# Patient Record
Sex: Female | Born: 1940 | Race: White | Hispanic: No | Marital: Single | Smoking: Former smoker
Health system: Southern US, Community
[De-identification: ages and names within clinical notes are randomized; demographics above are authoritative.]

## PROBLEM LIST (undated history)

## (undated) DIAGNOSIS — R51 Headache: Secondary | ICD-10-CM

## (undated) DIAGNOSIS — K7460 Unspecified cirrhosis of liver: Secondary | ICD-10-CM

## (undated) DIAGNOSIS — E119 Type 2 diabetes mellitus without complications: Secondary | ICD-10-CM

## (undated) DIAGNOSIS — J209 Acute bronchitis, unspecified: Secondary | ICD-10-CM

## (undated) DIAGNOSIS — R0600 Dyspnea, unspecified: Secondary | ICD-10-CM

## (undated) DIAGNOSIS — E669 Obesity, unspecified: Secondary | ICD-10-CM

## (undated) DIAGNOSIS — E1100 Type 2 diabetes mellitus with hyperosmolarity without nonketotic hyperglycemic-hyperosmolar coma (NKHHC): Secondary | ICD-10-CM

## (undated) DIAGNOSIS — D649 Anemia, unspecified: Secondary | ICD-10-CM

## (undated) DIAGNOSIS — R188 Other ascites: Secondary | ICD-10-CM

## (undated) DIAGNOSIS — I10 Essential (primary) hypertension: Secondary | ICD-10-CM

## (undated) DIAGNOSIS — K922 Gastrointestinal hemorrhage, unspecified: Secondary | ICD-10-CM

## (undated) DIAGNOSIS — A3790 Whooping cough, unspecified species without pneumonia: Secondary | ICD-10-CM

## (undated) DIAGNOSIS — K8020 Calculus of gallbladder without cholecystitis without obstruction: Secondary | ICD-10-CM

## (undated) DIAGNOSIS — I8500 Esophageal varices without bleeding: Secondary | ICD-10-CM

## (undated) DIAGNOSIS — R011 Cardiac murmur, unspecified: Secondary | ICD-10-CM

## (undated) DIAGNOSIS — I499 Cardiac arrhythmia, unspecified: Secondary | ICD-10-CM

## (undated) DIAGNOSIS — N179 Acute kidney failure, unspecified: Secondary | ICD-10-CM

## (undated) DIAGNOSIS — K2970 Gastritis, unspecified, without bleeding: Secondary | ICD-10-CM

## (undated) DIAGNOSIS — T7840XA Allergy, unspecified, initial encounter: Secondary | ICD-10-CM

## (undated) DIAGNOSIS — D126 Benign neoplasm of colon, unspecified: Secondary | ICD-10-CM

## (undated) DIAGNOSIS — Z5189 Encounter for other specified aftercare: Secondary | ICD-10-CM

## (undated) HISTORY — DX: Gastritis, unspecified, without bleeding: K29.70

## (undated) HISTORY — PX: COLONOSCOPY W/ BIOPSIES: SHX1374

## (undated) HISTORY — DX: Benign neoplasm of colon, unspecified: D12.6

## (undated) HISTORY — DX: Obesity, unspecified: E66.9

## (undated) HISTORY — DX: Esophageal varices without bleeding: I85.00

## (undated) HISTORY — DX: Cardiac murmur, unspecified: R01.1

## (undated) HISTORY — DX: Unspecified cirrhosis of liver: K74.60

## (undated) HISTORY — PX: VAGINAL HYSTERECTOMY: SUR661

## (undated) HISTORY — DX: Calculus of gallbladder without cholecystitis without obstruction: K80.20

## (undated) HISTORY — DX: Acute bronchitis, unspecified: J20.9

## (undated) HISTORY — DX: Anemia, unspecified: D64.9

## (undated) HISTORY — DX: Encounter for other specified aftercare: Z51.89

## (undated) HISTORY — DX: Allergy, unspecified, initial encounter: T78.40XA

## (undated) HISTORY — DX: Type 2 diabetes mellitus with hyperosmolarity without nonketotic hyperglycemic-hyperosmolar coma (NKHHC): E11.00

---

## 2009-03-04 DIAGNOSIS — A3790 Whooping cough, unspecified species without pneumonia: Secondary | ICD-10-CM

## 2009-03-04 HISTORY — DX: Whooping cough, unspecified species without pneumonia: A37.90

## 2013-03-04 DIAGNOSIS — N179 Acute kidney failure, unspecified: Secondary | ICD-10-CM

## 2013-03-04 HISTORY — DX: Acute kidney failure, unspecified: N17.9

## 2013-08-02 DIAGNOSIS — K7460 Unspecified cirrhosis of liver: Secondary | ICD-10-CM

## 2013-08-02 HISTORY — DX: Unspecified cirrhosis of liver: K74.60

## 2013-08-13 ENCOUNTER — Inpatient Hospital Stay (HOSPITAL_COMMUNITY)
Admission: AD | Admit: 2013-08-13 | Discharge: 2013-08-18 | DRG: 377 | Disposition: A | Discharge status: Home / Self Care | Payer: PRIVATE HEALTH INSURANCE | Source: Other Acute Inpatient Hospital | Attending: Internal Medicine | Admitting: Internal Medicine

## 2013-08-13 ENCOUNTER — Encounter (HOSPITAL_COMMUNITY): Payer: Self-pay | Admitting: Internal Medicine

## 2013-08-13 DIAGNOSIS — K922 Gastrointestinal hemorrhage, unspecified: Secondary | ICD-10-CM | POA: Diagnosis present

## 2013-08-13 DIAGNOSIS — M129 Arthropathy, unspecified: Secondary | ICD-10-CM | POA: Diagnosis present

## 2013-08-13 DIAGNOSIS — R7989 Other specified abnormal findings of blood chemistry: Secondary | ICD-10-CM | POA: Diagnosis present

## 2013-08-13 DIAGNOSIS — E872 Acidosis, unspecified: Secondary | ICD-10-CM | POA: Diagnosis present

## 2013-08-13 DIAGNOSIS — J189 Pneumonia, unspecified organism: Secondary | ICD-10-CM | POA: Diagnosis present

## 2013-08-13 DIAGNOSIS — E1165 Type 2 diabetes mellitus with hyperglycemia: Secondary | ICD-10-CM

## 2013-08-13 DIAGNOSIS — D72829 Elevated white blood cell count, unspecified: Secondary | ICD-10-CM | POA: Diagnosis present

## 2013-08-13 DIAGNOSIS — J42 Unspecified chronic bronchitis: Secondary | ICD-10-CM | POA: Diagnosis present

## 2013-08-13 DIAGNOSIS — IMO0001 Reserved for inherently not codable concepts without codable children: Secondary | ICD-10-CM | POA: Diagnosis present

## 2013-08-13 DIAGNOSIS — R3989 Other symptoms and signs involving the genitourinary system: Secondary | ICD-10-CM | POA: Diagnosis present

## 2013-08-13 DIAGNOSIS — K5289 Other specified noninfective gastroenteritis and colitis: Secondary | ICD-10-CM | POA: Diagnosis present

## 2013-08-13 DIAGNOSIS — I10 Essential (primary) hypertension: Secondary | ICD-10-CM | POA: Diagnosis present

## 2013-08-13 DIAGNOSIS — K921 Melena: Principal | ICD-10-CM

## 2013-08-13 DIAGNOSIS — D62 Acute posthemorrhagic anemia: Secondary | ICD-10-CM | POA: Diagnosis present

## 2013-08-13 DIAGNOSIS — E119 Type 2 diabetes mellitus without complications: Secondary | ICD-10-CM

## 2013-08-13 DIAGNOSIS — R945 Abnormal results of liver function studies: Secondary | ICD-10-CM

## 2013-08-13 DIAGNOSIS — E860 Dehydration: Secondary | ICD-10-CM | POA: Diagnosis present

## 2013-08-13 DIAGNOSIS — K529 Noninfective gastroenteritis and colitis, unspecified: Secondary | ICD-10-CM

## 2013-08-13 DIAGNOSIS — N179 Acute kidney failure, unspecified: Secondary | ICD-10-CM | POA: Diagnosis present

## 2013-08-13 DIAGNOSIS — R112 Nausea with vomiting, unspecified: Secondary | ICD-10-CM

## 2013-08-13 DIAGNOSIS — J45998 Other asthma: Secondary | ICD-10-CM | POA: Diagnosis present

## 2013-08-13 HISTORY — DX: Essential (primary) hypertension: I10

## 2013-08-13 HISTORY — DX: Cardiac arrhythmia, unspecified: I49.9

## 2013-08-13 HISTORY — DX: Type 2 diabetes mellitus without complications: E11.9

## 2013-08-13 HISTORY — DX: Headache: R51

## 2013-08-13 LAB — MAGNESIUM: Magnesium: 2 mg/dL (ref 1.5–2.5)

## 2013-08-13 LAB — URINALYSIS, ROUTINE W REFLEX MICROSCOPIC
Bilirubin Urine: NEGATIVE
Hgb urine dipstick: NEGATIVE
Ketones, ur: NEGATIVE mg/dL
Leukocytes, UA: NEGATIVE
Nitrite: NEGATIVE
Protein, ur: NEGATIVE mg/dL
SPECIFIC GRAVITY, URINE: 1.018 (ref 1.005–1.030)
Urobilinogen, UA: 0.2 mg/dL (ref 0.0–1.0)
pH: 5.5 (ref 5.0–8.0)

## 2013-08-13 LAB — COMPREHENSIVE METABOLIC PANEL
ALT: 262 U/L — AB (ref 0–35)
AST: 106 U/L — AB (ref 0–37)
Albumin: 2.8 g/dL — ABNORMAL LOW (ref 3.5–5.2)
Alkaline Phosphatase: 62 U/L (ref 39–117)
BUN: 79 mg/dL — ABNORMAL HIGH (ref 6–23)
CALCIUM: 9 mg/dL (ref 8.4–10.5)
CO2: 24 meq/L (ref 19–32)
Chloride: 93 mEq/L — ABNORMAL LOW (ref 96–112)
Creatinine, Ser: 1.32 mg/dL — ABNORMAL HIGH (ref 0.50–1.10)
GFR calc Af Amer: 46 mL/min — ABNORMAL LOW (ref >90)
GFR, EST NON AFRICAN AMERICAN: 40 mL/min — AB (ref >90)
Glucose, Bld: 368 mg/dL — ABNORMAL HIGH (ref 70–99)
Potassium: 4.9 mEq/L (ref 3.7–5.3)
SODIUM: 128 meq/L — AB (ref 137–147)
TOTAL PROTEIN: 5.3 g/dL — AB (ref 6.0–8.3)
Total Bilirubin: 0.6 mg/dL (ref 0.3–1.2)

## 2013-08-13 LAB — CBC
HEMATOCRIT: 26 % — AB (ref 36.0–46.0)
Hemoglobin: 8.7 g/dL — ABNORMAL LOW (ref 12.0–15.0)
MCH: 27.2 pg (ref 26.0–34.0)
MCHC: 33.5 g/dL (ref 30.0–36.0)
MCV: 81.3 fL (ref 78.0–100.0)
Platelets: 120 10*3/uL — ABNORMAL LOW (ref 150–400)
RBC: 3.2 MIL/uL — ABNORMAL LOW (ref 3.87–5.11)
RDW: 14.8 % (ref 11.5–15.5)
WBC: 18 10*3/uL — ABNORMAL HIGH (ref 4.0–10.5)

## 2013-08-13 LAB — TSH: TSH: 1.04 u[IU]/mL (ref 0.350–4.500)

## 2013-08-13 LAB — PROTIME-INR
INR: 1.4 (ref 0.00–1.49)
PROTHROMBIN TIME: 16.8 s — AB (ref 11.6–15.2)

## 2013-08-13 LAB — GLUCOSE, CAPILLARY: GLUCOSE-CAPILLARY: 381 mg/dL — AB (ref 70–99)

## 2013-08-13 LAB — URINE MICROSCOPIC-ADD ON

## 2013-08-13 LAB — MRSA PCR SCREENING: MRSA by PCR: NEGATIVE

## 2013-08-13 MED ORDER — MORPHINE SULFATE 2 MG/ML IJ SOLN
1.0000 mg | INTRAMUSCULAR | Status: DC | PRN
  Administered 2013-08-13 – 2013-08-14 (×2): 1 mg via INTRAVENOUS
  Filled 2013-08-13 (×2): qty 1

## 2013-08-13 MED ORDER — SODIUM CHLORIDE 0.9 % IV SOLN: INTRAVENOUS | Status: DC

## 2013-08-13 MED ORDER — ALBUTEROL SULFATE (2.5 MG/3ML) 0.083% IN NEBU: 2.5000 mg | INHALATION_SOLUTION | RESPIRATORY_TRACT | Status: DC | PRN

## 2013-08-13 MED ORDER — INSULIN ASPART 100 UNIT/ML ~~LOC~~ SOLN: 0.0000 [IU] | Freq: Three times a day (TID) | SUBCUTANEOUS | Status: DC

## 2013-08-13 MED ORDER — SODIUM CHLORIDE 0.9 % IJ SOLN
3.0000 mL | Freq: Two times a day (BID) | INTRAMUSCULAR | Status: DC
  Administered 2013-08-14 – 2013-08-16 (×3): 3 mL via INTRAVENOUS

## 2013-08-13 MED ORDER — ACETAMINOPHEN 325 MG PO TABS
650.0000 mg | ORAL_TABLET | Freq: Four times a day (QID) | ORAL | Status: DC | PRN
  Administered 2013-08-14 – 2013-08-15 (×5): 650 mg via ORAL
  Filled 2013-08-13 (×5): qty 2

## 2013-08-13 MED ORDER — ACETAMINOPHEN 650 MG RE SUPP: 650.0000 mg | Freq: Four times a day (QID) | RECTAL | Status: DC | PRN

## 2013-08-13 MED ORDER — ONDANSETRON HCL 4 MG/2ML IJ SOLN: 4.0000 mg | Freq: Four times a day (QID) | INTRAMUSCULAR | Status: DC | PRN

## 2013-08-13 MED ORDER — INSULIN ASPART 100 UNIT/ML ~~LOC~~ SOLN
0.0000 [IU] | SUBCUTANEOUS | Status: DC
  Administered 2013-08-13 – 2013-08-14 (×2): 15 [IU] via SUBCUTANEOUS
  Administered 2013-08-14: 8 [IU] via SUBCUTANEOUS
  Administered 2013-08-14: 15 [IU] via SUBCUTANEOUS
  Administered 2013-08-14 (×2): 11 [IU] via SUBCUTANEOUS
  Administered 2013-08-15 – 2013-08-16 (×5): 15 [IU] via SUBCUTANEOUS
  Administered 2013-08-16: 11 [IU] via SUBCUTANEOUS
  Administered 2013-08-16: 8 [IU] via SUBCUTANEOUS
  Administered 2013-08-16: 15 [IU] via SUBCUTANEOUS
  Administered 2013-08-16: 11 [IU] via SUBCUTANEOUS
  Administered 2013-08-16: 15 [IU] via SUBCUTANEOUS
  Administered 2013-08-16: 5 [IU] via SUBCUTANEOUS
  Administered 2013-08-17: 8 [IU] via SUBCUTANEOUS
  Administered 2013-08-17 (×2): 5 [IU] via SUBCUTANEOUS
  Administered 2013-08-17: 8 [IU] via SUBCUTANEOUS
  Administered 2013-08-17: 5 [IU] via SUBCUTANEOUS

## 2013-08-13 MED ORDER — ALPRAZOLAM 0.25 MG PO TABS: 0.2500 mg | ORAL_TABLET | Freq: Every evening | ORAL | Status: DC | PRN

## 2013-08-13 MED ORDER — SODIUM CHLORIDE 0.9 % IV SOLN
INTRAVENOUS | Status: DC
  Administered 2013-08-13 – 2013-08-14 (×2): via INTRAVENOUS

## 2013-08-13 MED ORDER — ONDANSETRON HCL 4 MG PO TABS: 4.0000 mg | ORAL_TABLET | Freq: Four times a day (QID) | ORAL | Status: DC | PRN

## 2013-08-13 MED ORDER — IPRATROPIUM BROMIDE 0.02 % IN SOLN: 0.5000 mg | Freq: Four times a day (QID) | RESPIRATORY_TRACT | Status: DC | PRN

## 2013-08-13 NOTE — Consult Note (Addendum)
GI Consultation  Referring Provider: No ref. provider found Primary Care Physician:  No PCP Per Patient Primary Gastroenterologist:  Dr.  Luiz Iron for Consultation:  *GI bleed**  HPI: Cynthia Long is a 73 y.o. female *history of diabetes, hypertension transferred from [facility] treatment of GI bleeding.  Last night she developed severe nausea vomiting and diarrhea.  Diarrhea with black.  She vomited black material twice.  Diarrhea has subsided but she developed severe weakness and some shortness of breath.  At [facility] she is noted to be anemic, dehydrated and acidotic.  He received 2 units of packed cells.  She apparently had a similar but much less severe episode of nausea vomiting and black diarrhea about one month ago.  About a week ago she was started on prednisone for a chronic cough.  She's on no gastric irritants including nonsteroidals.  There is no history of peptic ulcer disease.  She denies dysphagia, pyrosis, abdominal pain or rectal bleeding.    That is pertinent for white count 28.39, hemoglobin 7.5, MCV 80, sodium 125 CO2 19 BUN 85 creatinine 2.83 AST 128 ALT 279 lipase normal and lactic acid 6.4**.  CT scan, which I reviewed, did not demonstrate any acute abnormalities.   No past medical history on file.  No past surgical history on file.  Prior to Admission medications   Not on File    No current facility-administered medications for this encounter.    Allergies as of 08/13/2013  . (Not on File)    No family history on file.  History   Social History  . Marital Status: Married    Spouse Name: N/A    Number of Children: N/A  . Years of Education: N/A   Occupational History  . Not on file.   Social History Main Topics  . Smoking status: Not on file  . Smokeless tobacco: Not on file  . Alcohol Use: Not on file  . Drug Use: Not on file  . Sexual Activity: Not on file   Other Topics Concern  . Not on file   Social History  Narrative  . No narrative on file    Review of Systems: Pertinent positive and negative review of systems were noted in the above history of present illness section. All other review of systems were otherwise negative   Physical Exam: Vital signs in last 24 hours: Temp:  [98.1 F (36.7 C)] 98.1 F (36.7 C) (06/12 1646) Pulse Rate:  [99] 99 (06/12 1646) Resp:  [13] 13 (06/12 1646) BP: (146)/(36) 146/36 mmHg (06/12 1646) SpO2:  [98 %] 98 % (06/12 1646) Weight:  [146 lb 13.2 oz (66.6 kg)] 146 lb 13.2 oz (66.6 kg) (06/12 1646)  General:   Ill-appearing female alert and cooperative cooperative  Head:  Normocephalic and atraumatic. Eyes:  Sclera clear, no icterus.   Conjunctiva pink. Ears:  Normal auditory acuity. Nose:  No deformity, discharge,  or lesions. Mouth:  No deformity or lesions.  Dry mucous membranes Neck:  Supple; no masses or thyromegaly. Lungs:  Clear throughout to auscultation.   No wheezes, crackles, or rhonchi. No acute distress. Heart:  Regular rate and rhythm; no murmurs, clicks, rubs,  or gallops. Abdomen:  Soft, nontender and nondistended. No masses, hepatosplenomegaly or hernias noted. Normal bowel sounds, without guarding, and without rebound.   Rectal:  Deferred    Msk:  Symmetrical without gross deformities. Normal posture. Pulses:  Normal pulses noted. Extremities:  Without  clubbing or edema. Neurologic:  Alert and  oriented x4;  grossly normal neurologically. Skin:  Intact without significant lesions or rashes. Cervical Nodes:  No significant cervical adenopathy. Psych:  Alert and cooperative. Normal mood and affect.  Intake/Output from previous day:   Intake/Output this shift: Total I/O In: -  Out: 500 [Urine:500]  Lab Results: No results found for this basename: WBC, HGB, HCT, PLT,  in the last 72 hours BMET No results found for this basename: NA, K, CL, CO2, GLUCOSE, BUN, CREATININE, CALCIUM,  in the last 72 hours LFT No results found for this  basename: PROT, ALBUMIN, AST, ALT, ALKPHOS, BILITOT, BILIDIR, IBILI,  in the last 72 hours PT/INR No results found for this basename: LABPROT, INR,  in the last 72 hours Hepatitis Panel No results found for this basename: HEPBSAG, HCVAB, HEPAIGM, HEPBIGM,  in the last 72 hours Additional Labs   Studies/Results: No results found.  Active Problems:   * No active hospital problems. *     IMPRESSION:  1.  acute GI bleed.  Probable upper GI source in view of history of vomiting black material, melena and elevated BUN.  Rule out active peptic ulcer disease, Mallory-Weiss tear. 2.  leukocytosis.  Patient does not look clinically infected, exam is benign and CT is unremarkable.  This could reflect stress, possible steroid effect.  Occult infection must be considered.  Doubt mesenteric ischemia in the absence of abdominal pain. 3.  abnormal liver tests.  This could be related to shock liver..Doubt acute biliary tract disease. 4.  lactic acidosis - rule out DKA, sepsis 5.  Dehydration 6.  renal insufficiency-suspect prerenal azotemia 7.  diabetes     PLAN:    1.  keep hemoglobin between 8 and 9 2.  PPI drip or twice a day 3.  EGD - scheduled for a.m. 4.  repeat lipase and check amylase levels; followup LFTs           Sandy Salaam. Deatra Ina, MD, [facility] [phone]    08/13/2013, 5:41 PM

## 2013-08-13 NOTE — Progress Notes (Signed)
Patient received from [facility].  On arrival 1 unit of RBC's complete and NS at [facility].  Admitting MD notified of patients arrival.

## 2013-08-13 NOTE — H&P (Signed)
[facility] History and Physical  Gabrella Long CVE:[id number] DOB: 08-25-1940 DOA: 08/13/2013  Referring physician: EDP PCP: No PCP Per Patient   Chief Complaint:   HPI: Cynthia Long is a 73 y.o. female with a PMH significant for HTN, type 2 diabetes and chronic allergic bronchitis who was tranferred from [facility] to [facility] for management of possible upper GI bleed. Patient gives a history of vomiting dark black material last evening followed by multiple episodes of diarrhea and melena. Symptoms associated with nausea. She has never had GIB prior to this episode, no sick contacts, she does endorse taking Ibuprofen several times a week for HA and arthritis, she is not on any anticogulation. Sghe denies having a PCP but sees an endocrinologist for her DM, a pulmonologist for her allergies and [place] cardi[place]. Minimal PO intake in the past 24 hours. Currently in stepdown, no complaints other than HA, no vomiting, her BP is in a normal range and other vitals are normal. Labs from [facility] indicate a hemoglobin of 7.5 a creatinine of 2.2 and elevated liver function tests.  GI plans to do endoscopy in AM.  Review of Systems:  Constitutional:  No weight loss, night sweats, Fevers, chills, fatigue.  HEENT:  + headaches, Difficulty swallowing,Tooth/dental problems,Sore throat,  No sneezing, itching, ear ache, nasal congestion, post nasal drip,  Cardio-vascular:  No chest pain, Orthopnea, PND, swelling in lower extremities, anasarca, dizziness, palpitations  GI:  No heartburn, indigestion, abdominal pain, nausea, vomiting, diarrhea, change in bowel habits, loss of appetite  Resp:  No shortness of breath with exertion or at rest. No excess mucus, no productive cough, No non-productive cough, No coughing up of blood.No change in color of mucus.No wheezing.No chest wall deformity  Skin:  no rash or lesions.  GU:  no dysuria, change in color of urine,  no urgency or frequency. No flank pain.  Musculoskeletal:  No joint pain or swelling. No decreased range of motion. No back pain.  Psych:  No change in mood or affect. No depression or anxiety. No memory loss.   Past Medical History  Diagnosis Date  . Hypertension   . Dysrhythmia   . Diabetes mellitus without complication   . WCHENIDP(824.2)    Past Surgical History  Procedure Laterality Date  . No past surgeries     Social History:  reports that she has never smoked. She has never used smokeless tobacco. She reports that she does not drink alcohol or use illicit drugs.  No Known Allergies  Family History  Problem Relation Age of Onset  . Family history unknown: Yes     Prior to Admission medications   Medication Sig Start Date End Date Taking? Authorizing Provider  aspirin EC 81 MG tablet Take 81 mg by mouth daily.   Yes Historical Provider, MD  diltiazem (DILACOR XR) 120 MG 24 hr capsule Take 120 mg by mouth daily.   Yes Historical Provider, MD  glimepiride (AMARYL) 4 MG tablet Take 4 mg by mouth 2 (two) times daily.   Yes Historical Provider, MD  losartan-hydrochlorothiazide (HYZAAR) 100-12.5 MG per tablet Take 1 tablet by mouth daily.   Yes Historical Provider, MD  Potassium Gluconate 550 MG TABS Take 550 mg by mouth daily.   Yes Historical Provider, MD  predniSONE (STERAPRED UNI-PAK) 5 MG TABS tablet Take 5-10 mg by mouth See admin instructions. 10 mg twos times daily for two weeks then;  5mg  tab twice daily for 2  weeks ; then 5mg  tab daily for two weeks   Yes Historical Provider, MD  Saxagliptin-Metformin (KOMBIGLYZE XR) 07-998 MG TB24 Take 1 tablet by mouth daily.   Yes Historical Provider, MD   Physical Exam: Filed Vitals:   08/14/13 0051  BP:   Pulse:   Temp: 98.8 F (37.1 C)  Resp:     BP 135/29  Pulse 90  Temp(Src) 98.8 F (37.1 C) (Oral)  Resp 17  Ht 5\' 3"  (1.6 m)  Wt 66.6 kg (146 lb 13.2 oz)  BMI 26.02 kg/m2  SpO2 98%  General:  Appears calm and  comfortable, pale Eyes: PERRL, normal lids, irises & conjunctiva ENT: grossly normal hearing, lips & tongue Neck: no LAD, masses or thyromegaly Cardiovascular: RRR, no m/r/g. Trace LE edema. Telemetry: SR, no arrhythmias  Respiratory: CTA bilaterally, no w/r/r. Normal respiratory effort. Abdomen: soft, ntnd Skin: no rash or induration seen on limited exam Musculoskeletal: grossly normal tone BUE/BLE Psychiatric: grossly normal mood and affect, speech fluent and appropriate Neurologic: grossly non-focal.          Reviewed all records from [facility].   Assessment/Plan Principal Problem:   GIB (gastrointestinal bleeding) Active Problems:   Acute posthemorrhagic anemia   Blood in stool   Nonspecific abnormal results of liver function study   Lactic acidosis   Nausea with vomiting   Type II or unspecified type diabetes mellitus without mention of complication, not stated as uncontrolled   HTN (hypertension)   Chronic allergic bronchitis   Acute renal failure   Patient in stepdown unit from direct transfer from [place] signs are stable and she has not had any additional vomiting or bloody stools  Have started IV hydration -she appears dehydrated with acute renal failure and elevated lactic acid  Symptomatic management with antinausea medication  N.p.o.  She will have endoscopy in the morning with Dr. Armandina Gemma monitor CBC q8 and trend her Hb, no need for transfusion currently  SSI for her DM  Holding her home BP meds for now   GI consult [name] following.  Code Status: Full Code Family Communication: Patient only Disposition Plan: LOS depends on outcome of endoscopy tomorrow suspect she will be able to discharge in 1-2 days. Time spent: 70 minutes  [facility] Pager [phone]  **Disclaimer: This note may have been dictated with voice recognition software. Similar sounding words can inadvertently be  transcribed and this note may contain transcription errors which may not have been corrected upon publication of note.**

## 2013-08-14 ENCOUNTER — Inpatient Hospital Stay (HOSPITAL_COMMUNITY): Payer: PRIVATE HEALTH INSURANCE

## 2013-08-14 ENCOUNTER — Encounter (HOSPITAL_COMMUNITY)
Admission: AD | Disposition: A | Discharge status: Home / Self Care | Payer: Self-pay | Source: Other Acute Inpatient Hospital | Attending: Internal Medicine

## 2013-08-14 ENCOUNTER — Encounter (HOSPITAL_COMMUNITY): Payer: Self-pay | Admitting: *Deleted

## 2013-08-14 DIAGNOSIS — N179 Acute kidney failure, unspecified: Secondary | ICD-10-CM | POA: Diagnosis present

## 2013-08-14 DIAGNOSIS — K921 Melena: Principal | ICD-10-CM

## 2013-08-14 HISTORY — PX: ESOPHAGOGASTRODUODENOSCOPY: SHX5428

## 2013-08-14 LAB — CBC
HEMATOCRIT: 24.9 % — AB (ref 36.0–46.0)
Hemoglobin: 8.4 g/dL — ABNORMAL LOW (ref 12.0–15.0)
MCH: 27.5 pg (ref 26.0–34.0)
MCHC: 33.7 g/dL (ref 30.0–36.0)
MCV: 81.6 fL (ref 78.0–100.0)
Platelets: 120 10*3/uL — ABNORMAL LOW (ref 150–400)
RBC: 3.05 MIL/uL — ABNORMAL LOW (ref 3.87–5.11)
RDW: 15 % (ref 11.5–15.5)
WBC: 16 10*3/uL — ABNORMAL HIGH (ref 4.0–10.5)

## 2013-08-14 LAB — URINE MICROSCOPIC-ADD ON

## 2013-08-14 LAB — GLUCOSE, CAPILLARY
GLUCOSE-CAPILLARY: 398 mg/dL — AB (ref 70–99)
Glucose-Capillary: 271 mg/dL — ABNORMAL HIGH (ref 70–99)
Glucose-Capillary: 310 mg/dL — ABNORMAL HIGH (ref 70–99)
Glucose-Capillary: 341 mg/dL — ABNORMAL HIGH (ref 70–99)
Glucose-Capillary: 366 mg/dL — ABNORMAL HIGH (ref 70–99)
Glucose-Capillary: 418 mg/dL — ABNORMAL HIGH (ref 70–99)

## 2013-08-14 LAB — HEMOGLOBIN A1C
Hgb A1c MFr Bld: 6.9 % — ABNORMAL HIGH (ref <5.7)
Mean Plasma Glucose: 151 mg/dL — ABNORMAL HIGH (ref <117)

## 2013-08-14 LAB — URINALYSIS, ROUTINE W REFLEX MICROSCOPIC
Bilirubin Urine: NEGATIVE
Glucose, UA: >1000 mg/dL — AB
HGB URINE DIPSTICK: NEGATIVE
Ketones, ur: NEGATIVE mg/dL
NITRITE: NEGATIVE
Protein, ur: NEGATIVE mg/dL
SPECIFIC GRAVITY, URINE: 1.024 (ref 1.005–1.030)
UROBILINOGEN UA: 0.2 mg/dL (ref 0.0–1.0)
pH: 6 (ref 5.0–8.0)

## 2013-08-14 SURGERY — EGD (ESOPHAGOGASTRODUODENOSCOPY)
Anesthesia: Moderate Sedation

## 2013-08-14 MED ORDER — MIDAZOLAM HCL 10 MG/2ML IJ SOLN
INTRAMUSCULAR | Status: DC | PRN
  Administered 2013-08-14 (×2): 2 mg via INTRAVENOUS

## 2013-08-14 MED ORDER — IPRATROPIUM-ALBUTEROL 0.5-2.5 (3) MG/3ML IN SOLN: 3.0000 mL | RESPIRATORY_TRACT | Status: DC | PRN

## 2013-08-14 MED ORDER — SPOT INK MARKER SYRINGE KIT
PACK | SUBMUCOSAL | Status: AC
  Filled 2013-08-14: qty 5

## 2013-08-14 MED ORDER — METRONIDAZOLE IN NACL 5-0.79 MG/ML-% IV SOLN
500.0000 mg | Freq: Three times a day (TID) | INTRAVENOUS | Status: DC
  Administered 2013-08-14 – 2013-08-17 (×9): 500 mg via INTRAVENOUS
  Filled 2013-08-14 (×10): qty 100

## 2013-08-14 MED ORDER — IPRATROPIUM-ALBUTEROL 0.5-2.5 (3) MG/3ML IN SOLN
3.0000 mL | Freq: Two times a day (BID) | RESPIRATORY_TRACT | Status: DC
  Administered 2013-08-15 – 2013-08-16 (×3): 3 mL via RESPIRATORY_TRACT
  Filled 2013-08-14 (×3): qty 3

## 2013-08-14 MED ORDER — INSULIN ASPART 100 UNIT/ML ~~LOC~~ SOLN
6.0000 [IU] | Freq: Once | SUBCUTANEOUS | Status: AC
  Administered 2013-08-14: 6 [IU] via SUBCUTANEOUS

## 2013-08-14 MED ORDER — BIOTENE DRY MOUTH MT LIQD
15.0000 mL | Freq: Two times a day (BID) | OROMUCOSAL | Status: DC
  Administered 2013-08-14 – 2013-08-18 (×8): 15 mL via OROMUCOSAL

## 2013-08-14 MED ORDER — IPRATROPIUM-ALBUTEROL 0.5-2.5 (3) MG/3ML IN SOLN
3.0000 mL | RESPIRATORY_TRACT | Status: DC
  Administered 2013-08-14 (×2): 3 mL via RESPIRATORY_TRACT
  Filled 2013-08-14 (×13): qty 3

## 2013-08-14 MED ORDER — PANTOPRAZOLE SODIUM 40 MG IV SOLR
40.0000 mg | Freq: Two times a day (BID) | INTRAVENOUS | Status: DC
  Administered 2013-08-14 – 2013-08-16 (×6): 40 mg via INTRAVENOUS
  Filled 2013-08-14 (×8): qty 40

## 2013-08-14 MED ORDER — MIDAZOLAM HCL 10 MG/2ML IJ SOLN
INTRAMUSCULAR | Status: AC
  Filled 2013-08-14: qty 4

## 2013-08-14 MED ORDER — FENTANYL CITRATE 0.05 MG/ML IJ SOLN
INTRAMUSCULAR | Status: DC | PRN
  Administered 2013-08-14 (×2): 25 ug via INTRAVENOUS

## 2013-08-14 MED ORDER — FENTANYL CITRATE 0.05 MG/ML IJ SOLN
INTRAMUSCULAR | Status: AC
  Filled 2013-08-14: qty 2

## 2013-08-14 MED ORDER — BUTAMBEN-TETRACAINE-BENZOCAINE 2-2-14 % EX AERO
INHALATION_SPRAY | CUTANEOUS | Status: DC | PRN
  Administered 2013-08-14: 2 via TOPICAL

## 2013-08-14 MED ORDER — CIPROFLOXACIN IN D5W 400 MG/200ML IV SOLN
400.0000 mg | Freq: Two times a day (BID) | INTRAVENOUS | Status: DC
  Administered 2013-08-14 – 2013-08-17 (×7): 400 mg via INTRAVENOUS
  Filled 2013-08-14 (×7): qty 200

## 2013-08-14 MED ORDER — KCL IN DEXTROSE-NACL 10-5-0.45 MEQ/L-%-% IV SOLN
INTRAVENOUS | Status: DC
  Administered 2013-08-14 – 2013-08-15 (×3): via INTRAVENOUS
  Filled 2013-08-14 (×3): qty 1000

## 2013-08-14 NOTE — Progress Notes (Signed)
Patient ID: Raedyn Klinck, female   DOB: [birth year], 73 y.o.   MRN: [id number] [facility] PROGRESS NOTE  Mekia Dipinto SWN:[id number] DOB: Aug 14, 1940 DOA: 08/13/2013 PCP: No PCP Per Patient  Brief narrative: 73 y.o. female with a PMH significant for HTN, type 2 diabetes and chronic allergic bronchitis who was tranferred from [facility] to [facility] for management of upper GI bleed. Patient gives a history of vomiting dark black material an evening prior to this admission, followed by multiple episodes of diarrhea and melena. Symptoms associated with nausea. She has never had GIB prior to this episode, no sick contacts, she does endorse taking Ibuprofen several times a week for HA and arthritis, she is not on any anticogulation. She denies having a PCP but sees an endocrinologist for her DM, a pulmonologist for her allergies and [place]. Labs from [facility] indicate a hemoglobin of 7.5 a creatinine of 2.2 and elevated liver function tests.   Principal Problem:   GIB (gastrointestinal bleeding) - appreciate GI input, plan for endoscopy in AM - Hg trend since admission: 8.7 --> 8.4 - follow up on GI recommendations, continue PPI BID  - repeat CBC in AM - provide SCD's for DVT prophylaxis Active Problems:   Nausea and vomiting - unclear if related to NSAID use, will follow upon endoscopy findings - provide antiemetics as needed    Nonspecific abnormal results of liver function study - unclear etiology, obtain acute hep panel, repeat CMET in AM   Leukocytosis - unclear etiology - no clear infectious source identified - will obtain CXR and UA for further evaluation  - repeat CBC in AM   Type II diabetes mellitus - place on SSI until PO intake improves    HTN (hypertension) - reasonably well controlled    Chronic allergic bronchitis - supportive care    Acute renal failure - likely secondary to pre renal etiology - Cr on admission 1.32,  repeat electrolyte panel in AM  Consultants:  GI  Procedures/Studies:  EGD 6/13 -->   Antibiotics:  None  Code Status: Full Family Communication: Pt at bedside Disposition Plan: Remains inpatient   HPI/Subjective: No events overnight.   Objective: Filed Vitals:   08/14/13 0051 08/14/13 0441 08/14/13 0733 08/14/13 1208  BP:  146/32 143/34 147/39  Pulse:   96 105  Temp: 98.8 F (37.1 C) 97.9 F (36.6 C) 98.2 F (36.8 C) 98.4 F (36.9 C)  TempSrc: Oral Oral Oral Oral  Resp:   14 18  Height:      Weight:  66.3 kg (146 lb 2.6 oz)    SpO2:   100% 97%    Intake/Output Summary (Last 24 hours) at 08/14/13 1230 Last data filed at 08/14/13 1050  Gross per 24 hour  Intake 1241.67 ml  Output   2875 ml  Net -1633.33 ml    Exam:   General:  Pt is alert, follows commands appropriately, not in acute distress  Cardiovascular: Regular rhythm, tachycardic, no rubs, no gallops  Respiratory: Clear to auscultation bilaterally, no wheezing, no crackles, no rhonchi  Abdomen: Soft, non tender, non distended, bowel sounds present, no guarding  Extremities: No edema, pulses DP and PT palpable bilaterally  Neuro: Grossly nonfocal  Data Reviewed: Basic Metabolic Panel:  Recent Labs Lab 08/13/13 2014  NA 128*  K 4.9  CL 93*  CO2 24  GLUCOSE 368*  BUN 79*  CREATININE 1.32*  CALCIUM 9.0  MG 2.0   Liver Function  Tests:  Recent Labs Lab 08/13/13 2014  AST 106*  ALT 262*  ALKPHOS 62  BILITOT 0.6  PROT 5.3*  ALBUMIN 2.8*   CBC:  Recent Labs Lab 08/13/13 2014 08/14/13 0313  WBC 18.0* 16.0*  HGB 8.7* 8.4*  HCT 26.0* 24.9*  MCV 81.3 81.6  PLT 120* 120*   CBG:  Recent Labs Lab 08/13/13 2034 08/14/13 0001 08/14/13 0437 08/14/13 0738  GLUCAP 381* 366* 341* 310*    Recent Results (from the past 240 hour(s))  MRSA PCR SCREENING     Status: None   Collection Time    08/13/13  4:15 PM      Result Value Ref Range Status   MRSA by PCR NEGATIVE   NEGATIVE Final   Comment:            The GeneXpert MRSA Assay (FDA     approved for NASAL specimens     only), is one component of a     comprehensive MRSA colonization     surveillance program. It is not     intended to diagnose MRSA     infection nor to guide or     monitor treatment for     MRSA infections.     Scheduled Meds: . antiseptic oral rinse  15 mL Mouth Rinse BID  . insulin aspart  0-15 Units Subcutaneous 6 times per day  . sodium chloride  3 mL Intravenous Q12H   Continuous Infusions: . sodium chloride    . sodium chloride 100 mL/hr at 08/14/13 7116     Faye Ramsay, MD  [facility] Pager [phone]  If 7PM-7AM, please contact night-coverage [email or web address] Password [email or web address] 08/14/2013, 12:30 PM   LOS: 1 day

## 2013-08-14 NOTE — Progress Notes (Signed)
Recd pt from ICU, no change from AM assessment.  Pt states headache is a little better, just hungry

## 2013-08-14 NOTE — H&P (View-Only) (Signed)
GI Consultation  Referring Provider: No ref. provider found Primary Care Physician:  No PCP Per Patient Primary Gastroenterologist:  Dr.  Luiz Iron for Consultation:  *GI bleed**  HPI: Cynthia Long is a 73 y.o. female *history of diabetes, hypertension transferred from [facility] treatment of GI bleeding.  Last night she developed severe nausea vomiting and diarrhea.  Diarrhea with black.  She vomited black material twice.  Diarrhea has subsided but she developed severe weakness and some shortness of breath.  At [facility] she is noted to be anemic, dehydrated and acidotic.  He received 2 units of packed cells.  She apparently had a similar but much less severe episode of nausea vomiting and black diarrhea about one month ago.  About a week ago she was started on prednisone for a chronic cough.  She's on no gastric irritants including nonsteroidals.  There is no history of peptic ulcer disease.  She denies dysphagia, pyrosis, abdominal pain or rectal bleeding.    That is pertinent for white count 28.39, hemoglobin 7.5, MCV 80, sodium 125 CO2 19 BUN 85 creatinine 2.83 AST 128 ALT 279 lipase normal and lactic acid 6.4**.  CT scan, which I reviewed, did not demonstrate any acute abnormalities.   No past medical history on file.  No past surgical history on file.  Prior to Admission medications   Not on File    No current facility-administered medications for this encounter.    Allergies as of 08/13/2013  . (Not on File)    No family history on file.  History   Social History  . Marital Status: Married    Spouse Name: N/A    Number of Children: N/A  . Years of Education: N/A   Occupational History  . Not on file.   Social History Main Topics  . Smoking status: Not on file  . Smokeless tobacco: Not on file  . Alcohol Use: Not on file  . Drug Use: Not on file  . Sexual Activity: Not on file   Other Topics Concern  . Not on file   Social History  Narrative  . No narrative on file    Review of Systems: Pertinent positive and negative review of systems were noted in the above history of present illness section. All other review of systems were otherwise negative   Physical Exam: Vital signs in last 24 hours: Temp:  [98.1 F (36.7 C)] 98.1 F (36.7 C) (06/12 1646) Pulse Rate:  [99] 99 (06/12 1646) Resp:  [13] 13 (06/12 1646) BP: (146)/(36) 146/36 mmHg (06/12 1646) SpO2:  [98 %] 98 % (06/12 1646) Weight:  [146 lb 13.2 oz (66.6 kg)] 146 lb 13.2 oz (66.6 kg) (06/12 1646)  General:   Ill-appearing female alert and cooperative cooperative  Head:  Normocephalic and atraumatic. Eyes:  Sclera clear, no icterus.   Conjunctiva pink. Ears:  Normal auditory acuity. Nose:  No deformity, discharge,  or lesions. Mouth:  No deformity or lesions.  Dry mucous membranes Neck:  Supple; no masses or thyromegaly. Lungs:  Clear throughout to auscultation.   No wheezes, crackles, or rhonchi. No acute distress. Heart:  Regular rate and rhythm; no murmurs, clicks, rubs,  or gallops. Abdomen:  Soft, nontender and nondistended. No masses, hepatosplenomegaly or hernias noted. Normal bowel sounds, without guarding, and without rebound.   Rectal:  Deferred    Msk:  Symmetrical without gross deformities. Normal posture. Pulses:  Normal pulses noted. Extremities:  Without  clubbing or edema. Neurologic:  Alert and  oriented x4;  grossly normal neurologically. Skin:  Intact without significant lesions or rashes. Cervical Nodes:  No significant cervical adenopathy. Psych:  Alert and cooperative. Normal mood and affect.  Intake/Output from previous day:   Intake/Output this shift: Total I/O In: -  Out: 500 [Urine:500]  Lab Results: No results found for this basename: WBC, HGB, HCT, PLT,  in the last 72 hours BMET No results found for this basename: NA, K, CL, CO2, GLUCOSE, BUN, CREATININE, CALCIUM,  in the last 72 hours LFT No results found for this  basename: PROT, ALBUMIN, AST, ALT, ALKPHOS, BILITOT, BILIDIR, IBILI,  in the last 72 hours PT/INR No results found for this basename: LABPROT, INR,  in the last 72 hours Hepatitis Panel No results found for this basename: HEPBSAG, HCVAB, HEPAIGM, HEPBIGM,  in the last 72 hours Additional Labs   Studies/Results: No results found.  Active Problems:   * No active hospital problems. *     IMPRESSION:  1.  acute GI bleed.  Probable upper GI source in view of history of vomiting black material, melena and elevated BUN.  Rule out active peptic ulcer disease, Mallory-Weiss tear. 2.  leukocytosis.  Patient does not look clinically infected, exam is benign and CT is unremarkable.  This could reflect stress, possible steroid effect.  Occult infection must be considered.  Doubt mesenteric ischemia in the absence of abdominal pain. 3.  abnormal liver tests.  This could be related to shock liver..Doubt acute biliary tract disease. 4.  lactic acidosis - rule out DKA, sepsis 5.  Dehydration 6.  renal insufficiency-suspect prerenal azotemia 7.  diabetes     PLAN:    1.  keep hemoglobin between 8 and 9 2.  PPI drip or twice a day 3.  EGD - scheduled for a.m. 4.  repeat lipase and check amylase levels; followup LFTs           Sandy Salaam. Deatra Ina, MD, [facility] [phone]    08/13/2013, 5:41 PM

## 2013-08-14 NOTE — Op Note (Signed)
[facility] [place], [address]   ENDOSCOPY PROCEDURE REPORT  PATIENT: Cynthia Long, Cynthia Long  MR#: [id number] BIRTHDATE: 07/14/1940 , 17  yrs. old GENDER: Female ENDOSCOPIST: Inda Castle, MD REFERRED BY: PROCEDURE DATE:  08/14/2013 PROCEDURE:  EGD, diagnostic ASA CLASS:     Class III INDICATIONS: MEDICATIONS: These medications were titrated to patient response per physician's verbal order, Versed 4 mg IV, and Fentanyl 50 mcg IV TOPICAL ANESTHETIC: Cetacaine Spray  DESCRIPTION OF PROCEDURE: After the risks benefits and alternatives of the procedure were thoroughly explained, informed consent was obtained.  The [place] [id number] endoscope was introduced through the mouth and advanced to the third portion of the duodenum. Without limitations.  The instrument was slowly withdrawn as the mucosa was fully examined.      The upper, middle and distal third of the esophagus were carefully inspected and no abnormalities were noted.  The z-line was well seen at the GEJ.  The endoscope was pushed into the fundus which was normal including a retroflexed view.  The antrum, gastric body, first and second part of the duodenum were unremarkable. Retroflexed views revealed no abnormalities.     The scope was then withdrawn from the patient and the procedure completed.  COMPLICATIONS: There were no complications. ENDOSCOPIC IMPRESSION: Normal EGD  RECOMMENDATIONS: Stool C&S , then empiric Rx with cipro/flagyl t/c colonoscopy pending above REPEAT EXAM:  eSigned:  Inda Castle, MD 08/14/2013 1:02 PM   CC:

## 2013-08-14 NOTE — Progress Notes (Signed)
EGD is entirely normal.  No source for GI bleeding. This is beginning to look like an enteric infection/colitis.  Recommendations 1.  Stool pathogen panel 2.  Empiric Rx with cipro/flagyl 3.  T/c colonoscopoy pending results of above 4.  Needs more vigorous IV hydration

## 2013-08-14 NOTE — Interval H&P Note (Signed)
History and Physical Interval Note:  08/14/2013 12:43 PM  Cynthia Long  has presented today for surgery, with the diagnosis of GI bleed  The various methods of treatment have been discussed with the patient and family. After consideration of risks, benefits and other options for treatment, the patient has consented to  Procedure(s): ESOPHAGOGASTRODUODENOSCOPY (EGD) (N/A) as a surgical intervention .  The patient's history has been reviewed, patient examined, no change in status, stable for surgery.  I have reviewed the patient's chart and labs.  Questions were answered to the patient's satisfaction.    The recent H&P (dated *08/13/13**) was reviewed, the patient was examined and there is no change in the patients condition since that H&P was completed.   Erskine Emery  08/14/2013, 12:43 PM    Erskine Emery

## 2013-08-15 DIAGNOSIS — D62 Acute posthemorrhagic anemia: Secondary | ICD-10-CM

## 2013-08-15 DIAGNOSIS — K922 Gastrointestinal hemorrhage, unspecified: Secondary | ICD-10-CM

## 2013-08-15 DIAGNOSIS — N179 Acute kidney failure, unspecified: Secondary | ICD-10-CM

## 2013-08-15 LAB — CBC
HCT: 20.8 % — ABNORMAL LOW (ref 36.0–46.0)
HEMOGLOBIN: 6.9 g/dL — AB (ref 12.0–15.0)
MCH: 27.5 pg (ref 26.0–34.0)
MCHC: 33.2 g/dL (ref 30.0–36.0)
MCV: 82.9 fL (ref 78.0–100.0)
Platelets: 96 10*3/uL — ABNORMAL LOW (ref 150–400)
RBC: 2.51 MIL/uL — AB (ref 3.87–5.11)
RDW: 15.3 % (ref 11.5–15.5)
WBC: 12.3 10*3/uL — ABNORMAL HIGH (ref 4.0–10.5)

## 2013-08-15 LAB — COMPREHENSIVE METABOLIC PANEL
ALK PHOS: 56 U/L (ref 39–117)
ALT: 225 U/L — AB (ref 0–35)
AST: 66 U/L — ABNORMAL HIGH (ref 0–37)
Albumin: 2.7 g/dL — ABNORMAL LOW (ref 3.5–5.2)
BILIRUBIN TOTAL: 0.6 mg/dL (ref 0.3–1.2)
BUN: 45 mg/dL — AB (ref 6–23)
CHLORIDE: 94 meq/L — AB (ref 96–112)
CO2: 22 mEq/L (ref 19–32)
Calcium: 8.6 mg/dL (ref 8.4–10.5)
Creatinine, Ser: 1.24 mg/dL — ABNORMAL HIGH (ref 0.50–1.10)
GFR calc Af Amer: 50 mL/min — ABNORMAL LOW (ref >90)
GFR calc non Af Amer: 43 mL/min — ABNORMAL LOW (ref >90)
GLUCOSE: 501 mg/dL — AB (ref 70–99)
POTASSIUM: 4.5 meq/L (ref 3.7–5.3)
SODIUM: 128 meq/L — AB (ref 137–147)
TOTAL PROTEIN: 5.1 g/dL — AB (ref 6.0–8.3)

## 2013-08-15 LAB — GLUCOSE, CAPILLARY
GLUCOSE-CAPILLARY: 362 mg/dL — AB (ref 70–99)
GLUCOSE-CAPILLARY: 370 mg/dL — AB (ref 70–99)
GLUCOSE-CAPILLARY: 443 mg/dL — AB (ref 70–99)
Glucose-Capillary: 467 mg/dL — ABNORMAL HIGH (ref 70–99)
Glucose-Capillary: 385 mg/dL — ABNORMAL HIGH (ref 70–99)
Glucose-Capillary: 372 mg/dL — ABNORMAL HIGH (ref 70–99)

## 2013-08-15 LAB — HEPATITIS PANEL, ACUTE
HCV Ab: NEGATIVE
HEP B C IGM: NONREACTIVE
Hep A IgM: NONREACTIVE
Hepatitis B Surface Ag: NEGATIVE

## 2013-08-15 LAB — ABO/RH: ABO/RH(D): A POS

## 2013-08-15 LAB — STREP PNEUMONIAE URINARY ANTIGEN: Strep Pneumo Urinary Antigen: NEGATIVE

## 2013-08-15 LAB — OCCULT BLOOD X 1 CARD TO LAB, STOOL: Fecal Occult Bld: POSITIVE — AB

## 2013-08-15 LAB — PREPARE RBC (CROSSMATCH)

## 2013-08-15 MED ORDER — POTASSIUM CHLORIDE 10 MEQ/100ML IV SOLN: 10.0000 meq | INTRAVENOUS | Status: DC

## 2013-08-15 MED ORDER — INSULIN ASPART 100 UNIT/ML ~~LOC~~ SOLN: 18.0000 [IU] | Freq: Once | SUBCUTANEOUS | Status: DC

## 2013-08-15 MED ORDER — INSULIN GLARGINE 100 UNIT/ML ~~LOC~~ SOLN
10.0000 [IU] | Freq: Every day | SUBCUTANEOUS | Status: DC
  Administered 2013-08-15 – 2013-08-17 (×3): 10 [IU] via SUBCUTANEOUS
  Filled 2013-08-15 (×3): qty 0.1

## 2013-08-15 MED ORDER — DEXTROSE 5 % IV SOLN
1.0000 g | INTRAVENOUS | Status: DC
  Administered 2013-08-15 – 2013-08-16 (×2): 1 g via INTRAVENOUS
  Filled 2013-08-15 (×3): qty 10

## 2013-08-15 MED ORDER — SODIUM CHLORIDE 0.9 % IV SOLN
INTRAVENOUS | Status: DC
  Administered 2013-08-15: 08:00:00 via INTRAVENOUS

## 2013-08-15 MED ORDER — INSULIN ASPART 100 UNIT/ML ~~LOC~~ SOLN
18.0000 [IU] | Freq: Once | SUBCUTANEOUS | Status: AC
  Administered 2013-08-15: 18 [IU] via SUBCUTANEOUS

## 2013-08-15 MED ORDER — INSULIN ASPART 100 UNIT/ML ~~LOC~~ SOLN
15.0000 [IU] | Freq: Once | SUBCUTANEOUS | Status: AC
Start: 2013-08-15 — End: 2013-08-15
  Administered 2013-08-15: 15 [IU] via SUBCUTANEOUS

## 2013-08-15 NOTE — Progress Notes (Signed)
Patient ID: Cynthia Long, female   DOB: August 13, 1940, 73 y.o.   MRN: [id number]  [facility] PROGRESS NOTE  Starlina Lapre TGG:[id number] DOB: 1940-11-26 DOA: 08/13/2013 PCP: No PCP Per Patient  Brief narrative: 73 y.o. female with a PMH significant for HTN, type 2 diabetes and chronic allergic bronchitis who was tranferred from [facility] to [facility] for management of upper GI bleed. Patient gives a history of vomiting dark black material an evening prior to this admission, followed by multiple episodes of diarrhea and melena. Symptoms associated with nausea. She has never had GIB prior to this episode, no sick contacts, she does endorse taking Ibuprofen several times a week for HA and arthritis, she is not on any anticogulation. She denies having a PCP but sees an endocrinologist for her DM, a pulmonologist for her allergies and a cardiologist for her HTN in [place]. Labs from [facility] indicate a hemoglobin of 7.5 a creatinine of 2.2 and elevated liver function tests.   Principal Problem:  GIB (gastrointestinal bleeding)  - appreciate GI input, plan for endoscopy in AM  - Hg trend since admission: 8.7 --> 8.4 --> 6.9 - follow up on GI recommendations, continue PPI BID  - ? Enteric infection causing bleeding, continue to follow up GI rec's - transfuse one unit of PRBC 6/14  - repeat CBC in AM  - provide SCD's for DVT prophylaxis  Active Problems:  Nausea and vomiting  - unclear if related to NSAID use, will follow upon endoscopy findings  - provide antiemetics as needed  ? PNA - will add Rocephin for now but if GI agrees we can narrow ABX to Zosyn which would offer g-, anaerobic, CAP cpverage  Nonspecific abnormal results of liver function study  - unclear etiology, obtain acute hep panel, repeat CMET in AM  - LFT's rending down  Leukocytosis  - unclear etiology  - possibly related to enteric infection vs PNA - ABX as noted above  - repeat CBC in AM  Type II  diabetes mellitus  - place on SSI until PO intake improves  HTN (hypertension)  - reasonably well controlled  Chronic allergic bronchitis  - supportive care  Acute renal failure  - likely secondary to pre renal etiology  - Cr on admission 1.32 and continues trending down, repeat electrolyte panel in AM   Consultants:  GI Procedures/Studies:  EGD 6/13 -->  CXR   08/14/2013  Possible early pneumonia in the left lower lobe, likely with small effusion. At followup, PA and lateral radiography recommended.   Antibiotics:  Cipro 6/13 -->  Flagyl 6/13 --> Rocephin 6/14 -->   Code Status: Full  Family Communication: Pt and daughter at bedside  Disposition Plan: Remains inpatient   HPI/Subjective: No events overnight.   Objective: Filed Vitals:   08/15/13 1125 08/15/13 1220 08/15/13 1226 08/15/13 1500  BP: 125/43 146/45 146/45 145/51  Pulse: 94 100 100 100  Temp: 98 F (36.7 C) 97.8 F (36.6 C) 97.8 F (36.6 C) 98.3 F (36.8 C)  TempSrc: Oral Oral Oral Oral  Resp: 18 20 20 20   Height:      Weight:      SpO2:    100%    Intake/Output Summary (Last 24 hours) at 08/15/13 1557 Last data filed at 08/15/13 1545  Gross per 24 hour  Intake   2965 ml  Output   2001 ml  Net    964 ml    Exam:   General:  Pt is alert,  follows commands appropriately, not in acute distress  Cardiovascular: Regular rate and rhythm, S1/S2, no murmurs, no rubs, no gallops  Respiratory: Clear to auscultation bilaterally, no wheezing, rhonchi at bases   Abdomen: Soft, tender in epigastric area, non distended, bowel sounds present, no guarding  Extremities: No edema, pulses DP and PT palpable bilaterally  Neuro: Grossly nonfocal  Data Reviewed: Basic Metabolic Panel:  Recent Labs Lab 08/13/13 2014 08/15/13 0415  NA 128* 128*  K 4.9 4.5  CL 93* 94*  CO2 24 22  GLUCOSE 368* 501*  BUN 79* 45*  CREATININE 1.32* 1.24*  CALCIUM 9.0 8.6  MG 2.0  --    Liver Function Tests:  Recent  Labs Lab 08/13/13 2014 08/15/13 0415  AST 106* 66*  ALT 262* 225*  ALKPHOS 62 56  BILITOT 0.6 0.6  PROT 5.3* 5.1*  ALBUMIN 2.8* 2.7*   CBC:  Recent Labs Lab 08/13/13 2014 08/14/13 0313 08/15/13 0415  WBC 18.0* 16.0* 12.3*  HGB 8.7* 8.4* 6.9*  HCT 26.0* 24.9* 20.8*  MCV 81.3 81.6 82.9  PLT 120* 120* 96*   CBG:  Recent Labs Lab 08/14/13 2005 08/15/13 0018 08/15/13 0416 08/15/13 0802 08/15/13 1231  GLUCAP 418* 443* 467* 385* 370*    Recent Results (from the past 240 hour(s))  MRSA PCR SCREENING     Status: None   Collection Time    08/13/13  4:15 PM      Result Value Ref Range Status   MRSA by PCR NEGATIVE  NEGATIVE Final   Comment:            The GeneXpert MRSA Assay (FDA     approved for NASAL specimens     only), is one component of a     comprehensive MRSA colonization     surveillance program. It is not     intended to diagnose MRSA     infection nor to guide or     monitor treatment for     MRSA infections.     Scheduled Meds: . antiseptic oral rinse  15 mL Mouth Rinse BID  . cefTRIAXone (ROCEPHIN)  IV  1 g Intravenous Q24H  . ciprofloxacin  400 mg Intravenous Q12H  . insulin aspart  0-15 Units Subcutaneous 6 times per day  . ipratropium-albuterol  3 mL Nebulization BID  . metronidazole  500 mg Intravenous Q8H  . pantoprazole (PROTONIX) IV  40 mg Intravenous Q12H  . sodium chloride  3 mL Intravenous Q12H   Continuous Infusions: . sodium chloride 75 mL/hr at 08/15/13 0757   Faye Ramsay, MD  [facility] Pager [phone]  If 7PM-7AM, please contact night-coverage [email or web address] Password [email or web address] 08/15/2013, 3:57 PM   LOS: 2 days

## 2013-08-15 NOTE — Progress Notes (Signed)
Progress Note   Subjective  *Nausea is significantly decreased.  No abdominal pain or diarrhea.  Feels very weak.**   Objective  Vital signs in last 24 hours: Temp:  [97.6 F (36.4 C)-98.6 F (37 C)] 98 F (36.7 C) (06/14 1025) Pulse Rate:  [92-111] 97 (06/14 1025) Resp:  [14-21] 16 (06/14 1025) BP: (114-147)/(18-41) 124/38 mmHg (06/14 1025) SpO2:  [93 %-100 %] 96 % (06/14 0831) Weight:  [150 lb 14.4 oz (68.448 kg)] 150 lb 14.4 oz (68.448 kg) (06/14 4315) Last BM Date: 08/12/13 (per pt) General:   Alert,  Well-developed,  white female in NAD Heart:  Regular rate and rhythm; no murmurs Abdomen:  Soft, nontender and nondistended. Normal bowel sounds, without guarding, and without rebound.   Extremities:  Without edema. Neurologic:  Alert and  oriented x4;  grossly normal neurologically. Psych:  Alert and cooperative. Normal mood and affect.  Intake/Output from previous day: 06/13 0701 - 06/14 0700 In: 2347.5 [P.O.:240; I.V.:1507.5; IV Piggyback:600] Out: 1150 [Urine:1150] Intake/Output this shift: Total I/O In: 252.5 [P.O.:240; Blood:12.5] Out: 251 [Urine:250; Stool:1]  Lab Results:  Recent Labs  08/13/13 2014 08/14/13 0313 08/15/13 0415  WBC 18.0* 16.0* 12.3*  HGB 8.7* 8.4* 6.9*  HCT 26.0* 24.9* 20.8*  PLT 120* 120* 96*   BMET  Recent Labs  08/13/13 2014 08/15/13 0415  NA 128* 128*  K 4.9 4.5  CL 93* 94*  CO2 24 22  GLUCOSE 368* 501*  BUN 79* 45*  CREATININE 1.32* 1.24*  CALCIUM 9.0 8.6   LFT  Recent Labs  08/15/13 0415  PROT 5.1*  ALBUMIN 2.7*  AST 66*  ALT 225*  ALKPHOS 56  BILITOT 0.6   PT/INR  Recent Labs  08/13/13 2014  LABPROT 16.8*  INR 1.40   Hepatitis Panel No results found for this basename: HEPBSAG, HCVAB, HEPAIGM, HEPBIGM,  in the last 72 hours  Studies/Results: Dg Chest Port 1 View  08/14/2013   CLINICAL DATA:  Leukocytosis and cough.  Previous endoscopy.  EXAM: PORTABLE CHEST - 1 VIEW  COMPARISON:  None.   FINDINGS: No cardiomegaly. Aortic calcification. There is hypoaeration with interstitial crowding, decreasing the sensitivity of frontal radiography. Suspect small left pleural effusion. Thorax dextroscoliosis. No acute osseous findings.  IMPRESSION: Possible early pneumonia in the left lower lobe, likely with small effusion. At followup, PA and lateral radiography recommended.   Electronically Signed   By: Jorje Guild M.D.   On: 08/14/2013 13:12      Assessment & Plan  *1.  Acute GI bleed.  Endoscopy negative.  I suspect bleeding is from the colon, perhaps related to enteric infection.  She's had no further bleeding since admission.  Drop in hemoglobin is probably hemodilution.  Patient had a colonoscopy approximately 5 years ago.  Pending results of stool studies may consider followup colonoscopy. 2.  nausea/vomiting/diarrhea.  Suspect enteric infection which may also be source for bleeding.  Patient may have an early pneumonia.  Recommend continuing antibiotics * 3.  abnormal LFTs-probably secondary to hepatic steatosis 4.  diabetes mellitus  Principal Problem:   GIB (gastrointestinal bleeding) Active Problems:   Acute posthemorrhagic anemia   Blood in stool   Nonspecific abnormal results of liver function study   Lactic acidosis   Nausea with vomiting   Type II or unspecified type diabetes mellitus without mention of complication, not stated as uncontrolled   HTN (hypertension)   Chronic allergic bronchitis   Acute renal failure  LOS: 2 days   Erskine Emery  08/15/2013, 11:12 AM

## 2013-08-15 NOTE — Progress Notes (Signed)
CRITICAL VALUE ALERT  Critical value received:  Hgb- 6.9  Date of notification:  08/15/13  Time of notification:  0603  Critical value read back:yes  Nurse who received alert:  6.9  MD notified (1st page): Hospitalist  Time of first page:  0605  MD notified (2nd page):  Time of second page:  Responding MD:    Time MD responded:

## 2013-08-16 ENCOUNTER — Encounter (HOSPITAL_COMMUNITY): Payer: Self-pay | Admitting: Gastroenterology

## 2013-08-16 DIAGNOSIS — R112 Nausea with vomiting, unspecified: Secondary | ICD-10-CM

## 2013-08-16 LAB — GI PATHOGEN PANEL BY PCR, STOOL
C difficile toxin A/B: NEGATIVE
Campylobacter by PCR: NEGATIVE
Cryptosporidium by PCR: NEGATIVE
E COLI (ETEC) LT/ST: NEGATIVE
E COLI 0157 BY PCR: NEGATIVE
E coli (STEC): NEGATIVE
G lamblia by PCR: NEGATIVE
Norovirus GI/GII: NEGATIVE
Rotavirus A by PCR: NEGATIVE
SALMONELLA BY PCR: NEGATIVE
Shigella by PCR: NEGATIVE

## 2013-08-16 LAB — GLUCOSE, CAPILLARY
GLUCOSE-CAPILLARY: 229 mg/dL — AB (ref 70–99)
GLUCOSE-CAPILLARY: 318 mg/dL — AB (ref 70–99)
GLUCOSE-CAPILLARY: 363 mg/dL — AB (ref 70–99)
GLUCOSE-CAPILLARY: 367 mg/dL — AB (ref 70–99)
Glucose-Capillary: 286 mg/dL — ABNORMAL HIGH (ref 70–99)
Glucose-Capillary: 344 mg/dL — ABNORMAL HIGH (ref 70–99)
Glucose-Capillary: 369 mg/dL — ABNORMAL HIGH (ref 70–99)

## 2013-08-16 LAB — BASIC METABOLIC PANEL
BUN: 34 mg/dL — AB (ref 6–23)
BUN: 32 mg/dL — AB (ref 6–23)
CALCIUM: 8.9 mg/dL (ref 8.4–10.5)
CO2: 23 mEq/L (ref 19–32)
CO2: 23 meq/L (ref 19–32)
Calcium: 9 mg/dL (ref 8.4–10.5)
Chloride: 98 mEq/L (ref 96–112)
Chloride: 98 mEq/L (ref 96–112)
Creatinine, Ser: 1.08 mg/dL (ref 0.50–1.10)
Creatinine, Ser: 1.02 mg/dL (ref 0.50–1.10)
GFR calc Af Amer: 59 mL/min — ABNORMAL LOW (ref >90)
GFR calc non Af Amer: 51 mL/min — ABNORMAL LOW (ref >90)
GFR calc non Af Amer: 54 mL/min — ABNORMAL LOW (ref >90)
GFR, EST AFRICAN AMERICAN: 63 mL/min — AB (ref >90)
Glucose, Bld: 341 mg/dL — ABNORMAL HIGH (ref 70–99)
Glucose, Bld: 310 mg/dL — ABNORMAL HIGH (ref 70–99)
POTASSIUM: 5.5 meq/L — AB (ref 3.7–5.3)
Potassium: 5.3 mEq/L (ref 3.7–5.3)
Sodium: 129 mEq/L — ABNORMAL LOW (ref 137–147)
Sodium: 131 mEq/L — ABNORMAL LOW (ref 137–147)

## 2013-08-16 LAB — URINE CULTURE
COLONY COUNT: NO GROWTH
Culture: NO GROWTH

## 2013-08-16 LAB — CBC
HCT: 25.7 % — ABNORMAL LOW (ref 36.0–46.0)
HEMOGLOBIN: 8.8 g/dL — AB (ref 12.0–15.0)
MCH: 28.9 pg (ref 26.0–34.0)
MCHC: 34.2 g/dL (ref 30.0–36.0)
MCV: 84.5 fL (ref 78.0–100.0)
Platelets: 102 10*3/uL — ABNORMAL LOW (ref 150–400)
RBC: 3.04 MIL/uL — ABNORMAL LOW (ref 3.87–5.11)
RDW: 16.2 % — AB (ref 11.5–15.5)
WBC: 11.5 10*3/uL — AB (ref 4.0–10.5)

## 2013-08-16 LAB — LEGIONELLA ANTIGEN, URINE: Legionella Antigen, Urine: NEGATIVE

## 2013-08-16 LAB — TYPE AND SCREEN
ABO/RH(D): A POS
Antibody Screen: NEGATIVE
Unit division: 0

## 2013-08-16 MED ORDER — BENZONATATE 100 MG PO CAPS
200.0000 mg | ORAL_CAPSULE | Freq: Three times a day (TID) | ORAL | Status: DC | PRN
  Administered 2013-08-16 – 2013-08-17 (×2): 200 mg via ORAL
  Filled 2013-08-16 (×2): qty 2

## 2013-08-16 NOTE — Progress Notes (Signed)
Inpatient Diabetes Program Recommendations  AACE/ADA: New Consensus Statement on Inpatient Glycemic Control (2013)  Target Ranges:  Prepandial:   less than 140 mg/dL      Peak postprandial:   less than 180 mg/dL (1-2 hours)      Critically ill patients:  140 - 180 mg/dL   Reason for assessment: Hyperglycemia    Diabetes history: Type 2 diabetes, reportedly managed by endocrinologist Outpatient Diabetes medications: Amaryl 4 mg bid Current orders for Inpatient glycemic control: Lantus 10 units at HS (started last night), Novolog moderate correction scale q 4 hours.  Results for Cynthia Long, Cynthia Long (MRN [id number]) as of 08/16/2013 13:31  Ref. Range 08/15/2013 08:02 08/15/2013 12:31 08/15/2013 17:46 08/15/2013 19:57 08/16/2013 00:02 08/16/2013 03:57 08/16/2013 08:11 08/16/2013 11:26  Glucose-Capillary Latest Range: 70-99 mg/dL 385 (H) 370 (H) 362 (H) 372 (H) 369 (H) 344 (H) 286 (H) 318 (H)   Note:  A1C was 6.9 but hemoglobin low so accuracy is questioned.  Some improvement seen in CBG's since Lantus started.  Request MD consider:  Increasing correction scale to Resistant every 4 hours  Assess 8 am CBG tomorrow and consider increasing Lantus if still suboptimal  Have patient follow up with her endocrinologist ASAP after discharge   Thank you.  Ether Goebel S. Marcelline Mates, RN, CNS, CDE Inpatient Diabetes Program, team pager [phone]

## 2013-08-16 NOTE — Progress Notes (Signed)
Patient ID: Cynthia Long, female   DOB: 1940-11-14, 73 y.o.   MRN: [id number]  [facility] PROGRESS NOTE  Aiyanah Kalama DGL:[id number] DOB: 05-26-1940 DOA: 08/13/2013 PCP: No PCP Per Patient  Brief narrative:  73 y.o. female with a PMH significant for HTN, type 2 diabetes and chronic allergic bronchitis who was tranferred from [facility] to [facility] for management of upper GI bleed. Patient gives a history of vomiting dark black material an evening prior to this admission, followed by multiple episodes of diarrhea and melena. Symptoms associated with nausea. She has never had GIB prior to this episode, no sick contacts, she does endorse taking Ibuprofen several times a week for HA and arthritis, she is not on any anticogulation. She denies having a PCP but sees an endocrinologist for her DM, a pulmonologist for her allergies and a cardiologist for her HTN in [place]. Labs from [facility] indicate a hemoglobin of 7.5 a creatinine of 2.2 and elevated liver function tests.   Principal Problem:  GIB (gastrointestinal bleeding)  - appreciate GI input, plan for endoscopy in AM  - Hg trend since admission: 8.7 --> 8.4 --> 6.9 --> 8.8 - follow up on GI recommendations, continue PPI BID  - ? Enteric infection causing bleeding, continue to follow up GI rec's  - transfused one unit of PRBC 6/14 with appropriate increase in Hg post transfusion  - repeat CBC in AM  - provide SCD's for DVT prophylaxis  Active Problems:  Nausea and vomiting  - unclear if related to NSAID use, will follow upon endoscopy findings  - provide antiemetics as needed  ? PNA  - will add Rocephin for now but if GI agrees we can narrow ABX to Zosyn which would offer g-, anaerobic, CAP cpverage  Nonspecific abnormal results of liver function study  - unclear etiology, obtain acute hep panel, repeat CMET in AM  - LFT's rending down  Leukocytosis  - possibly related to enteric infection vs PNA  - ABX as noted  above, ABC continue to trend down   - repeat CBC in AM  Type II diabetes mellitus  - place on SSI until PO intake improves  HTN (hypertension)  - reasonably well controlled  Chronic allergic bronchitis  - supportive care  Acute renal failure  - likely secondary to pre renal etiology  - Cr on admission 1.32 and continues trending down, repeat electrolyte panel in AM   Consultants:  GI Procedures/Studies:  EGD 6/13 -->  CXR 08/14/2013 Possible early pneumonia in the left lower lobe, likely with small effusion. At followup, PA and lateral radiography recommended.  Antibiotics:  Cipro 6/13 -->  Flagyl 6/13 -->  Rocephin 6/14 -->   Code Status: Full  Family Communication: Pt and daughter at bedside  Disposition Plan: Remains inpatient   HPI/Subjective: No events overnight.   Objective: Filed Vitals:   08/15/13 1226 08/15/13 1500 08/15/13 1958 08/16/13 0359  BP: 146/45 145/51 135/41 142/51  Pulse: 100 100 85 95  Temp: 97.8 F (36.6 C) 98.3 F (36.8 C) 98 F (36.7 C) 98 F (36.7 C)  TempSrc: Oral Oral Axillary Oral  Resp: 20 20 16 16   Height:      Weight:    69.4 kg (153 lb)  SpO2:  100% 100% 99%    Intake/Output Summary (Last 24 hours) at 08/16/13 0739 Last data filed at 08/16/13 0500  Gross per 24 hour  Intake 2889.08 ml  Output   1451 ml  Net 1438.08 ml  Exam:   General:  Pt is alert, follows commands appropriately, not in acute distress  Cardiovascular: Regular rate and rhythm, SEM 3/6, no rubs, no gallops  Respiratory: Clear to auscultation bilaterally, no wheezing, no crackles, no rhonchi  Abdomen: Soft, non tender, non distended, bowel sounds present, no guarding  Extremities: No edema, pulses DP and PT palpable bilaterally  Neuro: Grossly nonfocal  Data Reviewed: Basic Metabolic Panel:  Recent Labs Lab 08/13/13 2014 08/15/13 0415 08/16/13 0350  NA 128* 128* 129*  K 4.9 4.5 5.3  CL 93* 94* 98  CO2 24 22 23   GLUCOSE 368* 501* 341*  BUN  79* 45* 34*  CREATININE 1.32* 1.24* 1.08  CALCIUM 9.0 8.6 8.9  MG 2.0  --   --    Liver Function Tests:  Recent Labs Lab 08/13/13 2014 08/15/13 0415  AST 106* 66*  ALT 262* 225*  ALKPHOS 62 56  BILITOT 0.6 0.6  PROT 5.3* 5.1*  ALBUMIN 2.8* 2.7*   CBC:  Recent Labs Lab 08/13/13 2014 08/14/13 0313 08/15/13 0415  WBC 18.0* 16.0* 12.3*  HGB 8.7* 8.4* 6.9*  HCT 26.0* 24.9* 20.8*  MCV 81.3 81.6 82.9  PLT 120* 120* 96*   Cardiac Enzymes: CBG:  Recent Labs Lab 08/15/13 1231 08/15/13 1746 08/15/13 1957 08/16/13 0002 08/16/13 0357  GLUCAP 370* 362* 372* 369* 344*    Recent Results (from the past 240 hour(s))  MRSA PCR SCREENING     Status: None   Collection Time    08/13/13  4:15 PM      Result Value Ref Range Status   MRSA by PCR NEGATIVE  NEGATIVE Final   Comment:            The GeneXpert MRSA Assay (FDA     approved for NASAL specimens     only), is one component of a     comprehensive MRSA colonization     surveillance program. It is not     intended to diagnose MRSA     infection nor to guide or     monitor treatment for     MRSA infections.     Scheduled Meds: . antiseptic oral rinse  15 mL Mouth Rinse BID  . cefTRIAXone (ROCEPHIN)  IV  1 g Intravenous Q24H  . ciprofloxacin  400 mg Intravenous Q12H  . insulin aspart  0-15 Units Subcutaneous 6 times per day  . insulin glargine  10 Units Subcutaneous QHS  . ipratropium-albuterol  3 mL Nebulization BID  . metronidazole  500 mg Intravenous Q8H  . pantoprazole (PROTONIX) IV  40 mg Intravenous Q12H  . sodium chloride  3 mL Intravenous Q12H   Continuous Infusions: . sodium chloride 20 mL/hr at 08/15/13 1614     Faye Ramsay, MD  [facility] Pager [phone]  If 7PM-7AM, please contact night-coverage [email or web address] Password [email or web address] 08/16/2013, 7:39 AM   LOS: 3 days

## 2013-08-16 NOTE — Progress Notes (Signed)
Patient had 1 black/dark stool this afternoon.  Tolerating diet with no nausea/vomiting episodes.

## 2013-08-16 NOTE — Progress Notes (Signed)
[facility] Progress Note  Subjective:  No significant stool/BM since admission here at [facility].  No further nausea or vomiting since coming here.  No abdominal pain.  Tolerating clear liquid diet well.  Stool studies pending.  Objective:  Vital signs in last 24 hours: Temp:  [97.6 F (36.4 C)-98.3 F (36.8 C)] 98 F (36.7 C) (06/15 0359) Pulse Rate:  [85-104] 95 (06/15 0359) Resp:  [16-20] 16 (06/15 0359) BP: (124-146)/(38-51) 142/51 mmHg (06/15 0359) SpO2:  [98 %-100 %] 98 % (06/15 0837) Weight:  [153 lb (69.4 kg)] 153 lb (69.4 kg) (06/15 0359) Last BM Date: 08/15/13 General:  Alert, Well-developed, in NAD Heart:  Regular rate and rhythm Pulm:  CTAB.  No W/R/R. Abdomen:  Soft, non-distended. Normal bowel sounds.  Non-tender. Extremities:  Without edema. Neurologic:  Alert and  oriented x4;  grossly normal neurologically. Psych:  Alert and cooperative. Normal mood and affect.  Intake/Output from previous day: 06/14 0701 - 06/15 0700 In: 2889.1 [P.O.:600; I.V.:876.6; Blood:362.5; IV Piggyback:1050] Out: 1451 [Urine:1450; Stool:1]  Lab Results:  Recent Labs  08/14/13 0313 08/15/13 0415 08/16/13 0807  WBC 16.0* 12.3* 11.5*  HGB 8.4* 6.9* 8.8*  HCT 24.9* 20.8* 25.7*  PLT 120* 96* 102*   BMET  Recent Labs  08/15/13 0415 08/16/13 0350 08/16/13 0807  NA 128* 129* 131*  K 4.5 5.3 5.5*  CL 94* 98 98  CO2 22 23 23   GLUCOSE 501* 341* 310*  BUN 45* 34* 32*  CREATININE 1.24* 1.08 1.02  CALCIUM 8.6 8.9 9.0   LFT  Recent Labs  08/15/13 0415  PROT 5.1*  ALBUMIN 2.7*  AST 66*  ALT 225*  ALKPHOS 56  BILITOT 0.6   PT/INR  Recent Labs  08/13/13 2014  LABPROT 16.8*  INR 1.40   Hepatitis Panel  Recent Labs  08/15/13 0415  HEPBSAG NEGATIVE  HCVAB NEGATIVE  HEPAIGM NON REACTIVE  HEPBIGM NON REACTIVE    Dg Chest Port 1 View  08/14/2013   CLINICAL DATA:  Leukocytosis and cough.  Previous endoscopy.  EXAM: PORTABLE CHEST - 1 VIEW   COMPARISON:  None.  FINDINGS: No cardiomegaly. Aortic calcification. There is hypoaeration with interstitial crowding, decreasing the sensitivity of frontal radiography. Suspect small left pleural effusion. Thorax dextroscoliosis. No acute osseous findings.  IMPRESSION: Possible early pneumonia in the left lower lobe, likely with small effusion. At followup, PA and lateral radiography recommended.   Electronically Signed   By: Jorje Guild M.D.   On: 08/14/2013 13:12    Assessment / Plan: 1. Acute GI bleed/melena. Endoscopy negative.  Suspect bleeding is from the colon, perhaps related to enteric infection. She's had no further bleeding since admission. Drop in hemoglobin wass probably hemodilution; Hgb now improved appropriately s/p 1 unit PRBC's.  Patient had a colonoscopy approximately 5 years ago.  Likely no plans for colonoscopy during this hospital admission.  Follow-up stool study results.  2.  Nausea/vomiting/diarrhea. Suspect enteric infection which may also be source for bleeding.  Recommend continuing empiric antibiotics in the form of cipro and flagyl. 3.  Abnormal LFTs-probably secondary to hepatic steatosis vs reactive changes.  Viral hepatitis panel is negative.  Would have these followed up as an outpatient.  4.  Diabetes mellitus:  Uncontrolled.   *I have advanced her diet to full liquids and can advance further as tolerated.   LOS: 3 days   ZEHR, JESSICA D.  08/16/2013, 9:02 AM  Pager number [phone]   [place] [facility] Attending  I have also seen and assessed the patient and agree with the above note. She reports normal stool today.  Exact cause of her problems of N/V dark diarrhea, heme + stool seems infectious.  Would dc tomorrow and she can see [name] or one of our advanced practitioners and have an outpatient colonoscopy because of heme + stool and anemia - most likely will be ok but I think worth checking.  Since she seems resolved I would not dc on Abx for GI  infection - 3 days often enough  Gatha Mayer, MD, [facility] [phone] (pager) 08/16/2013 5:40 PM

## 2013-08-17 ENCOUNTER — Encounter: Payer: Self-pay | Admitting: Gastroenterology

## 2013-08-17 DIAGNOSIS — J45909 Unspecified asthma, uncomplicated: Secondary | ICD-10-CM

## 2013-08-17 LAB — CBC
HCT: 24.9 % — ABNORMAL LOW (ref 36.0–46.0)
HEMOGLOBIN: 8.4 g/dL — AB (ref 12.0–15.0)
MCH: 29.2 pg (ref 26.0–34.0)
MCHC: 33.7 g/dL (ref 30.0–36.0)
MCV: 86.5 fL (ref 78.0–100.0)
PLATELETS: 87 10*3/uL — AB (ref 150–400)
RBC: 2.88 MIL/uL — AB (ref 3.87–5.11)
RDW: 17 % — ABNORMAL HIGH (ref 11.5–15.5)
WBC: 9.1 10*3/uL (ref 4.0–10.5)

## 2013-08-17 LAB — BASIC METABOLIC PANEL
BUN: 26 mg/dL — ABNORMAL HIGH (ref 6–23)
CALCIUM: 8.8 mg/dL (ref 8.4–10.5)
CHLORIDE: 98 meq/L (ref 96–112)
CO2: 21 meq/L (ref 19–32)
Creatinine, Ser: 0.92 mg/dL (ref 0.50–1.10)
GFR calc Af Amer: 71 mL/min — ABNORMAL LOW (ref >90)
GFR calc non Af Amer: 62 mL/min — ABNORMAL LOW (ref >90)
GLUCOSE: 249 mg/dL — AB (ref 70–99)
POTASSIUM: 4.6 meq/L (ref 3.7–5.3)
SODIUM: 129 meq/L — AB (ref 137–147)

## 2013-08-17 LAB — GLUCOSE, CAPILLARY
GLUCOSE-CAPILLARY: 228 mg/dL — AB (ref 70–99)
GLUCOSE-CAPILLARY: 283 mg/dL — AB (ref 70–99)
GLUCOSE-CAPILLARY: 273 mg/dL — AB (ref 70–99)
GLUCOSE-CAPILLARY: 243 mg/dL — AB (ref 70–99)
Glucose-Capillary: 214 mg/dL — ABNORMAL HIGH (ref 70–99)

## 2013-08-17 MED ORDER — METRONIDAZOLE 500 MG PO TABS
500.0000 mg | ORAL_TABLET | Freq: Three times a day (TID) | ORAL | Status: DC
  Administered 2013-08-17 – 2013-08-18 (×3): 500 mg via ORAL
  Filled 2013-08-17 (×6): qty 1

## 2013-08-17 MED ORDER — LEVOFLOXACIN 500 MG PO TABS
500.0000 mg | ORAL_TABLET | Freq: Every day | ORAL | Status: DC
  Administered 2013-08-17 – 2013-08-18 (×2): 500 mg via ORAL
  Filled 2013-08-17 (×2): qty 1

## 2013-08-17 MED ORDER — INSULIN ASPART 100 UNIT/ML ~~LOC~~ SOLN
0.0000 [IU] | Freq: Three times a day (TID) | SUBCUTANEOUS | Status: DC
  Administered 2013-08-18: 5 [IU] via SUBCUTANEOUS
  Administered 2013-08-18: 8 [IU] via SUBCUTANEOUS

## 2013-08-17 MED ORDER — PANTOPRAZOLE SODIUM 40 MG PO TBEC
40.0000 mg | DELAYED_RELEASE_TABLET | Freq: Two times a day (BID) | ORAL | Status: DC
  Administered 2013-08-17 – 2013-08-18 (×2): 40 mg via ORAL
  Filled 2013-08-17 (×3): qty 1

## 2013-08-17 NOTE — Care Management Note (Addendum)
    Page 1 of 1   08/17/2013     2:29:32 PM CARE MANAGEMENT NOTE 08/17/2013  Patient:  Cynthia Long, Cynthia Long   Account Number:  [id number]  Date Initiated:  08/17/2013  Documentation initiated by:  Dessa Phi  Subjective/Objective Assessment:   73 Y/O F ADMITTED W/GIB.     Action/Plan:   FROM HOME.   Anticipated DC Date:  08/18/2013   Anticipated DC Plan:  [place]  CM consult      Choice offered to / List presented to:             Status of service:  Completed, signed off Medicare Important Message given?  YES (If response is "NO", the following Medicare IM given date fields will be blank) Date Medicare IM given:  08/17/2013 Date Additional Medicare IM given:    Discharge Disposition:  HOME/SELF CARE  Per UR Regulation:  Reviewed for med. necessity/level of care/duration of stay  If discussed at Long Length of Stay Meetings, dates discussed:    Comments:  08/17/13 KATHY MAHABIR RN,BSN NCM [phone] GI SIGNED OFF.MONITORING HGB.NO D/C NEEDS.

## 2013-08-17 NOTE — Clinical Documentation Improvement (Signed)
Possible Clinical Conditions?   Hyponatremia                                 Other Condition___________________                 Cannot Clinically Determine_________   Supporting Information: Sodium Range 128  (08/13/13 -08/15/13)  IVF  (NS)   Thank You, Serena Colonel ,RN Clinical Documentation Specialist:  [facility] Information Management

## 2013-08-17 NOTE — Progress Notes (Signed)
Inpatient Diabetes Program Recommendations  AACE/ADA: New Consensus Statement on Inpatient Glycemic Control (2013)  Target Ranges:  Prepandial:   less than 140 mg/dL      Peak postprandial:   less than 180 mg/dL (1-2 hours)      Critically ill patients:  140 - 180 mg/dL   Reason for assessment: Hyperglycemia   Diabetes history: Type 2 diabetes, reportedly managed by endocrinologist  Outpatient Diabetes medications: Amaryl 4 mg bid  Current orders for Inpatient glycemic control: Lantus 10 units at HS (started last night), Novolog moderate correction scale q 4 hours. Results for LESLYE, PUCCINI (MRN [id number]) as of 08/17/2013 16:09  Ref. Range 08/16/2013 08:11 08/16/2013 11:26 08/16/2013 16:25 08/16/2013 19:52 08/16/2013 23:49 08/17/2013 04:05 08/17/2013 07:26 08/17/2013 12:01  Glucose-Capillary Latest Range: 70-99 mg/dL 286 (H) 318 (H) 363 (H) 367 (H) 229 (H) 228 (H) 214 (H) 283 (H)   Note:  Request MD consider increasing Lantus dose to 15 units at HS and increasing correction to resistant scale.  Does MD anticipate patient requiring insulin at discharge?  If so, please indicate to nursing staff so instruction can be done.  Thank you.  Patti S. Marcelline Mates, RN, CNS, CDE Inpatient Diabetes Program, team pager [phone]

## 2013-08-17 NOTE — Progress Notes (Addendum)
Patient ID: Cynthia Long, female   DOB: 1940/06/09, 73 y.o.   MRN: [id number] [facility] PROGRESS NOTE  Cynthia Long CBU:[id number] DOB: 12/03/1940 DOA: 08/13/2013 PCP: No PCP Per Patient  Brief narrative:  73 y.o. female with a PMH significant for HTN, type 2 diabetes and chronic allergic bronchitis who was tranferred from [place] to [facility] for management of upper GI bleed. Patient gives a history of vomiting dark black material an evening prior to this admission, followed by multiple episodes of diarrhea and melena. Symptoms associated with nausea. She has never had GIB prior to this episode, no sick contacts, she does endorse taking Ibuprofen several times a week for HA and arthritis, she is not on any anticogulation. She denies having a PCP but sees an endocrinologist for her DM, a pulmonologist for her allergies and a cardiologist for her HTN in [place]. Labs from [facility] indicate a hemoglobin of 7.5 a creatinine of 2.2 and elevated liver function tests.   Principal Problem:  GIB (gastrointestinal bleeding)  - appreciate GI input, plan for endoscopy in AM  - Hg trend since admission: 8.7 --> 8.4 --> 6.9 --> 8.8 --> 8.4 - no plan to perform colonoscopy at this time, GI has signed off June 16th, 2015  - ? Enteric infection causing bleeding - transfused one unit of PRBC 6/14 with appropriate increase in Hg post transfusion  - provide SCD's for DVT prophylaxis  - if Hg remains stable in next 24 hours, will consider d/c home but if further drop in Hg noted may need to have longer observation  Active Problems:  Nausea and vomiting  - unclear if related to NSAID use, now resolved, pt tolerating regular diet well  - continue protonix BID and change to PO, change to QD upon discharge  - provide antiemetics as needed  ? PNA  - will narrow ABX to Levaquin and Flagyl  - given pt chronic cough, she has requested referral to [place] [facility] - I scheduled an appointment  with Dr. Lamonte Sakai July 9th, 2015 at 4 pm, pt made aware  Nonspecific abnormal results of liver function study  - possibly related to acute enteric infection  - LFT's rending down  Leukocytosis  - possibly related to enteric infection vs PNA  - ABX as noted above, ABC continue to trend down and WNL this AM - repeat CBC in AM  Type II diabetes mellitus  - place on SSI while inpatient  - continue Lantus  HTN (hypertension)  - reasonably well controlled  Chronic allergic bronchitis  - supportive care  Acute renal failure  - likely secondary to pre renal etiology  - Cr on admission 1.32 and continues trending down, Cr is WNL this AM  Consultants:  GI Procedures/Studies:  EGD 6/13 -->  CXR 08/14/2013 Possible early pneumonia in the left lower lobe, likely with small effusion. At followup, PA and lateral radiography recommended.  Antibiotics:  Cipro 6/13 --> 6/16 Flagyl 6/13 -->  Rocephin 6/14 --> 6/16 Levaquin 6/16 -->  Code Status: Full  Family Communication: Pt and daughter at bedside  Disposition Plan: Remains inpatient, possible d/c in 24-48 hours, if Hg remains stable   HPI/Subjective: No events overnight.   Objective: Filed Vitals:   08/16/13 1226 08/16/13 2001 08/17/13 0412 08/17/13 1536  BP: 130/40 130/55 136/55 142/52  Pulse: 109 90 85 98  Temp: 98.1 F (36.7 C) 98.7 F (37.1 C) 98.1 F (36.7 C) 97.9 F (36.6 C)  TempSrc: Oral Oral Oral Oral  Resp: 18 16 16 18   Height:      Weight:   70.988 kg (156 lb 8 oz)   SpO2: 100% 99% 98% 99%    Intake/Output Summary (Last 24 hours) at 08/17/13 1806 Last data filed at 08/17/13 0758  Gross per 24 hour  Intake   1140 ml  Output      0 ml  Net   1140 ml    Exam:   General:  Pt is alert, follows commands appropriately, not in acute distress  Cardiovascular: Regular rate and rhythm, SEM 3/6, no rubs, no gallops  Respiratory: Clear to auscultation bilaterally, no wheezing, no crackles, no rhonchi  Abdomen: Soft,  non tender, non distended, bowel sounds present, no guarding  Extremities: No edema, pulses DP and PT palpable bilaterally  Neuro: Grossly nonfocal  Data Reviewed: Basic Metabolic Panel:  Recent Labs Lab 08/13/13 2014 08/15/13 0415 08/16/13 0350 08/16/13 0807 08/17/13 0358  NA 128* 128* 129* 131* 129*  K 4.9 4.5 5.3 5.5* 4.6  CL 93* 94* 98 98 98  CO2 24 22 23 23 21   GLUCOSE 368* 501* 341* 310* 249*  BUN 79* 45* 34* 32* 26*  CREATININE 1.32* 1.24* 1.08 1.02 0.92  CALCIUM 9.0 8.6 8.9 9.0 8.8  MG 2.0  --   --   --   --    Liver Function Tests:  Recent Labs Lab 08/13/13 2014 08/15/13 0415  AST 106* 66*  ALT 262* 225*  ALKPHOS 62 56  BILITOT 0.6 0.6  PROT 5.3* 5.1*  ALBUMIN 2.8* 2.7*   CBC:  Recent Labs Lab 08/13/13 2014 08/14/13 0313 08/15/13 0415 08/16/13 0807 08/17/13 0358  WBC 18.0* 16.0* 12.3* 11.5* 9.1  HGB 8.7* 8.4* 6.9* 8.8* 8.4*  HCT 26.0* 24.9* 20.8* 25.7* 24.9*  MCV 81.3 81.6 82.9 84.5 86.5  PLT 120* 120* 96* 102* 87*   CBG:  Recent Labs Lab 08/16/13 2349 08/17/13 0405 08/17/13 0726 08/17/13 1201 08/17/13 1601  GLUCAP 229* 228* 214* 283* 273*    Recent Results (from the past 240 hour(s))  MRSA PCR SCREENING     Status: None   Collection Time    08/13/13  4:15 PM      Result Value Ref Range Status   MRSA by PCR NEGATIVE  NEGATIVE Final   Comment:            The GeneXpert MRSA Assay (FDA     approved for NASAL specimens     only), is one component of a     comprehensive MRSA colonization     surveillance program. It is not     intended to diagnose MRSA     infection nor to guide or     monitor treatment for     MRSA infections.  URINE CULTURE     Status: None   Collection Time    08/14/13 10:41 PM      Result Value Ref Range Status   Specimen Description URINE, CLEAN CATCH   Final   Special Requests NONE   Final   Culture  Setup Time     Final   Value: 08/15/2013 17:31     Performed at [place]      Final   Value: NO GROWTH     Performed at [facility]   Culture     Final   Value: NO GROWTH     Performed at [facility]   Report Status 08/16/2013  FINAL   Final     Scheduled Meds: . antiseptic oral rinse  15 mL Mouth Rinse BID  . insulin aspart  0-15 Units Subcutaneous 6 times per day  . insulin glargine  10 Units Subcutaneous QHS  . levofloxacin  500 mg Oral Daily  . metroNIDAZOLE  500 mg Oral 3 times per day  . pantoprazole (PROTONIX) IV  40 mg Intravenous Q12H  . sodium chloride  3 mL Intravenous Q12H   Continuous Infusions: . sodium chloride 20 mL/hr at 08/15/13 1614  Faye Ramsay, MD  [facility] Pager [phone]  If 7PM-7AM, please contact night-coverage [email or web address] Password [email or web address] 08/17/2013, 6:06 PM   LOS: 4 days

## 2013-08-17 NOTE — Progress Notes (Signed)
Made patient a follow-up appt in our office for next week.  Will discuss and scheduled colonoscopy, etc at that time.  Will sign off from GI standpoint.  Call with questions.

## 2013-08-18 DIAGNOSIS — K529 Noninfective gastroenteritis and colitis, unspecified: Secondary | ICD-10-CM | POA: Diagnosis present

## 2013-08-18 DIAGNOSIS — K5289 Other specified noninfective gastroenteritis and colitis: Secondary | ICD-10-CM

## 2013-08-18 DIAGNOSIS — R945 Abnormal results of liver function studies: Secondary | ICD-10-CM

## 2013-08-18 DIAGNOSIS — E119 Type 2 diabetes mellitus without complications: Secondary | ICD-10-CM

## 2013-08-18 LAB — BASIC METABOLIC PANEL WITH GFR
BUN: 27 mg/dL — ABNORMAL HIGH (ref 6–23)
CO2: 22 meq/L (ref 19–32)
Calcium: 8.5 mg/dL (ref 8.4–10.5)
Chloride: 98 meq/L (ref 96–112)
Creatinine, Ser: 0.9 mg/dL (ref 0.50–1.10)
GFR calc Af Amer: 73 mL/min — ABNORMAL LOW
GFR calc non Af Amer: 63 mL/min — ABNORMAL LOW
Glucose, Bld: 230 mg/dL — ABNORMAL HIGH (ref 70–99)
Potassium: 4.6 meq/L (ref 3.7–5.3)
Sodium: 130 meq/L — ABNORMAL LOW (ref 137–147)

## 2013-08-18 LAB — CBC
HCT: 24.9 % — ABNORMAL LOW (ref 36.0–46.0)
Hemoglobin: 8.2 g/dL — ABNORMAL LOW (ref 12.0–15.0)
MCH: 28.4 pg (ref 26.0–34.0)
MCHC: 32.9 g/dL (ref 30.0–36.0)
MCV: 86.2 fL (ref 78.0–100.0)
Platelets: 89 10*3/uL — ABNORMAL LOW (ref 150–400)
RBC: 2.89 MIL/uL — ABNORMAL LOW (ref 3.87–5.11)
RDW: 17.4 % — AB (ref 11.5–15.5)
WBC: 7.6 10*3/uL (ref 4.0–10.5)

## 2013-08-18 LAB — GLUCOSE, CAPILLARY
GLUCOSE-CAPILLARY: 253 mg/dL — AB (ref 70–99)
Glucose-Capillary: 220 mg/dL — ABNORMAL HIGH (ref 70–99)

## 2013-08-18 MED ORDER — LEVOFLOXACIN 500 MG PO TABS: 500.0000 mg | ORAL_TABLET | Freq: Every day | ORAL | Status: AC

## 2013-08-18 MED ORDER — BENZONATATE 200 MG PO CAPS: 200.0000 mg | ORAL_CAPSULE | Freq: Three times a day (TID) | ORAL | Status: DC | PRN

## 2013-08-18 NOTE — Discharge Summary (Signed)
Physician Discharge Summary  Cynthia Long GDJ:[id number] DOB: 08/07/1940 DOA: 08/13/2013  PCP: No PCP Per Patient  Admit date: 08/13/2013 Discharge date: 08/18/2013  Time spent: 65 minutes  Recommendations for Outpatient Follow-up:  1. Followup with Alonza Bogus, [facility] on 08/27/2013 2. Followup with Dr. Lamonte Sakai 09/09/2013 for followup on chronic cough. 3. Patient is to pick a PCP and follow up in one to 2 weeks. Will follow up CBC will need to be obtained to followup on patient's hemoglobin. A comprehensive metabolic profile need to be done to follow up on electrolytes, renal function and LFTs.  Discharge Diagnoses:  Principal Problem:   Blood in stool Active Problems:   Acute posthemorrhagic anemia   Nonspecific abnormal results of liver function study   Lactic acidosis   Nausea with vomiting   Type II or unspecified type diabetes mellitus without mention of complication, not stated as uncontrolled   HTN (hypertension)   Chronic allergic bronchitis   GIB (gastrointestinal bleeding)   Acute renal failure   Enteritis   Discharge Condition: stable and improved  Diet recommendation: carb modified diet  Filed Weights   08/16/13 0359 08/17/13 0412 08/18/13 0500  Weight: 69.4 kg (153 lb) 70.988 kg (156 lb 8 oz) 70.4 kg (155 lb 3.3 oz)    History of present illness:  Cynthia Long is a 73 y.o. female with a PMH significant for HTN, type 2 diabetes and chronic allergic bronchitis who was tranferred from [facility] to [facility] for management of possible upper GI bleed. Patient gives a history of vomiting dark black material last evening followed by multiple episodes of diarrhea and melena. Symptoms associated with nausea. She has never had GIB prior to this episode, no sick contacts, she does endorse taking Ibuprofen several times a week for HA and arthritis, she is not on any anticogulation. Sghe denies having a PCP but sees an endocrinologist for her DM, a pulmonologist  for her allergies and a cardiologist for her HTN in [place]. Minimal PO intake in the past 24 hours. Currently in stepdown, no complaints other than HA, no vomiting, her BP is in a normal range and other vitals are normal. Labs from [facility] indicate a hemoglobin of 7.5 a creatinine of 2.2 and elevated liver function tests.   Hospital Course:  #1 ?? GI bleed Patient was admitted with probable upper GI bleed as he presented with dark black emesis followed by multiple episodes of diarrhea and melena with associated nausea. Patient did endorse use of ibuprofen several times a week for headache and arthritis. Patient was admitted to the step down unit and labs from the outside hospital had a hemoglobin of 7.5. Patient was transfused one unit of packed red blood cells on 08/15/2013 with appropriate increase in the hemoglobin. GI consultation was obtained. Patient subsequently underwent an upper endoscopy which was normal.it was felt that patient's symptoms were likely secondary to an enteric infection.. Patient had been placed empirically on Cipro and Flagyl. Patient did not have any further bleeding. Patient's hemoglobin stabilized. Patient will be discharged in stable and improved condition. Patient is to followup with GI as outpatient in one week to schedule an outpatient colonoscopy at that time. Patient was discharged in stable condition.  #2 nausea and vomiting Patient presented with nausea and emesis. The likely secondary to her enteric infection.patient was placed empirically on ciprofloxacin and Flagyl. Patient improved clinically diet was advanced and was tolerating a regular diet. Patient was maintained on a PPI. Patient was discharged  in stable and improved condition.  #3 ?? CAP On admission was concern for possible community-acquired pneumonia. Patient was initially placed empirically on ciprofloxacin and Flagyl which was subsequently changed to Levaquin and Flagyl. Flagyl was  subsequently discontinued. Patient was discharged on 4 more days of oral Levaquin to complete a course of antibiotic therapy. Patient is to followup with PCP in about pulmonary as outpatient.  #4 abnormal LFTs Felt to be likely secondary to an enteric infection. Patient's LFTs trended down. Additional followup as outpatient.  #5 leukocytosis Admission patient was noted to have a leukocytosis. Was felt this was likely secondary to an enteric infection versus community-acquired pneumonia. Patient was given antibiotics during the hospitalization. Patient was discharged home on 4 more days of oral Levaquin to complete a course of antibiotic therapy.  #6 acute renal failure On admission patient was noted to have an elevated creatinine of 1.32.it was felt patient likely had a prerenal azotemia. Patient was hydrated with IV fluids and had resolved by day of discharge.  The rest of patient's chronic medical issues remained stable throughout the hospitalization patient be discharged in stable and improved condition.   Procedures: EGD 6/13 -->  CXR 08/14/2013 Possible early pneumonia in the left lower lobe, likely with small effusion. At followup, PA and lateral radiography    Consultations:  GI  Discharge Exam: Filed Vitals:   08/18/13 0500  BP: 135/53  Pulse: 84  Temp: 98.1 F (36.7 C)  Resp: 16    General: nad Cardiovascular: rrr Respiratory: ctab  Discharge Instructions You were cared for by a hospitalist during your hospital stay. If you have any questions about your discharge medications or the care you received while you were in the hospital after you are discharged, you can call the unit and asked to speak with the hospitalist on call if the hospitalist that took care of you is not available. Once you are discharged, your primary care physician will handle any further medical issues. Please note that NO REFILLS for any discharge medications will be authorized once you are  discharged, as it is imperative that you return to your primary care physician (or establish a relationship with a primary care physician if you do not have one) for your aftercare needs so that they can reassess your need for medications and monitor your lab values.  Discharge Instructions   Diet Carb Modified    Complete by:  As directed      Discharge instructions    Complete by:  As directed   Follow up with Alonza Bogus, [facility] on 08/27/13 Followup with Dr. Lamonte Sakai, pulmonary 09/09/2013 Pick a PCP to followup with PCP in 1-2 weeks.     Increase activity slowly    Complete by:  As directed             Medication List         aspirin EC 81 MG tablet  Take 81 mg by mouth daily.     benzonatate 200 MG capsule  Commonly known as:  TESSALON  Take 1 capsule (200 mg total) by mouth 3 (three) times daily as needed for cough. tAKE FOR 4 DAYS THEN STOP.     diltiazem 120 MG 24 hr capsule  Commonly known as:  DILACOR XR  Take 120 mg by mouth daily.     glimepiride 4 MG tablet  Commonly known as:  AMARYL  Take 4 mg by mouth 2 (two) times daily.     KOMBIGLYZE XR 07-998  MG Tb24  Generic drug:  Saxagliptin-Metformin  Take 1 tablet by mouth daily.     levofloxacin 500 MG tablet  Commonly known as:  LEVAQUIN  Take 1 tablet (500 mg total) by mouth daily. tAKE FOR 4 DAYS THEN STOP.     losartan-hydrochlorothiazide 100-12.5 MG per tablet  Commonly known as:  HYZAAR  Take 1 tablet by mouth daily.     Potassium Gluconate 550 MG Tabs  Take 550 mg by mouth daily.     predniSONE 5 MG Tabs tablet  Commonly known as:  STERAPRED UNI-PAK  Take 5-10 mg by mouth See admin instructions. 10 mg twos times daily for two weeks then;  5mg  tab twice daily for 2 weeks ; then 5mg  tab daily for two weeks       Allergies  Allergen Reactions  . Biaxin [Clarithromycin]     Liver enzymes elevated per pt  . Sulfa Antibiotics Diarrhea       Follow-up Information   Follow up with [facility],  JESSICA D., PA-C On 08/27/2013. (10:30 am)    Specialty:  Gastroenterology   Contact information:   [place] [address] [phone]       Follow up with Collene Gobble., MD On 09/09/2013. (appointment time is 4 pm on July 9th, 2015)    Specialty:  Pulmonary Disease   Contact information:   [address] [place] [address] [phone]       Schedule an appointment as soon as possible for a visit in 1 week to follow up. (f/u with PCP in 1-2 weeks)        The results of significant diagnostics from this hospitalization (including imaging, microbiology, ancillary and laboratory) are listed below for reference.    Significant Diagnostic Studies: Dg Chest Port 1 View  08/14/2013   CLINICAL DATA:  Leukocytosis and cough.  Previous endoscopy.  EXAM: PORTABLE CHEST - 1 VIEW  COMPARISON:  None.  FINDINGS: No cardiomegaly. Aortic calcification. There is hypoaeration with interstitial crowding, decreasing the sensitivity of frontal radiography. Suspect small left pleural effusion. Thorax dextroscoliosis. No acute osseous findings.  IMPRESSION: Possible early pneumonia in the left lower lobe, likely with small effusion. At followup, PA and lateral radiography recommended.   Electronically Signed   By: Jorje Guild M.D.   On: 08/14/2013 13:12    Microbiology: Recent Results (from the past 240 hour(s))  MRSA PCR SCREENING     Status: None   Collection Time    08/13/13  4:15 PM      Result Value Ref Range Status   MRSA by PCR NEGATIVE  NEGATIVE Final   Comment:            The GeneXpert MRSA Assay (FDA     approved for NASAL specimens     only), is one component of a     comprehensive MRSA colonization     surveillance program. It is not     intended to diagnose MRSA     infection nor to guide or     monitor treatment for     MRSA infections.  URINE CULTURE     Status: None   Collection Time    08/14/13 10:41 PM      Result Value Ref Range Status   Specimen  Description URINE, CLEAN CATCH   Final   Special Requests NONE   Final   Culture  Setup Time     Final   Value: 08/15/2013 17:31  Performed at [place]     Final   Value: NO GROWTH     Performed at [facility]   Culture     Final   Value: NO GROWTH     Performed at [facility]   Report Status 08/16/2013 FINAL   Final     Labs: Basic Metabolic Panel:  Recent Labs Lab 08/13/13 2014 08/15/13 0415 08/16/13 0350 08/16/13 0807 08/17/13 0358 08/18/13 0330  NA 128* 128* 129* 131* 129* 130*  K 4.9 4.5 5.3 5.5* 4.6 4.6  CL 93* 94* 98 98 98 98  CO2 24 22 23 23 21 22   GLUCOSE 368* 501* 341* 310* 249* 230*  BUN 79* 45* 34* 32* 26* 27*  CREATININE 1.32* 1.24* 1.08 1.02 0.92 0.90  CALCIUM 9.0 8.6 8.9 9.0 8.8 8.5  MG 2.0  --   --   --   --   --    Liver Function Tests:  Recent Labs Lab 08/13/13 2014 08/15/13 0415  AST 106* 66*  ALT 262* 225*  ALKPHOS 62 56  BILITOT 0.6 0.6  PROT 5.3* 5.1*  ALBUMIN 2.8* 2.7*   No results found for this basename: LIPASE, AMYLASE,  in the last 168 hours No results found for this basename: AMMONIA,  in the last 168 hours CBC:  Recent Labs Lab 08/14/13 0313 08/15/13 0415 08/16/13 0807 08/17/13 0358 08/18/13 0330  WBC 16.0* 12.3* 11.5* 9.1 7.6  HGB 8.4* 6.9* 8.8* 8.4* 8.2*  HCT 24.9* 20.8* 25.7* 24.9* 24.9*  MCV 81.6 82.9 84.5 86.5 86.2  PLT 120* 96* 102* 87* 89*   Cardiac Enzymes: No results found for this basename: CKTOTAL, CKMB, CKMBINDEX, TROPONINI,  in the last 168 hours BNP: BNP (last 3 results) No results found for this basename: PROBNP,  in the last 8760 hours CBG:  Recent Labs Lab 08/17/13 0726 08/17/13 1201 08/17/13 1601 08/17/13 1959 08/18/13 0735  GLUCAP 214* 283* 273* 243* 253*       Signed:  THOMPSON,DANIEL MD [facility] 08/18/2013, 12:01 PM

## 2013-08-18 NOTE — Evaluation (Signed)
Physical Therapy Evaluation Patient Details Name: Cynthia Long MRN: [id number] DOB: 1940/09/11 Today's Date: 08/18/2013   History of Present Illness  73 yo female admitted with GI bleed, anemia. Hx of HTN, DM, bronchitis  Clinical Impression  On eval, pt was supervision level for mobility-able to ambulate ~350 feet without device. Tolerated well. No LOB. No further PT needs at this time. Will sign off.     Follow Up Recommendations No PT follow up    Equipment Recommendations  None recommended by PT    Recommendations for Other Services       Precautions / Restrictions Precautions Precautions: None Restrictions Weight Bearing Restrictions: No      Mobility  Bed Mobility Overal bed mobility: Independent                Transfers Overall transfer level: Modified independent                  Ambulation/Gait Ambulation/Gait assistance: Supervision Ambulation Distance (Feet): 350 Feet Assistive device: None Gait Pattern/deviations: Drifts right/left     General Gait Details: noted some drifting but no LOB  Stairs Stairs: Yes Stairs assistance: Supervision Stair Management: One rail Right;Forwards;Step to pattern;Alternating pattern Number of Stairs: 9    Wheelchair Mobility    Modified Rankin (Stroke Patients Only)       Balance                                             Pertinent Vitals/Pain No c/o pain except discomfort from [place] expects to be discharged to:: Private residence Living Arrangements: Alone   Type of Home: House Home Access: Stairs to enter Entrance Stairs-Rails: Right Entrance Stairs-Number of Steps: 10-12 Home Layout: One level Home Equipment: None      Prior Function Level of Independence: Independent               Hand Dominance        Extremity/Trunk Assessment   Upper Extremity Assessment: Overall WFL for tasks assessed           Lower  Extremity Assessment: Overall WFL for tasks assessed      Cervical / Trunk Assessment: Normal  Communication   Communication: No difficulties  Cognition Arousal/Alertness: Awake/alert Behavior During Therapy: WFL for tasks assessed/performed Overall Cognitive Status: Within Functional Limits for tasks assessed                      General Comments      Exercises        Assessment/Plan    PT Assessment [place] does not need any further PT services  PT Diagnosis     PT Problem List    PT Treatment Interventions     PT Goals (Current goals can be found in the Care Plan section) Acute Rehab PT Goals Patient Stated Goal: return to work/PLOF PT Goal Formulation: No goals set, d/c therapy    Frequency     Barriers to discharge        Co-evaluation               End of Session Equipment Utilized During Treatment: Gait belt Activity Tolerance: Patient tolerated treatment well Patient left: in bed;with call bell/phone within reach;with family/visitor present           Time: 5621-3086 PT Time  Calculation (min): 8 min   Charges:   PT Evaluation $Initial PT Evaluation Tier I: 1 Procedure PT Treatments $Gait Training: 8-22 mins   PT G Codes:          Weston Anna, MPT Pager: [phone]

## 2013-08-19 ENCOUNTER — Telehealth: Payer: Self-pay | Admitting: Gastroenterology

## 2013-08-19 NOTE — Telephone Encounter (Signed)
Spoke with patient's caregiver and told her PCP should fill out FMLA.  She states patient does not have PCP.

## 2013-08-27 ENCOUNTER — Other Ambulatory Visit (INDEPENDENT_AMBULATORY_CARE_PROVIDER_SITE_OTHER): Payer: PRIVATE HEALTH INSURANCE

## 2013-08-27 ENCOUNTER — Encounter: Payer: Self-pay | Admitting: Gastroenterology

## 2013-08-27 ENCOUNTER — Ambulatory Visit (INDEPENDENT_AMBULATORY_CARE_PROVIDER_SITE_OTHER): Payer: PRIVATE HEALTH INSURANCE | Admitting: Gastroenterology

## 2013-08-27 VITALS — BP 144/70 | HR 84 | Ht 63.0 in | Wt 155.0 lb

## 2013-08-27 DIAGNOSIS — R7989 Other specified abnormal findings of blood chemistry: Secondary | ICD-10-CM

## 2013-08-27 DIAGNOSIS — K922 Gastrointestinal hemorrhage, unspecified: Secondary | ICD-10-CM

## 2013-08-27 DIAGNOSIS — D649 Anemia, unspecified: Secondary | ICD-10-CM

## 2013-08-27 DIAGNOSIS — R188 Other ascites: Secondary | ICD-10-CM

## 2013-08-27 DIAGNOSIS — R945 Abnormal results of liver function studies: Secondary | ICD-10-CM

## 2013-08-27 LAB — COMPREHENSIVE METABOLIC PANEL
ALK PHOS: 128 U/L — AB (ref 39–117)
ALT: 41 U/L — AB (ref 0–35)
AST: 40 U/L — AB (ref 0–37)
Albumin: 3.4 g/dL — ABNORMAL LOW (ref 3.5–5.2)
BUN: 12 mg/dL (ref 6–23)
CO2: 31 mEq/L (ref 19–32)
CREATININE: 1 mg/dL (ref 0.4–1.2)
Calcium: 9 mg/dL (ref 8.4–10.5)
Chloride: 101 mEq/L (ref 96–112)
GFR: 61.71 mL/min (ref >60.00)
Glucose, Bld: 155 mg/dL — ABNORMAL HIGH (ref 70–99)
Potassium: 4.6 mEq/L (ref 3.5–5.1)
SODIUM: 137 meq/L (ref 135–145)
TOTAL PROTEIN: 6.6 g/dL (ref 6.0–8.3)
Total Bilirubin: 1 mg/dL (ref 0.2–1.2)

## 2013-08-27 MED ORDER — NA SULFATE-K SULFATE-MG SULF 17.5-3.13-1.6 GM/177ML PO SOLN: ORAL | Status: DC

## 2013-08-27 NOTE — Patient Instructions (Signed)
You have been scheduled for a colonoscopy with [name]. Please follow written instructions given to you at your visit today.  Please pick up your prep kit at the pharmacy within the next 1-3 days. If you use inhalers (even only as needed), please bring them with you on the day of your procedure. Your physician has requested that you go to www.startemmi.com and enter the access code given to you at your visit today. This web site gives a general overview about your procedure. However, you should still follow specific instructions given to you by our office regarding your preparation for the procedure.  You have been scheduled for an abdominal ultrasound at [facility] (1st floor of hospital) on 08-31-2013 at 8 am. Please arrive 15 minutes prior to your appointment for registration. Make certain not to have anything to eat or drink 6 hours prior to your appointment. Should you need to reschedule your appointment, please contact radiology at [phone]. This test typically takes about 30 minutes to perform.  Your physician has requested that you go to the basement for the following lab work before leaving today: CMET

## 2013-08-27 NOTE — Progress Notes (Signed)
     08/27/2013 Cynthia Long [id number] Aug 26, 1940   History of Present Illness:  This is a pleasant 73 year old female who was recently seen by our service in consult by [name] earlier this month after she was transferred here from [facility]. She had nausea, vomiting, diarrhea that was suspected to be enteric infection and treated with a course of Cipro and Flagyl. Stool pathogen panel ended up being negative. She also had melena, which was the reason for her hospital transfer. She underwent endoscopy by [name] on June 13, which was normal. The plan was to treat her expectantly and schedule colonoscopy as an outpatient. She thinks that she had a colonoscopy in [place] approximately 5 years ago, but we do not have those records currently.  Her hemoglobin actually dipped to about 6.9 grams during her hospitalization and she was given one unit of packed blood cells. She was discharged on June 17 with a hemoglobin of 8.2 grams. Followup labs by her PCP just 2 days ago showed an improvement in her hemoglobin to 9.4 grams.  Just of note, while she was hospitalized her LFTs were elevated with AST reaching 106, ALT 262, alkaline phosphatase remaining normal as well as total bilirubin remaining normal.  Hepatitis panel was negative. She has no known history of liver disease. At that time elevation is thought to be reactive as we had no previous comparison. Her platelets were low throughout her hospitalization, however. On most recent labs platelets continue to remain slightly low at 126.  We do not have any abdominal imaging on her.  She complains of Lower extremity swelling as well as her abdomen being "hard". She was placed on Lasix 20 mg daily by her PCP for the edema just since her hospitalization.   Current Medications, Allergies, Past Medical History, Past Surgical History, Family History and Social History were reviewed in [employer] record.   Physical Exam: BP  144/70  Pulse 84  Ht 5\' 3"  (1.6 m)  Wt 155 lb (70.308 kg)  BMI 27.46 kg/m2 General: Well developed white female in no acute distress Head: Normocephalic and atraumatic Eyes:  Sclerae anicteric, conjunctiva pink  Ears: Normal auditory acuity Lungs: Clear throughout to auscultation Heart: Regular rate and rhythm Abdomen: Soft, non-distended.  Normal bowel sounds.  Non-tender. Rectal:  Deferred.  Will be done at the time of colonoscopy. Musculoskeletal: Symmetrical with no gross deformities  Extremities: No edema  Neurological: Alert oriented x 4, grossly non-focal Psychological:  Alert and cooperative. Normal mood and affect  Assessment and Recommendations: -Anemia and heme positive stools:  EGD negative in hospital.  Schedule colonoscopy for completeness.  Hgb improved.   -Elevated LFT's:  Elevated in the hospital.  Will recheck LFT's today.  Also, due to complaints of LE edema and tense abdomen.  Will check abdominal ultrasound as well to evaluate liver and to rule out ascites.  Has no known history of liver disease.

## 2013-08-31 ENCOUNTER — Ambulatory Visit (HOSPITAL_COMMUNITY): Payer: PRIVATE HEALTH INSURANCE

## 2013-08-31 ENCOUNTER — Ambulatory Visit (HOSPITAL_COMMUNITY)
Admission: RE | Admit: 2013-08-31 | Discharge: 2013-08-31 | Disposition: A | Discharge status: Home / Self Care | Payer: PRIVATE HEALTH INSURANCE | Source: Ambulatory Visit | Attending: Gastroenterology | Admitting: Gastroenterology

## 2013-08-31 DIAGNOSIS — R188 Other ascites: Secondary | ICD-10-CM

## 2013-08-31 DIAGNOSIS — R109 Unspecified abdominal pain: Secondary | ICD-10-CM | POA: Insufficient documentation

## 2013-08-31 DIAGNOSIS — R141 Gas pain: Secondary | ICD-10-CM | POA: Diagnosis not present

## 2013-08-31 DIAGNOSIS — R945 Abnormal results of liver function studies: Secondary | ICD-10-CM | POA: Diagnosis not present

## 2013-08-31 DIAGNOSIS — R142 Eructation: Secondary | ICD-10-CM

## 2013-08-31 DIAGNOSIS — R143 Flatulence: Secondary | ICD-10-CM

## 2013-08-31 DIAGNOSIS — K829 Disease of gallbladder, unspecified: Secondary | ICD-10-CM | POA: Diagnosis not present

## 2013-09-06 NOTE — Progress Notes (Signed)
Reviewed and agree with management. Palmer Shorey D. Viliami Bracco, M.D., FACG  

## 2013-09-07 ENCOUNTER — Ambulatory Visit: Payer: PRIVATE HEALTH INSURANCE | Admitting: Gastroenterology

## 2013-09-08 ENCOUNTER — Other Ambulatory Visit (INDEPENDENT_AMBULATORY_CARE_PROVIDER_SITE_OTHER): Payer: PRIVATE HEALTH INSURANCE

## 2013-09-08 ENCOUNTER — Ambulatory Visit (INDEPENDENT_AMBULATORY_CARE_PROVIDER_SITE_OTHER): Payer: PRIVATE HEALTH INSURANCE | Admitting: Gastroenterology

## 2013-09-08 ENCOUNTER — Encounter: Payer: Self-pay | Admitting: Gastroenterology

## 2013-09-08 VITALS — BP 148/78 | HR 98 | Ht 63.0 in | Wt 161.4 lb

## 2013-09-08 DIAGNOSIS — R188 Other ascites: Secondary | ICD-10-CM

## 2013-09-08 DIAGNOSIS — K7460 Unspecified cirrhosis of liver: Secondary | ICD-10-CM

## 2013-09-08 LAB — IBC PANEL
IRON: 28 ug/dL — AB (ref 42–145)
SATURATION RATIOS: 6.6 % — AB (ref 20.0–50.0)
TRANSFERRIN: 302.1 mg/dL (ref 212.0–360.0)

## 2013-09-08 LAB — BASIC METABOLIC PANEL
BUN: 11 mg/dL (ref 6–23)
CALCIUM: 8.9 mg/dL (ref 8.4–10.5)
CO2: 25 mEq/L (ref 19–32)
CREATININE: 0.9 mg/dL (ref 0.4–1.2)
Chloride: 101 mEq/L (ref 96–112)
GFR: 64.84 mL/min (ref >60.00)
GLUCOSE: 126 mg/dL — AB (ref 70–99)
Potassium: 3.3 mEq/L — ABNORMAL LOW (ref 3.5–5.1)
Sodium: 137 mEq/L (ref 135–145)

## 2013-09-08 LAB — IGA: IgA: 249 mg/dL (ref 68–378)

## 2013-09-08 LAB — FERRITIN: Ferritin: 16 ng/mL (ref 10.0–291.0)

## 2013-09-08 MED ORDER — SPIRONOLACTONE 100 MG PO TABS: 100.0000 mg | ORAL_TABLET | Freq: Every day | ORAL | Status: DC

## 2013-09-08 MED ORDER — FUROSEMIDE 40 MG PO TABS: 40.0000 mg | ORAL_TABLET | Freq: Every day | ORAL | Status: DC

## 2013-09-08 NOTE — Patient Instructions (Addendum)
You will go to the basement for labs today.  We have sent the following medications to your pharmacy for you to pick up at your convenience: Lasix 40 mg  Aldactone 100 mg   Please stop your Potassium   Lab order printed for BMP to be done next Wednesday in [place]  Fax number for lab result to be sent to is: [phone]  You have been scheduled for a Paracentesis at [facility] on 09-16-2013 arrive at 1045 AM. Please go to first floor Radiology Department.  You have a follow up visit scheduled with [name] on 11-19-2013 at 845 AM.

## 2013-09-09 ENCOUNTER — Institutional Professional Consult (permissible substitution): Payer: PRIVATE HEALTH INSURANCE | Admitting: Emergency Medicine

## 2013-09-09 LAB — ANA: Anti Nuclear Antibody(ANA): NEGATIVE

## 2013-09-09 LAB — TISSUE TRANSGLUTAMINASE, IGA: Tissue Transglutaminase Ab, IgA: 3.4 U/mL

## 2013-09-10 ENCOUNTER — Telehealth: Payer: Self-pay | Admitting: *Deleted

## 2013-09-10 LAB — MITOCHONDRIAL ANTIBODIES: MITOCHONDRIAL M2 AB, IGG: 0.2 (ref <0.91)

## 2013-09-10 LAB — ANTI-SMOOTH MUSCLE/MITOCHOND.
Mitochondrial Ab: 4 Units (ref 0.0–20.0)
Smooth Muscle Ab: 15 Units (ref 0–19)

## 2013-09-10 LAB — CERULOPLASMIN: CERULOPLASMIN: 33 mg/dL (ref 18–53)

## 2013-09-10 LAB — ALPHA-1-ANTITRYPSIN: A-1 Antitrypsin, Ser: 212 mg/dL — ABNORMAL HIGH (ref 83–199)

## 2013-09-10 NOTE — Telephone Encounter (Signed)
Patient called and asked about changing paracentesis from 09/16/13 to 09/15/13 due to transportation issues. Called short stay and they do not have anything on 09/15/13. Patient notified and she states she thinks she has it worked out now.

## 2013-09-15 ENCOUNTER — Encounter: Payer: Self-pay | Admitting: Gastroenterology

## 2013-09-15 DIAGNOSIS — K7460 Unspecified cirrhosis of liver: Secondary | ICD-10-CM | POA: Insufficient documentation

## 2013-09-15 DIAGNOSIS — R188 Other ascites: Secondary | ICD-10-CM | POA: Insufficient documentation

## 2013-09-15 NOTE — Progress Notes (Signed)
     09/15/2013 Cynthia Long [id number] [birth year]   History of Present Illness:  This is a pleasant 73 year old female who is new to our practice and was seen during recent hospitalization.  During her hospitalization she was noted to have elevated LFT's.  Hepatitis panel was negative and she had no known liver disease.  When I saw her in follow-up on 6/26, she had what appeared to be gross ascites and LE edema.  Her pulmonologist had placed her on lasix 20 mg daily.  I ordered an ultrasound, which revealed moderate ascites, cholelithiasis, appearance of liver c/w cirrhosis but no focal lesions identified, and splenomegaly on 08/31/2013.  She is here again today for follow-up.  I spoke with her pulmonologist, Dr. Davina Poke, and he had some labs drawn for [place].  AFP on 09/06/2013 was 5.8.  Her potassium was 3.2 after being placed on the lasix 20 mg so she is now taking potassium gluconate 550 mg daily.  LFT's have normalized with total bili 1.0, AST 35, ALT 34, and ALP is only slightly elevated at 137.  Platelets are 92.  Hgb is stable at 8.6 grams.  She is scheduled for a colonoscopy on 8/18 with [name].  She underwent endoscopy by [name] on June 13, which was normal at that time with no sign of portal gastropathy or varices noted.  INR 1.2  Her only complaint today is of the fluid accumulation in her abdomen and her LE's.  Says that she has gained close to 10 pounds.  Having some SOB with exertion due to fluid.  She was here today with her daughter.   Current Medications, Allergies, Past Medical History, Past Surgical History, Family History and Social History were reviewed in [employer] record.   Physical Exam: BP 148/78  Pulse 98  Ht 5\' 3"  (1.6 m)  Wt 161 lb 6.4 oz (73.211 kg)  BMI 28.60 kg/m2 General: Well developed white female in no acute distress Head: Normocephalic and atraumatic Eyes:  Sclerae anicteric, conjunctiva pink  Ears: Normal auditory  acuity Lungs: Clear throughout to auscultation Heart: Regular rate and rhythm Abdomen:  Obviously distended with ascites.  Normal bowel sounds.  Non-tender. Musculoskeletal: Symmetrical with no gross deformities  Extremities:  +2 pitting edema in B/L LE's Neurological: Alert oriented x 4, grossly non-focal Psychological:  Alert and cooperative. 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  Will check full liver serology panel to rule out causes of cirrhosis.  Will increase lasix to 40 mg daily and start aldactone at 100 mg daily.  She will discontinue her potassium for now.  Will order paracentesis with up to 4 Liters removed.  Will give 25 grams of albumin and check fluid for cell count/gram stain, cytology, and culture.  Will repeat BMP in one week.

## 2013-09-16 ENCOUNTER — Ambulatory Visit (HOSPITAL_COMMUNITY)
Admission: RE | Admit: 2013-09-16 | Discharge: 2013-09-16 | Disposition: A | Discharge status: Home / Self Care | Payer: PRIVATE HEALTH INSURANCE | Source: Ambulatory Visit | Attending: Gastroenterology | Admitting: Gastroenterology

## 2013-09-16 ENCOUNTER — Ambulatory Visit (HOSPITAL_COMMUNITY): Payer: PRIVATE HEALTH INSURANCE

## 2013-09-16 ENCOUNTER — Encounter (HOSPITAL_COMMUNITY): Payer: PRIVATE HEALTH INSURANCE

## 2013-09-16 DIAGNOSIS — K7460 Unspecified cirrhosis of liver: Secondary | ICD-10-CM | POA: Diagnosis not present

## 2013-09-16 DIAGNOSIS — R188 Other ascites: Secondary | ICD-10-CM | POA: Diagnosis present

## 2013-09-16 LAB — BODY FLUID CELL COUNT WITH DIFFERENTIAL
Lymphs, Fluid: 68 %
Monocyte-Macrophage-Serous Fluid: 21 % — ABNORMAL LOW (ref 50–90)
Neutrophil Count, Fluid: 11 % (ref 0–25)
Total Nucleated Cell Count, Fluid: 296 cu mm (ref 0–1000)

## 2013-09-16 MED ORDER — ALBUMIN HUMAN 25 % IV SOLN
25.0000 g | Freq: Once | INTRAVENOUS | Status: AC
  Administered 2013-09-16: 25 g via INTRAVENOUS
  Filled 2013-09-16: qty 100

## 2013-09-16 NOTE — Progress Notes (Signed)
Reviewed and agree with management. Darvin Dials D. Dandra Velardi, M.D., FACG  

## 2013-09-19 LAB — BODY FLUID CULTURE
CULTURE: NO GROWTH
GRAM STAIN: NONE SEEN

## 2013-09-22 ENCOUNTER — Encounter: Payer: PRIVATE HEALTH INSURANCE | Admitting: Gastroenterology

## 2013-10-04 ENCOUNTER — Telehealth: Payer: Self-pay | Admitting: *Deleted

## 2013-10-04 DIAGNOSIS — K7460 Unspecified cirrhosis of liver: Secondary | ICD-10-CM

## 2013-10-04 NOTE — Telephone Encounter (Signed)
Message copied by Hulan Saas on Mon Oct 04, 2013  8:47 AM ------      Message from: Alonza Bogus D      Created: Fri Oct 01, 2013  5:46 PM      Regarding: Results       Please let the patient know that I got her lab results today, July 31, from when she had the, drawn 10 days ago on July 21. Everything looks great. All her electrolytes and kidney function are normal. Her hemoglobin is slowly increasing, it was 9.1 grams. How is she doing on the diuretics? She should be on Lasix 40 mg daily and spironolactone 100 mg daily.  Has she been monitoring her weight? Has it been up, down, or stable?            Thank you,            Jess ------

## 2013-10-04 NOTE — Telephone Encounter (Signed)
Spoke with patient and gave her recommendations. She will get BMET in [place] and have it faxed to [place].

## 2013-10-04 NOTE — Telephone Encounter (Signed)
Patient given results. She states her feet and ankles are back to normal. She saw her pulmonary MD and he stopped her Lasix. He told her to only take it if her ankles swell. She is still on Spironolactone 100 mg daily. Her weight is 140 lbs- stable. She has a cough that she did discuss with her pulmonologist.

## 2013-10-04 NOTE — Addendum Note (Signed)
Addended by: Hulan Saas on: 10/04/2013 11:45 AM   Modules accepted: Orders

## 2013-10-04 NOTE — Telephone Encounter (Signed)
The purpose of the diuretics are to prevent re-accumulation of the fluid.  She can try with just staying on the spironolactone daily and taking Lasix as needed, but we may need to put her back on the lasix regularly if she starts accumulating a lot of fluid again.  With just taking the spironolactone we have to monitor her potassium closely to make sure it does not get too high, so let's have a BMP repeated next week.  Thank you,  Janett Billow

## 2013-10-07 ENCOUNTER — Telehealth: Payer: Self-pay | Admitting: *Deleted

## 2013-10-07 DIAGNOSIS — K7460 Unspecified cirrhosis of liver: Secondary | ICD-10-CM

## 2013-10-07 NOTE — Telephone Encounter (Signed)
Patient given results and recommendations. 

## 2013-10-07 NOTE — Telephone Encounter (Signed)
Message copied by Hulan Saas on Thu Oct 07, 2013  1:22 PM ------      Message from: Alonza Bogus D      Created: Thu Oct 07, 2013  1:08 PM      Regarding: Labs       Patient had labs done on 8/5 despite me wanting them done next week, 8/10.  Labs look fine though, potassium is good.  Sodium just a little low.  Let's get a repeat BMP again in 2 weeks (she can get them done here the day of her colonoscopy).            Thank you,            Jess ------

## 2013-10-08 ENCOUNTER — Telehealth: Payer: Self-pay | Admitting: Gastroenterology

## 2013-10-08 ENCOUNTER — Encounter (HOSPITAL_COMMUNITY): Payer: Self-pay | Admitting: Emergency Medicine

## 2013-10-08 ENCOUNTER — Inpatient Hospital Stay (HOSPITAL_COMMUNITY)
Admission: EM | Admit: 2013-10-08 | Discharge: 2013-10-11 | DRG: 378 | Disposition: A | Discharge status: Home / Self Care | Payer: PRIVATE HEALTH INSURANCE | Attending: Family Medicine | Admitting: Family Medicine

## 2013-10-08 ENCOUNTER — Encounter (HOSPITAL_COMMUNITY)
Admission: EM | Disposition: A | Discharge status: Home / Self Care | Payer: Self-pay | Source: Home / Self Care | Attending: Internal Medicine

## 2013-10-08 DIAGNOSIS — K7689 Other specified diseases of liver: Secondary | ICD-10-CM | POA: Diagnosis present

## 2013-10-08 DIAGNOSIS — D696 Thrombocytopenia, unspecified: Secondary | ICD-10-CM | POA: Diagnosis present

## 2013-10-08 DIAGNOSIS — R188 Other ascites: Secondary | ICD-10-CM

## 2013-10-08 DIAGNOSIS — K222 Esophageal obstruction: Secondary | ICD-10-CM | POA: Diagnosis present

## 2013-10-08 DIAGNOSIS — E871 Hypo-osmolality and hyponatremia: Secondary | ICD-10-CM | POA: Diagnosis present

## 2013-10-08 DIAGNOSIS — D509 Iron deficiency anemia, unspecified: Secondary | ICD-10-CM | POA: Diagnosis present

## 2013-10-08 DIAGNOSIS — E119 Type 2 diabetes mellitus without complications: Secondary | ICD-10-CM | POA: Diagnosis present

## 2013-10-08 DIAGNOSIS — I10 Essential (primary) hypertension: Secondary | ICD-10-CM | POA: Diagnosis present

## 2013-10-08 DIAGNOSIS — D62 Acute posthemorrhagic anemia: Secondary | ICD-10-CM

## 2013-10-08 DIAGNOSIS — R161 Splenomegaly, not elsewhere classified: Secondary | ICD-10-CM | POA: Diagnosis present

## 2013-10-08 DIAGNOSIS — I8510 Secondary esophageal varices without bleeding: Secondary | ICD-10-CM | POA: Diagnosis present

## 2013-10-08 DIAGNOSIS — J45998 Other asthma: Secondary | ICD-10-CM

## 2013-10-08 DIAGNOSIS — R7989 Other specified abnormal findings of blood chemistry: Secondary | ICD-10-CM

## 2013-10-08 DIAGNOSIS — K319 Disease of stomach and duodenum, unspecified: Secondary | ICD-10-CM | POA: Diagnosis present

## 2013-10-08 DIAGNOSIS — E872 Acidosis, unspecified: Secondary | ICD-10-CM

## 2013-10-08 DIAGNOSIS — K922 Gastrointestinal hemorrhage, unspecified: Secondary | ICD-10-CM

## 2013-10-08 DIAGNOSIS — Z87891 Personal history of nicotine dependence: Secondary | ICD-10-CM | POA: Diagnosis not present

## 2013-10-08 DIAGNOSIS — K921 Melena: Secondary | ICD-10-CM

## 2013-10-08 DIAGNOSIS — K7460 Unspecified cirrhosis of liver: Secondary | ICD-10-CM

## 2013-10-08 DIAGNOSIS — K2970 Gastritis, unspecified, without bleeding: Secondary | ICD-10-CM | POA: Diagnosis present

## 2013-10-08 DIAGNOSIS — R112 Nausea with vomiting, unspecified: Secondary | ICD-10-CM | POA: Diagnosis present

## 2013-10-08 DIAGNOSIS — D649 Anemia, unspecified: Secondary | ICD-10-CM

## 2013-10-08 DIAGNOSIS — K920 Hematemesis: Secondary | ICD-10-CM | POA: Diagnosis present

## 2013-10-08 DIAGNOSIS — K766 Portal hypertension: Secondary | ICD-10-CM | POA: Diagnosis present

## 2013-10-08 DIAGNOSIS — E8809 Other disorders of plasma-protein metabolism, not elsewhere classified: Secondary | ICD-10-CM | POA: Diagnosis present

## 2013-10-08 DIAGNOSIS — K529 Noninfective gastroenteritis and colitis, unspecified: Secondary | ICD-10-CM

## 2013-10-08 DIAGNOSIS — K2990 Gastroduodenitis, unspecified, without bleeding: Secondary | ICD-10-CM

## 2013-10-08 DIAGNOSIS — R945 Abnormal results of liver function studies: Secondary | ICD-10-CM

## 2013-10-08 DIAGNOSIS — K449 Diaphragmatic hernia without obstruction or gangrene: Secondary | ICD-10-CM | POA: Diagnosis present

## 2013-10-08 HISTORY — PX: ESOPHAGOGASTRODUODENOSCOPY: SHX5428

## 2013-10-08 HISTORY — DX: Whooping cough, unspecified species without pneumonia: A37.90

## 2013-10-08 HISTORY — DX: Gastrointestinal hemorrhage, unspecified: K92.2

## 2013-10-08 LAB — HEPATIC FUNCTION PANEL
ALBUMIN: 3.4 g/dL — AB (ref 3.5–5.2)
ALT: 25 U/L (ref 0–35)
AST: 30 U/L (ref 0–37)
Alkaline Phosphatase: 105 U/L (ref 39–117)
Bilirubin, Direct: 0.3 mg/dL (ref 0.0–0.3)
Indirect Bilirubin: 0.7 mg/dL (ref 0.3–0.9)
TOTAL PROTEIN: 6.6 g/dL (ref 6.0–8.3)
Total Bilirubin: 1 mg/dL (ref 0.3–1.2)

## 2013-10-08 LAB — CBC
HCT: 18 % — ABNORMAL LOW (ref 36.0–46.0)
HEMOGLOBIN: 5.6 g/dL — AB (ref 12.0–15.0)
MCH: 24.8 pg — ABNORMAL LOW (ref 26.0–34.0)
MCHC: 31.1 g/dL (ref 30.0–36.0)
MCV: 79.6 fL (ref 78.0–100.0)
Platelets: 96 10*3/uL — ABNORMAL LOW (ref 150–400)
RBC: 2.26 MIL/uL — ABNORMAL LOW (ref 3.87–5.11)
RDW: 17.2 % — ABNORMAL HIGH (ref 11.5–15.5)
WBC: 5.8 10*3/uL (ref 4.0–10.5)

## 2013-10-08 LAB — CBC WITH DIFFERENTIAL/PLATELET
Basophils Absolute: 0 10*3/uL (ref 0.0–0.1)
Basophils Relative: 0 % (ref 0–1)
Eosinophils Absolute: 0 10*3/uL (ref 0.0–0.7)
Eosinophils Relative: 0 % (ref 0–5)
HCT: 22.3 % — ABNORMAL LOW (ref 36.0–46.0)
Hemoglobin: 6.9 g/dL — CL (ref 12.0–15.0)
LYMPHS PCT: 14 % (ref 12–46)
Lymphs Abs: 0.7 10*3/uL (ref 0.7–4.0)
MCH: 24.8 pg — ABNORMAL LOW (ref 26.0–34.0)
MCHC: 30.9 g/dL (ref 30.0–36.0)
MCV: 80.2 fL (ref 78.0–100.0)
MONOS PCT: 6 % (ref 3–12)
Monocytes Absolute: 0.3 10*3/uL (ref 0.1–1.0)
NEUTROS PCT: 80 % — AB (ref 43–77)
Neutro Abs: 4 10*3/uL (ref 1.7–7.7)
Platelets: 102 10*3/uL — ABNORMAL LOW (ref 150–400)
RBC: 2.78 MIL/uL — ABNORMAL LOW (ref 3.87–5.11)
RDW: 17 % — AB (ref 11.5–15.5)
WBC: 5 10*3/uL (ref 4.0–10.5)

## 2013-10-08 LAB — LIPASE, BLOOD: Lipase: 49 U/L (ref 11–59)

## 2013-10-08 LAB — PREPARE RBC (CROSSMATCH)

## 2013-10-08 LAB — BASIC METABOLIC PANEL
ANION GAP: 14 (ref 5–15)
BUN: 31 mg/dL — ABNORMAL HIGH (ref 6–23)
CHLORIDE: 95 meq/L — AB (ref 96–112)
CO2: 21 mEq/L (ref 19–32)
Calcium: 10.2 mg/dL (ref 8.4–10.5)
Creatinine, Ser: 0.81 mg/dL (ref 0.50–1.10)
GFR calc Af Amer: 83 mL/min — ABNORMAL LOW (ref >90)
GFR calc non Af Amer: 72 mL/min — ABNORMAL LOW (ref >90)
Glucose, Bld: 248 mg/dL — ABNORMAL HIGH (ref 70–99)
POTASSIUM: 5 meq/L (ref 3.7–5.3)
Sodium: 130 mEq/L — ABNORMAL LOW (ref 137–147)

## 2013-10-08 LAB — URINALYSIS W MICROSCOPIC (NOT AT ARMC)
Bilirubin Urine: NEGATIVE
GLUCOSE, UA: NEGATIVE mg/dL
Hgb urine dipstick: NEGATIVE
KETONES UR: NEGATIVE mg/dL
NITRITE: NEGATIVE
PH: 6 (ref 5.0–8.0)
Protein, ur: NEGATIVE mg/dL
SPECIFIC GRAVITY, URINE: 1.012 (ref 1.005–1.030)
Urobilinogen, UA: 0.2 mg/dL (ref 0.0–1.0)

## 2013-10-08 LAB — PROTIME-INR
INR: 1.29 (ref 0.00–1.49)
Prothrombin Time: 16.1 seconds — ABNORMAL HIGH (ref 11.6–15.2)

## 2013-10-08 LAB — AMMONIA: Ammonia: 26 umol/L (ref 11–60)

## 2013-10-08 LAB — I-STAT CG4 LACTIC ACID, ED: Lactic Acid, Venous: 2.75 mmol/L — ABNORMAL HIGH (ref 0.5–2.2)

## 2013-10-08 SURGERY — EGD (ESOPHAGOGASTRODUODENOSCOPY)
Anesthesia: Moderate Sedation

## 2013-10-08 MED ORDER — FENTANYL CITRATE 0.05 MG/ML IJ SOLN
INTRAMUSCULAR | Status: AC
  Filled 2013-10-08: qty 2

## 2013-10-08 MED ORDER — MIDAZOLAM HCL 10 MG/2ML IJ SOLN
INTRAMUSCULAR | Status: AC
  Filled 2013-10-08: qty 4

## 2013-10-08 MED ORDER — SODIUM CHLORIDE 0.9 % IV SOLN: INTRAVENOUS | Status: DC

## 2013-10-08 MED ORDER — OXYCODONE HCL 5 MG PO TABS
5.0000 mg | ORAL_TABLET | ORAL | Status: DC | PRN
  Filled 2013-10-08: qty 1

## 2013-10-08 MED ORDER — SODIUM CHLORIDE 0.9 % IV BOLUS (SEPSIS)
1000.0000 mL | Freq: Once | INTRAVENOUS | Status: AC
  Administered 2013-10-08: 1000 mL via INTRAVENOUS

## 2013-10-08 MED ORDER — ONDANSETRON HCL 4 MG/2ML IJ SOLN
4.0000 mg | Freq: Once | INTRAMUSCULAR | Status: AC
  Administered 2013-10-08: 4 mg via INTRAVENOUS
  Filled 2013-10-08: qty 2

## 2013-10-08 MED ORDER — SODIUM CHLORIDE 0.9 % IV SOLN
Freq: Once | INTRAVENOUS | Status: AC
  Administered 2013-10-08: 13:00:00 via INTRAVENOUS

## 2013-10-08 MED ORDER — METOCLOPRAMIDE HCL 5 MG/ML IJ SOLN
10.0000 mg | Freq: Once | INTRAMUSCULAR | Status: AC
  Administered 2013-10-08: 10 mg via INTRAVENOUS
  Filled 2013-10-08: qty 2

## 2013-10-08 MED ORDER — CETYLPYRIDINIUM CHLORIDE 0.05 % MT LIQD
7.0000 mL | Freq: Two times a day (BID) | OROMUCOSAL | Status: DC
  Administered 2013-10-09 – 2013-10-10 (×5): 7 mL via OROMUCOSAL

## 2013-10-08 MED ORDER — CHLORHEXIDINE GLUCONATE 0.12 % MT SOLN
15.0000 mL | Freq: Two times a day (BID) | OROMUCOSAL | Status: DC
  Administered 2013-10-08 – 2013-10-11 (×5): 15 mL via OROMUCOSAL
  Filled 2013-10-08 (×9): qty 15

## 2013-10-08 MED ORDER — PANTOPRAZOLE SODIUM 40 MG IV SOLR
40.0000 mg | Freq: Once | INTRAVENOUS | Status: AC
  Administered 2013-10-08: 40 mg via INTRAVENOUS
  Filled 2013-10-08: qty 40

## 2013-10-08 MED ORDER — MIDAZOLAM HCL 10 MG/2ML IJ SOLN
INTRAMUSCULAR | Status: DC | PRN
  Administered 2013-10-08 (×2): 1 mg via INTRAVENOUS
  Administered 2013-10-08: 2 mg via INTRAVENOUS

## 2013-10-08 MED ORDER — PANTOPRAZOLE SODIUM 40 MG IV SOLR
40.0000 mg | Freq: Two times a day (BID) | INTRAVENOUS | Status: DC
  Administered 2013-10-08 – 2013-10-11 (×7): 40 mg via INTRAVENOUS
  Filled 2013-10-08 (×9): qty 40

## 2013-10-08 MED ORDER — BUTAMBEN-TETRACAINE-BENZOCAINE 2-2-14 % EX AERO
INHALATION_SPRAY | CUTANEOUS | Status: DC | PRN
  Administered 2013-10-08: 2 via TOPICAL

## 2013-10-08 MED ORDER — MORPHINE SULFATE 2 MG/ML IJ SOLN: 1.0000 mg | INTRAMUSCULAR | Status: DC | PRN

## 2013-10-08 MED ORDER — SODIUM CHLORIDE 0.9 % IJ SOLN
3.0000 mL | Freq: Two times a day (BID) | INTRAMUSCULAR | Status: DC
Start: 2013-10-08 — End: 2013-10-11
  Administered 2013-10-09: 3 mL via INTRAVENOUS

## 2013-10-08 MED ORDER — FENTANYL CITRATE 0.05 MG/ML IJ SOLN
INTRAMUSCULAR | Status: DC | PRN
  Administered 2013-10-08: 12.5 ug via INTRAVENOUS
  Administered 2013-10-08: 25 ug via INTRAVENOUS
  Administered 2013-10-08: 12.5 ug via INTRAVENOUS

## 2013-10-08 MED ORDER — FUROSEMIDE 10 MG/ML IJ SOLN
20.0000 mg | Freq: Once | INTRAMUSCULAR | Status: AC
  Administered 2013-10-09: 20 mg via INTRAVENOUS
  Filled 2013-10-08: qty 2

## 2013-10-08 MED ORDER — SODIUM CHLORIDE 0.9 % IV SOLN
Freq: Once | INTRAVENOUS | Status: AC
  Administered 2013-10-09: via INTRAVENOUS

## 2013-10-08 MED ORDER — SODIUM CHLORIDE 0.9 % IV SOLN
INTRAVENOUS | Status: DC
Start: 2013-10-08 — End: 2013-10-11
  Administered 2013-10-08 – 2013-10-11 (×5): via INTRAVENOUS

## 2013-10-08 NOTE — ED Provider Notes (Signed)
CSN: [id number]     Arrival date & time 10/08/13  1023 History   First MD Initiated Contact with Patient 10/08/13 1029     Chief Complaint  Patient presents with  . Emesis  . Nausea      HPI  Patient presents with vomiting of black emesis since last night. Concerned because last time this happened in June she was hospitalized, diagnosed with  NASH.  Had normal EGD and Normal stool studies.  Thought to perhaps have enteric infection and, and given 3d of Cipro/Flagyl and bleeding resolved and was discharged.   Has been well at home. However, started having emesis last night it was "black", is weak and dizzy. Presents here.   Past Medical History  Diagnosis Date  . Hypertension   . Dysrhythmia   . Diabetes mellitus without complication   . Headache(784.0)   . 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 Pertussis 2011  . GI bleed 08/2013, 10/2013    EGD negative 08/2013.    Past Surgical History  Procedure Laterality Date  . Abdominal hysterectomy      partial, for prolapsed uterus.  . Esophagogastroduodenoscopy N/A 08/14/2013    Procedure: ESOPHAGOGASTRODUODENOSCOPY (EGD);  Surgeon: Inda Castle, MD;  Location: [facility];  Service: Endoscopy;  Laterality: N/A;   History reviewed. No pertinent family history. History  Substance Use Topics  . Smoking status: Former Smoker    Types: Cigarettes    Quit date: 10/08/1961  . Smokeless tobacco: Never Used  . Alcohol Use: No   OB History   Grav Para Term Preterm Abortions TAB SAB Ect Mult Living                 Review of Systems  Constitutional: Negative for fever, chills, diaphoresis, appetite change and fatigue.  HENT: Negative for mouth sores, sore throat and trouble swallowing.   Eyes: Negative for visual disturbance.  Respiratory: Negative for cough, chest tightness, shortness of breath and wheezing.   Cardiovascular: Negative for chest pain.  Gastrointestinal: Negative for nausea, vomiting, abdominal pain, diarrhea and  abdominal distention.  Endocrine: Negative for polydipsia, polyphagia and polyuria.  Genitourinary: Negative for dysuria, frequency and hematuria.  Musculoskeletal: Negative for gait problem.  Skin: Negative for color change, pallor and rash.  Neurological: Negative for dizziness, syncope, light-headedness and headaches.  Hematological: Does not bruise/bleed easily.  Psychiatric/Behavioral: Negative for behavioral problems and confusion.      Allergies  Biaxin and Sulfa antibiotics  Home Medications   Prior to Admission medications   Medication Sig Start Date End Date Taking? Authorizing Provider  aspirin EC 81 MG tablet Take 81 mg by mouth daily.   Yes Historical Provider, MD  budesonide (PULMICORT) 0.5 MG/2ML nebulizer solution Take 0.5 mg by nebulization daily as needed (for shortness of breath).   Yes Historical Provider, MD  calcium-vitamin D (OSCAL WITH D) 250-125 MG-UNIT per tablet Take 6 tablets by mouth daily.   Yes Historical Provider, MD  cromolyn (INTAL) 20 MG/2ML nebulizer solution Take 20 mg by nebulization daily as needed for wheezing.   Yes Historical Provider, MD  diltiazem (DILACOR XR) 120 MG 24 hr capsule Take 120 mg by mouth daily.   Yes Historical Provider, MD  glimepiride (AMARYL) 4 MG tablet Take 4 mg by mouth daily with breakfast.    Yes Historical Provider, MD  PRESCRIPTION MEDICATION every Wednesday. Allergy Shots   Yes Historical Provider, MD  Saxagliptin-Metformin (KOMBIGLYZE XR) 07-998 MG TB24 Take 1 tablet by  mouth daily.   Yes Historical Provider, MD  spironolactone (ALDACTONE) 100 MG tablet Take 1 tablet (100 mg total) by mouth daily. 09/08/13  Yes Jessica D. Zehr, PA-C  Na Sulfate-K Sulfate-Mg Sulf (SUPREP BOWEL PREP) SOLN USE PER PREP INSTRUCTIONS 08/27/13   Laban Emperor. Zehr, PA-C   BP 152/46  Pulse 104  Temp(Src) 98.2 F (36.8 C) (Oral)  Resp 16  Ht 5\' 3"  (1.6 m)  Wt 136 lb 6.4 oz (61.871 kg)  BMI 24.17 kg/m2  SpO2 98% Physical Exam   Constitutional: She is oriented to person, place, and time. She appears well-developed and well-nourished. No distress.  HENT:  Head: Normocephalic.  Eyes: Conjunctivae are normal. Pupils are equal, round, and reactive to light. No scleral icterus.  Conjunctiva are pale  Neck: Normal range of motion. Neck supple. No thyromegaly present.  Cardiovascular: Normal rate and regular rhythm.  Exam reveals no gallop and no friction rub.   No murmur heard. Pulmonary/Chest: Effort normal and breath sounds normal. No respiratory distress. She has no wheezes. She has no rales.  Abdominal: Soft. Bowel sounds are normal. She exhibits no distension. There is no tenderness. There is no rebound.  Musculoskeletal: Normal range of motion.  Neurological: She is alert and oriented to person, place, and time.  Skin: Skin is warm and dry. No rash noted.  Psychiatric: She has a normal mood and affect. Her behavior is normal.    ED Course  Procedures (including critical care time) Labs Review Labs Reviewed  CBC WITH DIFFERENTIAL - Abnormal; Notable for the following:    RBC 2.78 (*)    Hemoglobin 6.9 (*)    HCT 22.3 (*)    MCH 24.8 (*)    RDW 17.0 (*)    Platelets 102 (*)    Neutrophils Relative % 80 (*)    All other components within normal limits  BASIC METABOLIC PANEL - Abnormal; Notable for the following:    Sodium 130 (*)    Chloride 95 (*)    Glucose, Bld 248 (*)    BUN 31 (*)    GFR calc non Af Amer 72 (*)    GFR calc Af Amer 83 (*)    All other components within normal limits  PROTIME-INR - Abnormal; Notable for the following:    Prothrombin Time 16.1 (*)    All other components within normal limits  HEPATIC FUNCTION PANEL - Abnormal; Notable for the following:    Albumin 3.4 (*)    All other components within normal limits  I-STAT CG4 LACTIC ACID, ED - Abnormal; Notable for the following:    Lactic Acid, Venous 2.75 (*)    All other components within normal limits  LIPASE, BLOOD   AMMONIA  URINALYSIS W MICROSCOPIC  CBC  TYPE AND SCREEN    Imaging Review No results found.   EKG Interpretation None      MDM   Final diagnoses:  Upper GI bleed    Hemoglobin is 6.9. Her discharge hemoglobin was 8.2. Is not having pain. Is not icteric. Given 400cc ivf.  Able to ambulate to BR with RN.  Not syncopal, but c/o weakness and dizzy.      Tanna Furry, MD 10/08/13 [phone]

## 2013-10-08 NOTE — Progress Notes (Signed)
INITIAL NUTRITION ASSESSMENT  DOCUMENTATION CODES Per approved criteria  -Not Applicable   INTERVENTION: -Recommend 2PM and 8PM snacks as diet advancement tolerated -Diet advancement per MD -Will continue to monitor  NUTRITION DIAGNOSIS: Inadequate oral intake related to inability to eat as evidenced by NPO status.   Goal: Pt to meet >/= 90% of their estimated nutrition needs    Monitor:  Diet order, total protein/energy intake, labs, weights, GI profile  Reason for Assessment: MST  73 y.o. female  Admitting Dx: GI bleed  ASSESSMENT: Cynthia Long is a 73 y.o. female with past medical history of HTN, DM 2 and recently diagnosed liver cirrhosis. Patient was in the hospital in the end of June of 2015 for hematemesis and ascites. Endoscopy was done at that time and showed no source of bleeding, patient diagnosed with gastritis and cirrhosis of discharge on antibiotics. This time patient came in because of dark emesis, started last night.  -Pt reported an unintentional wt loss of 20 lbs over past 2-2 months, 9 lb can be attributed to paracentesis that resulted in removal of 4 liters of fluid (11 lbs weight loss equals approximate 7% body weight loss, not significant for time frame) -Endorsed feelings of early satiety since previous admit in 08/2013. Was on liquid diet for several days, which pt attributes to effecting appetite -Pt consumes 2-3 small meals daily, and small snacks in the afternoon and before bed to assist with blood glucose control -Eats balanced meals, and tries to comply with DM2 diet recommendations. Consumes minimal sweets -Currently NPO d/t possible upper GI bleed -Discussed snacks for diet advancement. Pt agreed upon graham crackers w/peanut butter, and Raisin Bran w/skim milk. Will order snacks as tolerated -Pt experienced vomiting and nausea pta. Currently denied symptoms, receiving Reglan and protonix   Height: Ht Readings from Last 1 Encounters:  10/08/13 5'  3" (1.6 m)    Weight: Wt Readings from Last 1 Encounters:  10/08/13 136 lb 6.4 oz (61.871 kg)    Ideal Body Weight: 115 lbs  % Ideal Body Weight: 118%  Wt Readings from Last 10 Encounters:  10/08/13 136 lb 6.4 oz (61.871 kg)  10/08/13 136 lb 6.4 oz (61.871 kg)  09/08/13 161 lb 6.4 oz (73.211 kg)  08/27/13 155 lb (70.308 kg)  08/18/13 155 lb 3.3 oz (70.4 kg)  08/18/13 155 lb 3.3 oz (70.4 kg)    Usual Body Weight: 155 lbs  % Usual Body Weight: 87%  BMI:  Body mass index is 24.17 kg/(m^2).  Estimated Nutritional Needs: Kcal: 1550-1750 Protein: 70-85 gram Fluid: >/=1600 ml/daily  Skin: WDL  Diet Order: NPO  EDUCATION NEEDS: -Education needs addressed  No intake or output data in the 24 hours ending 10/08/13 1536  Last BM: PTA   Labs:   Recent Labs Lab 10/08/13 1105  NA 130*  K 5.0  CL 95*  CO2 21  BUN 31*  CREATININE 0.81  CALCIUM 10.2  GLUCOSE 248*    CBG (last 3)  No results found for this basename: GLUCAP,  in the last 72 hours  Scheduled Meds: . metoCLOPramide (REGLAN) injection  10 mg Intravenous Once  . pantoprazole (PROTONIX) IV  40 mg Intravenous Q12H  . sodium chloride  3 mL Intravenous Q12H    Continuous Infusions: . sodium chloride 50 mL/hr at 10/08/13 1515  . sodium chloride      Past Medical History  Diagnosis Date  . Hypertension   . Dysrhythmia   . Diabetes mellitus without complication   .  Headache(784.0)   . 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 Pertussis 2011  . GI bleed 08/2013, 10/2013    EGD negative 08/2013.     Past Surgical History  Procedure Laterality Date  . Abdominal hysterectomy      partial, for prolapsed uterus.  . Esophagogastroduodenoscopy N/A 08/14/2013    Procedure: ESOPHAGOGASTRODUODENOSCOPY (EGD);  Surgeon: Inda Castle, MD;  Location: [facility];  Service: Endoscopy;  Laterality: N/A;    Atlee Abide MS RD LDN Clinical Dietitian [phone]

## 2013-10-08 NOTE — ED Notes (Signed)
MD at bedside. 

## 2013-10-08 NOTE — H&P (Signed)
[facility] History and Physical  Cynthia Long HYQ:[id number] DOB: 24-Apr-1940 DOA: 10/08/2013  Referring physician: Dr. Tanna Furry PCP: Macon Large, MD   Chief Complaint: Dark emesis  HPI: Cynthia Long is a 73 y.o. female with past medical history of HTN, DM 2 and recently diagnosed liver cirrhosis. Patient was in the hospital in the end of June of 2015 for hematemesis and ascites. Endoscopy was done at that time and showed no source of bleeding, patient diagnosed with gastritis and cirrhosis of discharge on antibiotics. This time patient came in because of dark emesis, started last night. Patient vomited about 5 times since last night all of it dark, did not see any bright red blood. Patient denies any abdominal pain, denies any melena. In the ED labs showed a hemoglobin of 6.9, hemoglobin was 8.2 on 6/17, platelets of 102, her albumin is 3.4.  Review of Systems:  Constitutional: negative for anorexia, fevers and sweats Eyes: negative for irritation, redness and visual disturbance Ears, nose, mouth, throat, and face: negative for earaches, epistaxis, nasal congestion and sore throat Respiratory: negative for cough, dyspnea on exertion, sputum and wheezing Cardiovascular: negative for chest pain, dyspnea, lower extremity edema, orthopnea, palpitations and syncope Gastrointestinal: Nausea and vomiting of dark vomitus Genitourinary:negative for dysuria, frequency and hematuria Hematologic/lymphatic: negative for bleeding, easy bruising and lymphadenopathy Musculoskeletal:negative for arthralgias, muscle weakness and stiff joints Neurological: negative for coordination problems, gait problems, headaches and weakness Endocrine: negative for diabetic symptoms including polydipsia, polyuria and weight loss Allergic/Immunologic: negative for anaphylaxis, hay fever and urticaria  Past Medical History  Diagnosis Date  . Hypertension   . Dysrhythmia   . Diabetes mellitus without complication    . Headache(784.0)   . Cirrhosis    Past Surgical History  Procedure Laterality Date  . Abdominal hysterectomy    . Esophagogastroduodenoscopy N/A 08/14/2013    Procedure: ESOPHAGOGASTRODUODENOSCOPY (EGD);  Surgeon: Inda Castle, MD;  Location: [facility];  Service: Endoscopy;  Laterality: N/A;   Social History:   reports that she quit smoking about 52 years ago. Her smoking use included Cigarettes. She smoked 0.00 packs per day. She has never used smokeless tobacco. She reports that she does not drink alcohol or use illicit drugs.  Allergies  Allergen Reactions  . Biaxin [Clarithromycin]     Liver enzymes elevated per pt  . Sulfa Antibiotics Diarrhea    History reviewed. No pertinent family history.   Prior to Admission medications   Medication Sig Start Date End Date Taking? Authorizing Provider  aspirin EC 81 MG tablet Take 81 mg by mouth daily.   Yes Historical Provider, MD  budesonide (PULMICORT) 0.5 MG/2ML nebulizer solution Take 0.5 mg by nebulization daily as needed (for shortness of breath).   Yes Historical Provider, MD  calcium-vitamin D (OSCAL WITH D) 250-125 MG-UNIT per tablet Take 6 tablets by mouth daily.   Yes Historical Provider, MD  cromolyn (INTAL) 20 MG/2ML nebulizer solution Take 20 mg by nebulization daily as needed for wheezing.   Yes Historical Provider, MD  diltiazem (DILACOR XR) 120 MG 24 hr capsule Take 120 mg by mouth daily.   Yes Historical Provider, MD  glimepiride (AMARYL) 4 MG tablet Take 4 mg by mouth daily with breakfast.    Yes Historical Provider, MD  PRESCRIPTION MEDICATION every Wednesday. Allergy Shots   Yes Historical Provider, MD  Saxagliptin-Metformin (KOMBIGLYZE XR) 07-998 MG TB24 Take 1 tablet by mouth daily.   Yes Historical Provider, MD  spironolactone (ALDACTONE) 100 MG tablet Take  1 tablet (100 mg total) by mouth daily. 09/08/13  Yes Jessica D. Zehr, PA-C  Na Sulfate-K Sulfate-Mg Sulf (SUPREP BOWEL PREP) SOLN USE PER PREP INSTRUCTIONS  08/27/13   Laban Emperor. Zehr, PA-C   Physical Exam: Filed Vitals:   10/08/13 1303  BP:   Pulse:   Temp: 98.1 F (36.7 C)  Resp:    Constitutional: Oriented to person, place, and time. Well-developed and well-nourished. Cooperative.  Head: Normocephalic and atraumatic.  Nose: Nose normal.  Mouth/Throat: Uvula is midline, oropharynx is clear and moist and mucous membranes are normal.  Eyes: Conjunctivae and EOM are normal. Pupils are equal, round, and reactive to light.  Neck: Trachea normal and normal range of motion. Neck supple.  Cardiovascular: Normal rate, regular rhythm, S1 normal, S2 normal, normal heart sounds and intact distal pulses.   Pulmonary/Chest: Effort normal and breath sounds normal.  Abdominal: Abdomen is distended but soft, bowel sounds heard, I can't appreciate any hepatosplenomegaly.  Musculoskeletal: Normal range of motion.  Neurological: Alert and oriented to person, place, and time. Has normal strength. No cranial nerve deficit or sensory deficit.  Skin: Skin is warm, dry and intact.  Psychiatric: Has a normal mood and affect. Speech is normal and behavior is normal.   Labs on Admission:  Basic Metabolic Panel:  Recent Labs Lab 10/08/13 1105  NA 130*  K 5.0  CL 95*  CO2 21  GLUCOSE 248*  BUN 31*  CREATININE 0.81  CALCIUM 10.2   Liver Function Tests:  Recent Labs Lab 10/08/13 1221  AST 30  ALT 25  ALKPHOS 105  BILITOT 1.0  PROT 6.6  ALBUMIN 3.4*    Recent Labs Lab 10/08/13 1105  LIPASE 49    Recent Labs Lab 10/08/13 1132  AMMONIA 26   CBC:  Recent Labs Lab 10/08/13 1105  WBC 5.0  NEUTROABS 4.0  HGB 6.9*  HCT 22.3*  MCV 80.2  PLT 102*   Cardiac Enzymes: No results found for this basename: CKTOTAL, CKMB, CKMBINDEX, TROPONINI,  in the last 168 hours  BNP (last 3 results) No results found for this basename: PROBNP,  in the last 8760 hours CBG: No results found for this basename: GLUCAP,  in the last 168  hours  Radiological Exams on Admission: No results found.  EKG: Independently reviewed.   Assessment/Plan Principal Problem:   GI bleed Active Problems:   Acute posthemorrhagic anemia   Nausea with vomiting   Type II or unspecified type diabetes mellitus without mention of complication, not stated as uncontrolled   Ascites    GI bleed -Patient presented with dark emesis, probably upper GI bleed. -Presented with the same last time, EGD did not show any evidence of active bleeding or probable source. -Gastroenterology consulted. -Keep patient n.p.o., started on Protonix 40 mg every 12 hours.  Acute blood loss anemia -Patient hemoglobin is 6.9, I will transfuse 2 units of packed RBCs. -Patient has history of cirrhosis and ascites I will give 20 units of Lasix between transfusion. -Check hemoglobin every 8 hours, transfuse to keep hemoglobin above 8.0.  Type 2 diabetes mellitus -Hemoglobin A1c was 6.9 on 6/12 indicates good glycemic control. -Patient will be n.p.o., when diet started should be on insulin sliding scale.  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 -Compensated with slight thrombocytopenia, slight hypoalbuminemia and hyponatremia. -No evidence of hyperammonemia or hyperbilirubinemia, INR is 1.29.  Code Status: Full code Family Communication: I discussed with the daughter [name] at bedside Disposition Plan: Telemetry, inpatient.  Time spent: 70 minutes  [facility] Pager [phone]

## 2013-10-08 NOTE — Consult Note (Signed)
Patient seen, examined, and I agree with the above documentation, including the assessment and plan. Pt with NASH cirrhosis now with coffee-ground emesis and worsened anemia No abd pain, just vomiting that started last night Agree with EGD this afternoon The nature of the procedure, as well as the risks, benefits, and alternatives were carefully and thoroughly reviewed with the patient. Ample time for discussion and questions allowed. The patient understood, was satisfied, and agreed to proceed.

## 2013-10-08 NOTE — ED Notes (Signed)
Pt with dizziness, nausea and vomiting since 9 pm last night.  Denies pain or diarrhea.  No fever.  Hx Cirrhosis of the liver.

## 2013-10-08 NOTE — Progress Notes (Signed)
Called for report, had to leave message for nurse to call me back.  Will follow up in 15 mins.

## 2013-10-08 NOTE — Progress Notes (Signed)
UR completed 

## 2013-10-08 NOTE — Telephone Encounter (Signed)
Agree 

## 2013-10-08 NOTE — Consult Note (Signed)
[facility] Consult: 2:49 PM 10/08/2013  LOS: 0 days    Referring Provider: Dr Hartford Poli  Primary Care Physician:  Macon Large, MD Primary Gastroenterologist:  [name]   Reason for Consultation:  CG emesis    HPI: Cynthia Long is a 73 y.o. female.  Hx DM2, htn. 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  EGD by Dr Deatra Ina 08/14/13 for black diarrhea, n/v, anemia (hgb 7.5) was negative. Question raised of bleeding from colon from enteric infection.  No colonoscopy done as bleeding resolved, anemia stabilized.  Received one unit PRBCs during admission. Had abnormal LFTs, ultrasound showed cholelithiasis, ascites, cirrhosis, splenomegaly.  She had thrombocytopenia. Hepatitis and autoimmune serologies negative.   Followed up with GI [facility] 7/8.  LFTs were normalized. She c/o increased abd girth and had 10# weight gain.  Lasix increased to 40 mg daily, started on 100 mg Aldactone.  Underwent 4 liter paracentesis on 09/16/13.  Fluid studies negative for SBP.  Has colonoscopy set for 8/18.  Labs from [place] on 7/31 with no adverse renal function, Hgb 9.1 (up). Pulmonologist later stopped the Lasix, her ankles and belly had stopped swelling, weight stable at 140#  Potassium "good" on [place] labs of 8/5. Was doing well and thinking of starting back to after Labor day.  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  Started vomiting last night, all night long.  Black emesis from the start.  Stool this AM is brown.  No abdominal pain. Towards last episodes of emesis, she was mostly dry heaving.  About 5 episodes of the dark emesis in total.   Nausea resolved after Zofran and 40 mg IV Protonix in ED.  In ED Hgb is 6.9. BUN 31, normal  creatinine.  PT/INR: 16.1/1.2  No NSAIDs, no ETOH.  Appetite good, weight stable.  No fainting or dizziness.     Past Medical History  Diagnosis Date  . Hypertension   . Dysrhythmia   . Diabetes mellitus without complication   . Headache(784.0)   . 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 Pertussis 2011  . GI bleed 08/2013, 10/2013    EGD negative 08/2013.     Past Surgical History  Procedure Laterality Date  . Abdominal hysterectomy    . Esophagogastroduodenoscopy N/A 08/14/2013    Procedure: ESOPHAGOGASTRODUODENOSCOPY (EGD);  Surgeon: Inda Castle, MD;  Location: [facility];  Service: Endoscopy;  Laterality: N/A;    Prior to Admission medications   Medication Sig Start Date End Date Taking? Authorizing Provider  aspirin EC 81 MG tablet Take 81 mg by mouth daily.   Yes Historical Provider, MD  budesonide (PULMICORT) 0.5 MG/2ML nebulizer solution Take 0.5 mg by nebulization daily as needed (for shortness of breath).   Yes Historical Provider, MD  calcium-vitamin D (OSCAL WITH D) 250-125 MG-UNIT per tablet Take 6 tablets by mouth daily.   Yes Historical Provider, MD  cromolyn (INTAL) 20 MG/2ML nebulizer solution Take 20 mg by nebulization daily  as needed for wheezing.   Yes Historical Provider, MD  diltiazem (DILACOR XR) 120 MG 24 hr capsule Take 120 mg by mouth daily.   Yes Historical Provider, MD  glimepiride (AMARYL) 4 MG tablet Take 4 mg by mouth daily with breakfast.    Yes Historical Provider, MD  PRESCRIPTION MEDICATION every Wednesday. Allergy Shots   Yes Historical Provider, MD  Saxagliptin-Metformin (KOMBIGLYZE XR) 07-998 MG TB24 Take 1 tablet by mouth daily.   Yes Historical Provider, MD  spironolactone (ALDACTONE) 100 MG tablet Take 1 tablet (100 mg total) by mouth daily. 09/08/13  Yes Jessica D. Zehr, PA-C  Na Sulfate-K Sulfate-Mg Sulf (SUPREP BOWEL PREP) SOLN USE PER PREP INSTRUCTIONS 08/27/13   Laban Emperor. Zehr, PA-C    Scheduled Meds:  Infusions:  PRN  Meds: morphine injection, oxyCODONE   Allergies as of 10/08/2013 - Review Complete 10/08/2013  Allergen Reaction Noted  . Biaxin [clarithromycin]  08/14/2013  . Sulfa antibiotics Diarrhea 08/14/2013    History reviewed. No pertinent family history.  History   Social History  . Marital Status: Single    Spouse Name: N/A    Number of Children: N/A  . Years of Education: N/A   Occupational History  . Not on file.   Social History Main Topics  . Smoking status: Former Smoker    Types: Cigarettes    Quit date: 10/08/1961  . Smokeless tobacco: Never Used  . Alcohol Use: No  . Drug Use: No  . Sexual Activity: Not on file   Other Topics Concern  . Not on file   Social History Narrative  . No narrative on file    REVIEW OF SYSTEMS: Constitutional:  Generally strength is good. ENT:  No nose bleeds Pulm:  Stable DOE, SOB with climbing stairs.  Non-productive cough CV:  No palpitations, no LE edema.  GU:  No hematuria, no frequency GI:  Per HPI Heme:  Per HPI   Transfusions:  Per HPI Neuro:  No headaches, no peripheral tingling or numbness Derm:  No itching, no rash or sores.  Endocrine:  No sweats or chills.  No polyuria or dysuria Immunization:  Not queried Travel:  None beyond local counties in last few months.    PHYSICAL EXAM: Vital signs in last 24 hours: Filed Vitals:   10/08/13 1400  BP: 145/44  Pulse: 101  Temp:   Resp: 16   Wt Readings from Last 3 Encounters:  09/08/13 73.211 kg (161 lb 6.4 oz)  08/27/13 70.308 kg (155 lb)  08/18/13 70.4 kg (155 lb 3.3 oz)    General: somewhat pale but well appearing WF.   Head:  No swelling,   Eyes:  No pallor, sclera look icteric.   Ears:  Not HOH  Nose:  No congestion or discharge Mouth:  Clear and moist mucosa. Neck:  No JVD, no masses Lungs:  Clear bil.  No cough Heart: RR, slightly tachy, no MRG Abdomen:  Soft, NT, ND.  No mass.  No HSM.  No bruits.  BS active.   Rectal: deferred   Musc/Skeltl: no  joint contracture or swelling Extremities:  No CCE.   Neurologic:  Oriented x 3. No limb weakness, no tremor Skin:  No telangectasia, no sores, no rash Tattoos:  none Nodes:  No cervical adenopathy   Psych:  Pleasant, relaxed.  Fluid speech.   Intake/Output from previous day:   Intake/Output this shift:    LAB RESULTS:  Recent Labs  10/08/13 1105  WBC 5.0  HGB  6.9*  HCT 22.3*  PLT 102*   BMET Lab Results  Component Value Date   NA 130* 10/08/2013   NA 137 09/08/2013   NA 137 08/27/2013   K 5.0 10/08/2013   K 3.3* 09/08/2013   K 4.6 08/27/2013   CL 95* 10/08/2013   CL 101 09/08/2013   CL 101 08/27/2013   CO2 21 10/08/2013   CO2 25 09/08/2013   CO2 31 08/27/2013   GLUCOSE 248* 10/08/2013   GLUCOSE 126* 09/08/2013   GLUCOSE 155* 08/27/2013   BUN 31* 10/08/2013   BUN 11 09/08/2013   BUN 12 08/27/2013   CREATININE 0.81 10/08/2013   CREATININE 0.9 09/08/2013   CREATININE 1.0 08/27/2013   CALCIUM 10.2 10/08/2013   CALCIUM 8.9 09/08/2013   CALCIUM 9.0 08/27/2013   LFT  Recent Labs  10/08/13 1221  PROT 6.6  ALBUMIN 3.4*  AST 30  ALT 25  ALKPHOS 105  BILITOT 1.0  BILIDIR 0.3  IBILI 0.7   PT/INR Lab Results  Component Value Date   INR 1.29 10/08/2013   INR 1.40 08/13/2013   Hepatitis Panel No results found for this basename: HEPBSAG, HCVAB, HEPAIGM, HEPBIGM,  in the last 72 hours C-Diff No components found with this basename: cdiff   Lipase     Component Value Date/Time   LIPASE 49 10/08/2013 1105     RADIOLOGY STUDIES: No results found.  ENDOSCOPIC STUDIES: EGD per HPI in 08/2013.   IMPRESSION:   *  Recurrent upper GI bleed.  Same in  08/2013 with normal, unrevealing EGD.  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  Recurrent.  Transfused one unit PRBC in 08/2013  *  Thrombocytopenia. Improved from 08/2013 nadir of 87 and 89.   *   Ascites.  4 liter tap 09/2013.  On aldactone 100 mg daily.  No subjective reaccumulation of fluid.  Studies negative for SBP.     PLAN:      *  EGD this afternoon. *  Follow blood counts.  *  Agree with PPI IV BID.  After EGD can decide if she needs Octreotide.    Azucena Freed  10/08/2013, 2:49 PM Pager: [phone]

## 2013-10-08 NOTE — Telephone Encounter (Signed)
Spoke with patient's daughter and patient has been vomiting all last night. She found her mother still in bed. She reports vomiting 4-5 times last night since 10:00 PM. Emesis is black in color. She has not had blood in stool. Per Alonza Bogus, PA needs to go to ED for evaluation. Spoke with patient's daughter and she will take her to [facility] ED since that is where they live.

## 2013-10-08 NOTE — Op Note (Signed)
[facility] [place], [address]   ENDOSCOPY PROCEDURE REPORT  PATIENT: Cynthia Long, Cynthia Long  MR#: [id number] BIRTHDATE: March 12, 1940 , 56  yrs. old GENDER: Female ENDOSCOPIST: [name], MD REFERRED BY:  [facility] PROCEDURE DATE:  10/08/2013 PROCEDURE:  EGD, diagnostic ASA CLASS:     Class III INDICATIONS:  Hematemesis.   History of cirrhosis. MEDICATIONS: These medications were titrated to patient response per physician's verbal order, Fentanyl 50 mcg IV, and Versed 5 mg IV TOPICAL ANESTHETIC: Cetacaine Spray  DESCRIPTION OF PROCEDURE: After the risks benefits and alternatives of the procedure were thoroughly explained, informed consent was obtained.  The Pentax Gastroscope [id number] endoscope was introduced through the mouth and advanced to the second portion of the duodenum. Without limitations.  The instrument was slowly withdrawn as the mucosa was fully examined.  ESOPHAGUS: The mucosa of the esophagus appeared normal.  No esophageal varices seen. A nonobstructing Schatzki's ring was found at the gastroesophageal junction and was widely open.  STOMACH: No fresh blood was seen in the stomach.  There was a very scant amount of dark material in the proximal stomach.  The stomach was lavaged and insufflated. 3 very small, 1-2 mm, red spots were seen in the proximal gastric body with no evidence of bleeding or oozing. The spots are unlikely to be the source of coffee ground emesis, and are not consistent with angiodysplastic lesions.  The stomach otherwise appeared normal.  DUODENUM: The duodenal mucosa showed no abnormalities in the bulb and second portion of the duodenum.  Retroflexed views revealed no abnormalities and no gastric varices.  The scope was then withdrawn from the patient and the procedure completed.  COMPLICATIONS: There were no complications.  ENDOSCOPIC IMPRESSION: 1.   The mucosa of the esophagus appeared normal 2.    Nonobstructing Schatzki's ring was found at the gastroesophageal junction 3.   3 small spot of erythema without active bleeding in proximal stomach. The stomach otherwise appeared normal 4.   The duodenal mucosa showed no abnormalities in the bulb and second portion of the duodenum  RECOMMENDATIONS: 1.  Observation in the hospital overnight 2.  Clear liquid diet, advance as tolerated 3.  Monitor for recurrent bleeding, monitor hemoglobin  eSigned:  [name], MD 10/08/2013 5:22 PM   CC:The Patient and Erskine Emery, MD  PATIENT NAME:  [name], [name] MR#: [id number]

## 2013-10-09 ENCOUNTER — Inpatient Hospital Stay (HOSPITAL_COMMUNITY): Payer: PRIVATE HEALTH INSURANCE

## 2013-10-09 ENCOUNTER — Encounter (HOSPITAL_COMMUNITY): Payer: Self-pay | Admitting: Radiology

## 2013-10-09 LAB — CBC
HCT: 27.2 % — ABNORMAL LOW (ref 36.0–46.0)
Hemoglobin: 9 g/dL — ABNORMAL LOW (ref 12.0–15.0)
MCH: 26.9 pg (ref 26.0–34.0)
MCHC: 33.1 g/dL (ref 30.0–36.0)
MCV: 81.4 fL (ref 78.0–100.0)
PLATELETS: 97 10*3/uL — AB (ref 150–400)
RBC: 3.34 MIL/uL — AB (ref 3.87–5.11)
RDW: 15.9 % — ABNORMAL HIGH (ref 11.5–15.5)
WBC: 5.5 10*3/uL (ref 4.0–10.5)

## 2013-10-09 LAB — COMPREHENSIVE METABOLIC PANEL
ALK PHOS: 89 U/L (ref 39–117)
ALT: 24 U/L (ref 0–35)
AST: 34 U/L (ref 0–37)
Albumin: 3.1 g/dL — ABNORMAL LOW (ref 3.5–5.2)
Anion gap: 17 — ABNORMAL HIGH (ref 5–15)
BUN: 21 mg/dL (ref 6–23)
CO2: 19 meq/L (ref 19–32)
Calcium: 9.3 mg/dL (ref 8.4–10.5)
Chloride: 103 mEq/L (ref 96–112)
Creatinine, Ser: 1.03 mg/dL (ref 0.50–1.10)
GFR calc Af Amer: 62 mL/min — ABNORMAL LOW (ref >90)
GFR, EST NON AFRICAN AMERICAN: 54 mL/min — AB (ref >90)
Glucose, Bld: 102 mg/dL — ABNORMAL HIGH (ref 70–99)
POTASSIUM: 4.2 meq/L (ref 3.7–5.3)
SODIUM: 139 meq/L (ref 137–147)
Total Bilirubin: 1.9 mg/dL — ABNORMAL HIGH (ref 0.3–1.2)
Total Protein: 5.8 g/dL — ABNORMAL LOW (ref 6.0–8.3)

## 2013-10-09 LAB — GLUCOSE, CAPILLARY
Glucose-Capillary: 111 mg/dL — ABNORMAL HIGH (ref 70–99)
Glucose-Capillary: 106 mg/dL — ABNORMAL HIGH (ref 70–99)
Glucose-Capillary: 152 mg/dL — ABNORMAL HIGH (ref 70–99)
Glucose-Capillary: 144 mg/dL — ABNORMAL HIGH (ref 70–99)
Glucose-Capillary: 139 mg/dL — ABNORMAL HIGH (ref 70–99)

## 2013-10-09 MED ORDER — INSULIN ASPART 100 UNIT/ML ~~LOC~~ SOLN
0.0000 [IU] | SUBCUTANEOUS | Status: DC
  Administered 2013-10-09 (×2): 1 [IU] via SUBCUTANEOUS
  Administered 2013-10-09: 2 [IU] via SUBCUTANEOUS
  Administered 2013-10-10: 1 [IU] via SUBCUTANEOUS
  Administered 2013-10-10: 5 [IU] via SUBCUTANEOUS
  Administered 2013-10-10: 1 [IU] via SUBCUTANEOUS
  Administered 2013-10-11: 2 [IU] via SUBCUTANEOUS

## 2013-10-09 MED ORDER — IOHEXOL 300 MG/ML  SOLN
100.0000 mL | Freq: Once | INTRAMUSCULAR | Status: AC | PRN
  Administered 2013-10-09: 100 mL via INTRAVENOUS

## 2013-10-09 MED ORDER — IOHEXOL 300 MG/ML  SOLN
50.0000 mL | Freq: Once | INTRAMUSCULAR | Status: AC | PRN
  Administered 2013-10-09: 50 mL via ORAL

## 2013-10-09 NOTE — Progress Notes (Signed)
Patient ID: Cynthia Long, female   DOB: 03-04-1941, 73 y.o.   MRN: [id number]  [facility] Progress Note  Subjective: Feels fine- no further emesis or nausea. No stools Hgb up to 9.0 after transfusion x 2, plts 97  Objective:  Vital signs in last 24 hours: Temp:  [97.2 F (36.2 C)-98.8 F (37.1 C)] 98.2 F (36.8 C) (08/08 0618) Pulse Rate:  [69-106] 69 (08/08 0618) Resp:  [14-23] 18 (08/08 0618) BP: (105-157)/(33-58) 113/45 mmHg (08/08 0618) SpO2:  [94 %-100 %] 99 % (08/08 0618) Weight:  [136 lb 6.4 oz (61.871 kg)] 136 lb 6.4 oz (61.871 kg) (08/07 1459) Last BM Date: 10/08/13 General:   Alert,  Well-developed,  WF  in NAD Heart:  Regular rate and rhythm; no murmurs Pulm;clear Abdomen:  Soft, nontender, nontense ascites,Normal bowel sounds, without guarding, and without rebound.   Extremities:  Without edema. Neurologic:  Alert and  oriented x4;  grossly normal neurologically. Psych:  Alert and cooperative. Normal mood and affect.  Intake/Output from previous day: 08/07 0701 - 08/08 0700 In: 1769.2 [P.O.:240; I.V.:737.5; Blood:791.7] Out: 2000 [Urine:2000] Intake/Output this shift:    Lab Results:  Recent Labs  10/08/13 1105 10/08/13 2105 10/09/13 0827  WBC 5.0 5.8 5.5  HGB 6.9* 5.6* 9.0*  HCT 22.3* 18.0* 27.2*  PLT 102* 96* 97*   BMET  Recent Labs  10/08/13 1105  NA 130*  K 5.0  CL 95*  CO2 21  GLUCOSE 248*  BUN 31*  CREATININE 0.81  CALCIUM 10.2   LFT  Recent Labs  10/08/13 1221  PROT 6.6  ALBUMIN 3.4*  AST 30  ALT 25  ALKPHOS 105  BILITOT 1.0  BILIDIR 0.3  IBILI 0.7   PT/INR  Recent Labs  10/08/13 1105  LABPROT 16.1*  INR 1.29    Assessment / Plan: #1  72 yo female with 2 recent admits with coffee ground emesis, and drop in hgb - No significant findings on EGD. ? Small bowel varices or other proximal small bowel source ?  Advance diet to full liquids today Schedule for CT abd/pelvis  With new dx of cirrhosis and Gi  bleeding of unclear etiology-depending on Ct results , may need Capsule or enteroscopy #2 anemia- severe- stable post transfusions #3 AODM   Principal Problem:   GI bleed Active Problems:   Acute posthemorrhagic anemia   Nausea with vomiting   Type II or unspecified type diabetes mellitus without mention of complication, not stated as uncontrolled   Ascites   Coffee ground emesis     LOS: 1 day   Javed Cotto  10/09/2013, 10:09 AM

## 2013-10-09 NOTE — Progress Notes (Signed)
Adult Hypoglycemia Protocol Treatment Guidelines  1. RN shall initiate Hypoglycemia Protocol emergency measures immediately when:            w        Routine or STAT CBG and/or a lab glucose indicates hypoglycemia (CBG < 70 mg/dl)  2. Treat the patient according to ability to take PO's and severity of hypoglycemia.   3. If patient is on GlucoStabilizer, follow directions provided by the [facility] for hypoglycemic events.  4. If patient on insulin pump, follow Hypoglycemia Protocol.  If patient requires more than one treatment have patient place pump in SUSPEND and notify MD.  DO NOT leave pump in SUSPEND for greater than 30 minutes unless ordered by MD.  A. Treatment for Mild or Moderate-Patient cooperative and able to swallow    1.  Patient taking PO's and can cooperate   a.  Give one of the following 15 gram CHO options:                           w     1 tube oral dextrose gel                           w     3-4 Glucose tablets                           w     4 oz. Juice                           w     4 oz. regular soda                                    ESRD patients:  clear, regular soda                           w     8 oz. skim milk    b.  Recheck CBG in 15 minutes after treatment                            w       If CBG < 70 mg/dl, repeat treatment and recheck until hypoglycemia is resolved                            w       If CBG > 70 mg/dl and next meal is more than 1 hour away, give additional 15 grams CHO   2.  Patient NPO-Patient cooperative and no altered mental status    a.  Give 25 ml of D50 IV.   b.  Recheck CBG in 15 minutes after treatment.                             w         If CBG is less than 70 mg/dl, repeat treatment and recheck until hypoglycemia is resolved.   c.  Notify MD for further orders.             SPECIAL CONSIDERATIONS:    a.  If no IV access,                              w        Start IV of D5W at [facility]                             w         Give 25 ml of D50 IV.    b.  If unable to gain IV access                             w          Give Glucagon IM:     i.  1 mg if patient weighs more than 45.5 kg     ii.  0.5 mg if patient weighs less than 45.5 kg   c.  Notify MD for further orders  B. Treatment for Severe-- Patient unconscious or unable to take PO's safely    1.  Position patient on side   2.  Give 50 ml D50 IV   3.  Recheck CBG in 15 minutes.                    w      If CBG is less than 70 mg/dl, repeat treatment and recheck until hypoglycemia is resolved.   4.  Notify MD for further orders.    SPECIAL CONSIDERATIONS:    a.  If no IV access                              w     Give Glucagon IM                                              i.  1 mg if patient weighs more than 45.5 kg                                             ii.  0.5 mg if patient weighs less than 45.5 kg                              w      Start IV of D5W at 50 ml/hr and give 50 ml D50 IV   b.  If no IV access and active seizure                               w       Call Rapid Response   c.  If unable to gain IV access, give Glucagon IM:                              w          1 mg if patient weighs more than 45.5 kg  w          0.5 mg if patient weighs less than 45.5 kg   d.  Notify MD for further orders.  C. Complete smart text progress note to document intervention and follow-up CBG   1. In [facility] patient chart, click on Notes (left side of screen)   2. Create Progress Note   3. Click on [employer].  In the Match box type "hypo" and enter    4. Double click on CHL IP HYPOGLYCEMIC EVENT and enter data   5. MD must be notified if patient is NPO or experienced severe hypoglycemia

## 2013-10-09 NOTE — Progress Notes (Signed)
Hypoglycemic Event  CBG: 68  Treatment: 15 GM carbohydrate snack  Symptoms: None  Follow-up CBG: Time:00:02 on 10/10/13 CBG Result:84  Possible Reasons for Event: Inadequate meal intake on Full liquids & medication regimen-coverage q4hrs-Novolog  Comments/MD notified:No, pt was asymptomatic & tx successful    Cynthia Long  Remember to initiate Hypoglycemia Order Set & complete

## 2013-10-09 NOTE — Progress Notes (Signed)
[facility] PROGRESS NOTE  Cynthia Long PPJ:[id number] DOB: 20-Nov-1940 DOA: 10/08/2013 PCP: Macon Large, MD  Assessment/Plan: Principal Problem:   GI bleed - Currently unclear source. Pt is s/p EGD with no significant findings per GI's last note. - Bleeding has ceased. GI on board and currently plan is for CT scan for further evaluation. If no source identified and plan will be for capsule endoscopy which can be done as outpatient.  Active Problems:   Acute posthemorrhagic anemia - Cessation of bleeding. -Will monitor for another 24 hours. Hemoglobin steady at 9.0. -Patient is status post 2 units of packed red blood cells    Nausea with vomiting - resolved    Type II or unspecified type diabetes mellitus without mention of complication, not stated as uncontrolled - Once tolerating full liquid diet we'll plan on transitioning to diabetic diet - At this juncture continue sliding scale insulin    Ascites - chronic management per GI   Code Status: full Family Communication: discussed with patient and family at bedside. Disposition Plan: With improvement in oral intake, cessation of GI bleed, and steady hemoglobin levels   Consultants:  GI  Procedures:  CT of abdomen and pelvis  EGD  Antibiotics:  None  HPI/Subjective: Pt has no new complaints. Inquiring about discharge planning  Objective: Filed Vitals:   10/09/13 1437  BP: 129/54  Pulse: 88  Temp: 98.1 F (36.7 C)  Resp: 18    Intake/Output Summary (Last 24 hours) at 10/09/13 1534 Last data filed at 10/09/13 1444  Gross per 24 hour  Intake 2685.83 ml  Output   3300 ml  Net -614.17 ml   Filed Weights   10/08/13 1459  Weight: 61.871 kg (136 lb 6.4 oz)    Exam:   General:  Pt in nad, alert and awake  Cardiovascular: rrr, no mrg  Respiratory: cta bl, no wheezes  Abdomen: soft, ND  Musculoskeletal: no cyanosis or clubbing   Data Reviewed: Basic Metabolic Panel:  Recent Labs Lab  10/08/13 1105 10/09/13 0827  NA 130* 139  K 5.0 4.2  CL 95* 103  CO2 21 19  GLUCOSE 248* 102*  BUN 31* 21  CREATININE 0.81 1.03  CALCIUM 10.2 9.3   Liver Function Tests:  Recent Labs Lab 10/08/13 1221 10/09/13 0827  AST 30 34  ALT 25 24  ALKPHOS 105 89  BILITOT 1.0 1.9*  PROT 6.6 5.8*  ALBUMIN 3.4* 3.1*    Recent Labs Lab 10/08/13 1105  LIPASE 49    Recent Labs Lab 10/08/13 1132  AMMONIA 26   CBC:  Recent Labs Lab 10/08/13 1105 10/08/13 2105 10/09/13 0827  WBC 5.0 5.8 5.5  NEUTROABS 4.0  --   --   HGB 6.9* 5.6* 9.0*  HCT 22.3* 18.0* 27.2*  MCV 80.2 79.6 81.4  PLT 102* 96* 97*   Cardiac Enzymes: No results found for this basename: CKTOTAL, CKMB, CKMBINDEX, TROPONINI,  in the last 168 hours BNP (last 3 results) No results found for this basename: PROBNP,  in the last 8760 hours CBG:  Recent Labs Lab 10/09/13 0549 10/09/13 0717 10/09/13 1139  GLUCAP 111* 106* 152*    No results found for this or any previous visit (from the past 240 hour(s)).   Studies: Ct Abdomen Pelvis W Contrast  10/09/2013   CLINICAL DATA:  NASH cirrhosis, coffee ground emesis, GI bleed  EXAM: CT ABDOMEN AND PELVIS WITH CONTRAST  TECHNIQUE: Multidetector CT imaging of the abdomen and pelvis was performed using the  standard protocol following bolus administration of intravenous contrast.  CONTRAST:  163mL OMNIPAQUE IOHEXOL 300 MG/ML  SOLN  COMPARISON:  Abdominal ultrasound dated 08/31/2013  FINDINGS: Trace bilateral pleural effusions with mild left basilar atelectasis.  Cirrhotic configuration of the liver. No suspicious/enhancing hepatic lesions.  Splenomegaly, measuring 15.8 cm in maximal craniocaudal dimension.  Small gastroesophageal and perigastric varices (series 2/images 13 and 17). Portal vein is patent.  Moderate abdominopelvic ascites.  Small upper abdominal lymph nodes, likely reactive.  Gallbladder is notable for a small layering gallstone (series 2/ image 32), without  associated inflammatory changes.  Pancreas and adrenal glands are within normal limits.  Kidneys are within normal limits.  No hydronephrosis.  No evidence of bowel obstruction. Normal appendix. Scattered areas of mild colonic wall thickening are favored to reflect mucosal edema related to ascites.  Atherosclerotic calcifications of the abdominal aorta and branch vessels.  Status post hysterectomy.  Bilateral ovaries are unremarkable.  Bladder is within normal limits.  Degenerative changes of the visualized thoracolumbar spine. Mild superior endplate changes at L2, age indeterminate, with mild retropulsion (sagittal image 90).  IMPRESSION: Cirrhosis.  Stigma of portal hypertension including splenomegaly and a small gastroesophageal and perigastric varices. Portal vein is patent.  Moderate abdominopelvic ascites.  Cholelithiasis.  Additional ancillary findings as above.   Electronically Signed   By: Julian Hy M.D.   On: 10/09/2013 15:04    Scheduled Meds: . antiseptic oral rinse  7 mL Mouth Rinse BID  . chlorhexidine  15 mL Mouth Rinse BID  . insulin aspart  0-9 Units Subcutaneous 6 times per day  . pantoprazole (PROTONIX) IV  40 mg Intravenous Q12H  . sodium chloride  3 mL Intravenous Q12H   Continuous Infusions: . sodium chloride 50 mL/hr at 10/09/13 1235  . sodium chloride      Time spent: > 35 minutes    Velvet Bathe  [facility] Pager [phone] If 7PM-7AM, please contact night-coverage at [email or web address], password [facility] 10/09/2013, 3:34 PM  LOS: 1 day

## 2013-10-09 NOTE — Progress Notes (Signed)
Patient seen, examined, and I agree with the above documentation, including the assessment and plan. Patient feels better today, no further vomiting. Advance diet Agree with cross-sectional imaging. Unclear source of bleeding, agree that video capsule endoscopy is reasonable, this can be done as an outpatient Will need office followup after discharge with [name]

## 2013-10-09 NOTE — Progress Notes (Signed)
CRITICAL VALUE ALERT  Critical value received:  Hemoglobin 5.6  Date of notification:  10/08/13  Time of notification:  2130  Critical value read back:Yes.    Nurse who received alert:  Sherryl Manges RN  MD notified (1st page):  Kathline Magic NP  Time of first page:  2130  MD notified (2nd page):  Time of second page:  Responding MD:  Kathline Magic   Time MD responded:  2130

## 2013-10-09 NOTE — Progress Notes (Deleted)
Adult Hypoglycemia Protocol Treatment Guidelines  1. RN shall initiate Hypoglycemia Protocol emergency measures immediately when:            w        Routine or STAT CBG and/or a lab glucose indicates hypoglycemia (CBG < 70 mg/dl)  2. Treat the patient according to ability to take PO's and severity of hypoglycemia.   3. If patient is on GlucoStabilizer, follow directions provided by the [facility] for hypoglycemic events.  4. If patient on insulin pump, follow Hypoglycemia Protocol.  If patient requires more than one treatment have patient place pump in SUSPEND and notify MD.  DO NOT leave pump in SUSPEND for greater than 30 minutes unless ordered by MD.  A. Treatment for Mild or Moderate-Patient cooperative and able to swallow    1.  Patient taking PO's and can cooperate   a.  Give one of the following 15 gram CHO options:                           w     1 tube oral dextrose gel                           w     3-4 Glucose tablets                           w     4 oz. Juice                           w     4 oz. regular soda                                    ESRD patients:  clear, regular soda                           w     8 oz. skim milk    b.  Recheck CBG in 15 minutes after treatment                            w       If CBG < 70 mg/dl, repeat treatment and recheck until hypoglycemia is resolved                            w       If CBG > 70 mg/dl and next meal is more than 1 hour away, give additional 15 grams CHO   2.  Patient NPO-Patient cooperative and no altered mental status    a.  Give 25 ml of D50 IV.   b.  Recheck CBG in 15 minutes after treatment.                             w         If CBG is less than 70 mg/dl, repeat treatment and recheck until hypoglycemia is resolved.   c.  Notify MD for further orders.             SPECIAL CONSIDERATIONS:    a.  If no IV access,                              w        Start IV of D5W at [facility]                             w         Give 25 ml of D50 IV.    b.  If unable to gain IV access                             w          Give Glucagon IM:     i.  1 mg if patient weighs more than 45.5 kg     ii.  0.5 mg if patient weighs less than 45.5 kg   c.  Notify MD for further orders  B. Treatment for Severe-- Patient unconscious or unable to take PO's safely    1.  Position patient on side   2.  Give 50 ml D50 IV   3.  Recheck CBG in 15 minutes.                    w      If CBG is less than 70 mg/dl, repeat treatment and recheck until hypoglycemia is resolved.   4.  Notify MD for further orders.    SPECIAL CONSIDERATIONS:    a.  If no IV access                              w     Give Glucagon IM                                              i.  1 mg if patient weighs more than 45.5 kg                                             ii.  0.5 mg if patient weighs less than 45.5 kg                              w      Start IV of D5W at 50 ml/hr and give 50 ml D50 IV   b.  If no IV access and active seizure                               w       Call Rapid Response   c.  If unable to gain IV access, give Glucagon IM:                              w          1 mg if patient weighs more than 45.5 kg  w          0.5 mg if patient weighs less than 45.5 kg   d.  Notify MD for further orders.  C. Complete smart text progress note to document intervention and follow-up CBG   1. In [facility] patient chart, click on Notes (left side of screen)   2. Create Progress Note   3. Click on [employer].  In the Match box type "hypo" and enter    4. Double click on CHL IP HYPOGLYCEMIC EVENT and enter data   5. MD must be notified if patient is NPO or experienced severe hypoglycemia

## 2013-10-10 LAB — GLUCOSE, CAPILLARY
GLUCOSE-CAPILLARY: 133 mg/dL — AB (ref 70–99)
Glucose-Capillary: 110 mg/dL — ABNORMAL HIGH (ref 70–99)
Glucose-Capillary: 119 mg/dL — ABNORMAL HIGH (ref 70–99)
Glucose-Capillary: 146 mg/dL — ABNORMAL HIGH (ref 70–99)
Glucose-Capillary: 251 mg/dL — ABNORMAL HIGH (ref 70–99)
Glucose-Capillary: 68 mg/dL — ABNORMAL LOW (ref 70–99)
Glucose-Capillary: 84 mg/dL (ref 70–99)

## 2013-10-10 LAB — TYPE AND SCREEN
ABO/RH(D): A POS
ANTIBODY SCREEN: NEGATIVE
UNIT DIVISION: 0
Unit division: 0

## 2013-10-10 LAB — AFP TUMOR MARKER: AFP-Tumor Marker: 5.3 ng/mL (ref 0.0–8.0)

## 2013-10-10 LAB — CBC
HCT: 27.9 % — ABNORMAL LOW (ref 36.0–46.0)
Hemoglobin: 9 g/dL — ABNORMAL LOW (ref 12.0–15.0)
MCH: 26.8 pg (ref 26.0–34.0)
MCHC: 32.3 g/dL (ref 30.0–36.0)
MCV: 83 fL (ref 78.0–100.0)
Platelets: 80 10*3/uL — ABNORMAL LOW (ref 150–400)
RBC: 3.36 MIL/uL — ABNORMAL LOW (ref 3.87–5.11)
RDW: 16.3 % — ABNORMAL HIGH (ref 11.5–15.5)
WBC: 4.1 10*3/uL (ref 4.0–10.5)

## 2013-10-10 NOTE — Progress Notes (Signed)
[facility] PROGRESS NOTE  Cynthia Long WUJ:[id number] DOB: 10/13/1940 DOA: 10/08/2013 PCP: Macon Large, MD  Assessment/Plan: Principal Problem:   GI bleed - Currently unclear source. Pt is s/p EGD with no significant findings per GI's last note. - Bleeding has ceased. GI on board and currently plan is for enteroscopy.  If no source identified and plan will be for capsule endoscopy which can be done as outpatient.  Active Problems:   Acute posthemorrhagic anemia - Cessation of bleeding. -Will monitor for another 24 hours. Hemoglobin steady at 9.0. -Patient is status post 2 units of packed red blood cells    Nausea with vomiting - resolved    Type II or unspecified type diabetes mellitus without mention of complication, not stated as uncontrolled - Once tolerating full liquid diet we'll plan on transitioning to diabetic diet - At this juncture continue sliding scale insulin    Ascites - chronic management per GI   Code Status: full Family Communication: discussed with patient and family at bedside. Disposition Plan: With improvement in oral intake, cessation of GI bleed, and steady hemoglobin levels   Consultants:  GI  Procedures:  CT of abdomen and pelvis  EGD  Antibiotics:  None  HPI/Subjective: Pt has no new complaints. No acute issues reported overnight.  Objective: Filed Vitals:   10/10/13 1406  BP: 128/51  Pulse: 79  Temp: 97.9 F (36.6 C)  Resp: 18    Intake/Output Summary (Last 24 hours) at 10/10/13 1601 Last data filed at 10/10/13 1400  Gross per 24 hour  Intake 1589.17 ml  Output   3575 ml  Net -1985.83 ml   Filed Weights   10/08/13 1459  Weight: 61.871 kg (136 lb 6.4 oz)    Exam:   General:  Pt in nad, alert and awake  Cardiovascular: rrr, no mrg  Respiratory: cta bl, no wheezes  Abdomen: soft, ND  Musculoskeletal: no cyanosis or clubbing   Data Reviewed: Basic Metabolic Panel:  Recent Labs Lab 10/08/13 1105  10/09/13 0827  NA 130* 139  K 5.0 4.2  CL 95* 103  CO2 21 19  GLUCOSE 248* 102*  BUN 31* 21  CREATININE 0.81 1.03  CALCIUM 10.2 9.3   Liver Function Tests:  Recent Labs Lab 10/08/13 1221 10/09/13 0827  AST 30 34  ALT 25 24  ALKPHOS 105 89  BILITOT 1.0 1.9*  PROT 6.6 5.8*  ALBUMIN 3.4* 3.1*    Recent Labs Lab 10/08/13 1105  LIPASE 49    Recent Labs Lab 10/08/13 1132  AMMONIA 26   CBC:  Recent Labs Lab 10/08/13 1105 10/08/13 2105 10/09/13 0827 10/10/13 0018  WBC 5.0 5.8 5.5 4.1  NEUTROABS 4.0  --   --   --   HGB 6.9* 5.6* 9.0* 9.0*  HCT 22.3* 18.0* 27.2* 27.9*  MCV 80.2 79.6 81.4 83.0  PLT 102* 96* 97* 80*   Cardiac Enzymes: No results found for this basename: CKTOTAL, CKMB, CKMBINDEX, TROPONINI,  in the last 168 hours BNP (last 3 results) No results found for this basename: PROBNP,  in the last 8760 hours CBG:  Recent Labs Lab 10/09/13 2341 10/10/13 0002 10/10/13 0406 10/10/13 0729 10/10/13 1118  GLUCAP 68* 84 110* 119* 251*    No results found for this or any previous visit (from the past 240 hour(s)).   Studies: Ct Abdomen Pelvis W Contrast  10/09/2013   CLINICAL DATA:  NASH cirrhosis, coffee ground emesis, GI bleed  EXAM: CT ABDOMEN AND PELVIS WITH CONTRAST  TECHNIQUE: Multidetector CT imaging of the abdomen and pelvis was performed using the standard protocol following bolus administration of intravenous contrast.  CONTRAST:  128mL OMNIPAQUE IOHEXOL 300 MG/ML  SOLN  COMPARISON:  Abdominal ultrasound dated 08/31/2013  FINDINGS: Trace bilateral pleural effusions with mild left basilar atelectasis.  Cirrhotic configuration of the liver. No suspicious/enhancing hepatic lesions.  Splenomegaly, measuring 15.8 cm in maximal craniocaudal dimension.  Small gastroesophageal and perigastric varices (series 2/images 13 and 17). Portal vein is patent.  Moderate abdominopelvic ascites.  Small upper abdominal lymph nodes, likely reactive.  Gallbladder is  notable for a small layering gallstone (series 2/ image 32), without associated inflammatory changes.  Pancreas and adrenal glands are within normal limits.  Kidneys are within normal limits.  No hydronephrosis.  No evidence of bowel obstruction. Normal appendix. Scattered areas of mild colonic wall thickening are favored to reflect mucosal edema related to ascites.  Atherosclerotic calcifications of the abdominal aorta and branch vessels.  Status post hysterectomy.  Bilateral ovaries are unremarkable.  Bladder is within normal limits.  Degenerative changes of the visualized thoracolumbar spine. Mild superior endplate changes at L2, age indeterminate, with mild retropulsion (sagittal image 90).  IMPRESSION: Cirrhosis.  Stigma of portal hypertension including splenomegaly and a small gastroesophageal and perigastric varices. Portal vein is patent.  Moderate abdominopelvic ascites.  Cholelithiasis.  Additional ancillary findings as above.   Electronically Signed   By: Julian Hy M.D.   On: 10/09/2013 15:04    Scheduled Meds: . antiseptic oral rinse  7 mL Mouth Rinse BID  . chlorhexidine  15 mL Mouth Rinse BID  . insulin aspart  0-9 Units Subcutaneous 6 times per day  . pantoprazole (PROTONIX) IV  40 mg Intravenous Q12H  . sodium chloride  3 mL Intravenous Q12H   Continuous Infusions: . sodium chloride 50 mL/hr at 10/10/13 0725    Time spent: > 35 minutes    Velvet Bathe  [facility] Pager [phone] If 7PM-7AM, please contact night-coverage at [email or web address], password [facility] 10/10/2013, 4:01 PM  LOS: 2 days

## 2013-10-10 NOTE — Progress Notes (Signed)
Patient seen, examined, and I agree with the above documentation, including the assessment and plan. CT with evidence of known cirrhosis with portal hypertension Plan for SBE tomorrow for further investigation of hematemesis (occurring twice in the last 2 months without clear source)

## 2013-10-10 NOTE — Progress Notes (Signed)
Patient ID: Cynthia Long, female   DOB: 10-18-1940, 73 y.o.   MRN: [id number]  [facility] Progress Note  Subjective: Feeling OK, no melena, no nausea or vomiting. Ct abdomen/Pelvis- Cirrhotic liver, splenomegaly, small esophageal and perigastric varices, ascites  Objective:  Vital signs in last 24 hours: Temp:  [98 F (36.7 C)-98.1 F (36.7 C)] 98.1 F (36.7 C) (08/09 0454) Pulse Rate:  [76-88] 76 (08/09 0454) Resp:  [16-18] 16 (08/09 0454) BP: (125-129)/(45-56) 125/56 mmHg (08/09 0454) SpO2:  [95 %-98 %] 98 % (08/09 0454) Last BM Date: 10/08/13 General:   Alert,  Well-developed, older WF   in NAD Heart:  Regular rate and rhythm; no murmurs Pulm;clear Abdomen:  Soft, nontender and nondistended, non tense ascites. Normal bowel sounds, without guarding, and without rebound.   Extremities:  Without edema. Neurologic:  Alert and  oriented x4;  grossly normal neurologically. Psych:  Alert and cooperative. Normal mood and affect.  Intake/Output from previous day: 08/08 0701 - 08/09 0700 In: 1671.7 [P.O.:960; I.V.:711.7] Out: 3900 [Urine:3900] Intake/Output this shift: Total I/O In: 834.2 [I.V.:834.2] Out: -   Lab Results:  Recent Labs  10/08/13 2105 10/09/13 0827 10/10/13 0018  WBC 5.8 5.5 4.1  HGB 5.6* 9.0* 9.0*  HCT 18.0* 27.2* 27.9*  PLT 96* 97* 80*   BMET  Recent Labs  10/08/13 1105 10/09/13 0827  NA 130* 139  K 5.0 4.2  CL 95* 103  CO2 21 19  GLUCOSE 248* 102*  BUN 31* 21  CREATININE 0.81 1.03  CALCIUM 10.2 9.3   LFT  Recent Labs  10/08/13 1221 10/09/13 0827  PROT 6.6 5.8*  ALBUMIN 3.4* 3.1*  AST 30 34  ALT 25 24  ALKPHOS 105 89  BILITOT 1.0 1.9*  BILIDIR 0.3  --   IBILI 0.7  --    PT/INR  Recent Labs  10/08/13 1105  LABPROT 16.1*  INR 1.29     Assessment / Plan: #1 73 yo female with 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 CT does not show obvious small bowel varices- I think she needs EGD with enteroscopy before discharge to r/o proximal small bowel source- and if negative can do capsule out pt , and observe  Will advance to solid food today Schedule for enteroscopy tomorrow with Dr, Dellie Catholic. should start B blocker as well Principal Problem:   GI bleed Active Problems:   Acute posthemorrhagic anemia   Nausea with vomiting   Type II or unspecified type diabetes mellitus without mention of complication, not stated as uncontrolled   Ascites   Coffee ground emesis     LOS: 2 days   Cynthia Long  10/10/2013, 9:18 AM

## 2013-10-10 NOTE — Plan of Care (Signed)
Problem: Consults Goal: GI Bleeding Patient Education See Patient Education Module for education specifics.  Outcome: Completed/Met Date Met:  10/10/13 Handouts given on admission

## 2013-10-11 ENCOUNTER — Encounter (HOSPITAL_COMMUNITY): Payer: Self-pay | Admitting: Internal Medicine

## 2013-10-11 ENCOUNTER — Encounter (HOSPITAL_COMMUNITY)
Admission: EM | Disposition: A | Discharge status: Home / Self Care | Payer: Self-pay | Source: Home / Self Care | Attending: Internal Medicine

## 2013-10-11 DIAGNOSIS — K921 Melena: Secondary | ICD-10-CM

## 2013-10-11 DIAGNOSIS — R188 Other ascites: Secondary | ICD-10-CM

## 2013-10-11 DIAGNOSIS — K920 Hematemesis: Secondary | ICD-10-CM | POA: Diagnosis not present

## 2013-10-11 HISTORY — PX: ENTEROSCOPY: SHX5533

## 2013-10-11 LAB — GLUCOSE, CAPILLARY
Glucose-Capillary: 132 mg/dL — ABNORMAL HIGH (ref 70–99)
Glucose-Capillary: 140 mg/dL — ABNORMAL HIGH (ref 70–99)
Glucose-Capillary: 144 mg/dL — ABNORMAL HIGH (ref 70–99)

## 2013-10-11 SURGERY — ENTEROSCOPY
Anesthesia: Moderate Sedation

## 2013-10-11 MED ORDER — FENTANYL CITRATE 0.05 MG/ML IJ SOLN
INTRAMUSCULAR | Status: AC
  Filled 2013-10-11: qty 2

## 2013-10-11 MED ORDER — BUTAMBEN-TETRACAINE-BENZOCAINE 2-2-14 % EX AERO
INHALATION_SPRAY | CUTANEOUS | Status: DC | PRN
  Administered 2013-10-11: 2 via TOPICAL

## 2013-10-11 MED ORDER — FUROSEMIDE 20 MG PO TABS: 20.0000 mg | ORAL_TABLET | Freq: Every day | ORAL | Status: DC

## 2013-10-11 MED ORDER — DIPHENHYDRAMINE HCL 50 MG/ML IJ SOLN
INTRAMUSCULAR | Status: AC
  Filled 2013-10-11: qty 1

## 2013-10-11 MED ORDER — MIDAZOLAM HCL 10 MG/2ML IJ SOLN
INTRAMUSCULAR | Status: AC
Start: 2013-10-11 — End: 2013-10-11
  Filled 2013-10-11: qty 2

## 2013-10-11 MED ORDER — MIDAZOLAM HCL 10 MG/2ML IJ SOLN
INTRAMUSCULAR | Status: DC | PRN
  Administered 2013-10-11 (×3): 2 mg via INTRAVENOUS
  Administered 2013-10-11: 1 mg via INTRAVENOUS

## 2013-10-11 MED ORDER — FENTANYL CITRATE 0.05 MG/ML IJ SOLN
INTRAMUSCULAR | Status: DC | PRN
  Administered 2013-10-11 (×3): 25 ug via INTRAVENOUS

## 2013-10-11 NOTE — Op Note (Addendum)
[facility] [place], [address]   OPERATIVE PROCEDURE REPORT  PATIENT: Cynthia Long, Cynthia Long  MR#: [id number] BIRTHDATE: 12/26/1940 , 87  yrs. old GENDER: Female ENDOSCOPIST: Lafayette Dragon, MD REFERRED BY:  Inda Castle, M.D. PROCEDURE DATE: 10/11/2013 PROCEDURE:   Diagnostic small bowel enteroscopy ASA CLASS:   Class III INDICATIONS:1.  anemia.   2.  iron deficiency anemia.   3. hemoccult positive stools.   4.  patient with non-alcohol related cirrhosis.  Upper endoscopy on 10/08/2013 showed minimal gastritis. Hemoglobin 5.6.  CT scan shows small varices.  Ascites. Gallstones.Aortic calcifications,thick colonic wall.Marland Kitchen MEDICATIONS: These medications were titrated to patient response per physician's verbal order, Versed 7 mg IV, and Fentanyl 75 mcg IV  TOPICAL ANESTHETIC:   Cetacaine Spray  DESCRIPTION OF PROCEDURE:   After the risks benefits and alternatives of the procedure were thoroughly explained, informed consent was obtained.  The     endoscope was introduced through the mouth  and advanced to the duodenum , limited by Without limitations.   The instrument was slowly withdrawn as the mucosa was fully examined.    Esophagus: esophageal mucosa was normal in the proximal and mid esophagus. There were 2 strains of small esophageal varices in distal third of the esophagus which showed no stigmata of bleeding, there was a nonobstructing esophageal stricture at 35 cm from the incisors, distal to the stricture was a small reducible 1-2 cm hiatal hernia. 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 There were no erosions. There were streaks of erythema converging into pylorus. There was no blood in the stomach. Retroflexion of the endoscope revealed normal fundus and cardia. There were no gastric varices or AVMs,  Duodenum: duodenal bulb and descending duodenum was  normal. Jejunum: endoscope traversed through second and third portion of duodenum into jejunum and advanced to the level of 170 cm from the incisors which was distal to the ligament of Treitz. Mucosa I appeared normal. There were no AVMs. There was no blood in the small bowel lumen.as the endoscope was withdrawn there were noted several scoping induced abrasions     Re   The scope was then withdrawn from the patient and the procedure terminated.  COMPLICATIONS: There were no complications. ENDOSCOPIC IMPRESSION:  first grade I esophageal varices. 2 strings. No stigmata of bleeding Nonobstructing distal esophageal stricture, not dilated, small 1-2 cm hiatal hernia diffuse mild gastritis probably due to portal gastropathy. No gastric varices normal duodenum and proximal jejunum to 170 cm. No evidence of AVMs  RECOMMENDATIONS:  Okay to discharge patient today Follow hemoglobin and hematocrit closely as an outpatient Followup with [name]., pt's gastroenterologist continue  diuretics,, hold beta blockers as per her  Pulonologist request If blood count continues to drop and stool remains heme positive. Suggest repeat colonoscopy ( she has been scheduled for 10/19/2013) and small bowel capsule endoscopy continue PPI   REPEAT EXAM: for Capsule Endoscopy. depending on colonoscopy results  _______________________________ eSigned:  Lafayette Dragon, MD 10/11/2013 10:34 AM Revised: 10/11/2013 10:34 AM  CC:  PATIENT NAME:  [name], [name] MR#: [id number]

## 2013-10-11 NOTE — Discharge Summary (Addendum)
Physician Discharge Summary  Cynthia Long TDD:[id number] DOB: [birth year] DOA: 10/08/2013  PCP: Macon Large, MD  Admit date: 10/08/2013 Discharge date: 10/11/2013  Time spent: > 35 minutes  Recommendations for Outpatient Follow-up:  1. Please see d/c instructions below  Discharge Diagnoses:  Principal Problem:   GI bleed Active Problems:   Acute posthemorrhagic anemia   Nausea with vomiting   Type II or unspecified type diabetes mellitus without mention of complication, not stated as uncontrolled   Ascites   Coffee ground emesis   Discharge Condition: stable  Diet recommendation: diabetic diet.  Filed Weights   10/08/13 1459  Weight: 61.871 kg (136 lb 6.4 oz)    History of present illness:  From original HPI: 73 y.o. female with past medical history of HTN, DM 2 and recently diagnosed liver cirrhosis. Patient was in the hospital in the end of June of 2015 for hematemesis and ascites. Endoscopy was done at that time and showed no source of bleeding, patient diagnosed with gastritis and cirrhosis of discharge on antibiotics. This time patient came in because of dark emesis, started last night   Hospital Course:  Principal Problem:  GI bleed  - Currently unclear source as GI work up negative. Pt is s/p EGD with no significant findings per GI's last note.  - Bleeding has ceased. GI on board and currently plan is for enteroscopy. If no source identified and plan will be for capsule endoscopy which will be set up as outpatient. Colonoscopy also planned   Active Problems:  Acute posthemorrhagic anemia  - Cessation of bleeding.  -Will monitor for another 24 hours. Hemoglobin steady at 9.0.  -Patient is status post 2 units of packed red blood cells  - Will hold aspirin on discharge - GI recommended continuing Aldactone. Currently patient is not on Lasix. No beta blocker because of pulmonary issues. Addendum: will d/c on lasix as per GI recommendations  Nausea with vomiting  -  resolved   Type II or unspecified type diabetes mellitus without mention of complication, not stated as uncontrolled  - Diabetic diet - Pt is to continue monitoring her blood sugars routinely at least 2 times per day fasting and postprandial. -Patient is to continue home regimen  Ascites  - chronic management per GI    Procedures:  Endoscopy and Enteroscopy  Consultations:  GI: Dr. Olevia Perches  Discharge Exam: Filed Vitals:   10/11/13 1045  BP: 145/51  Pulse: 75  Temp: 97.4 F (36.3 C)  Resp: 18    General: pt in nad, alert and awake Cardiovascular: rrr, no mrg Respiratory: cta bl, no wheezes  Discharge Instructions You were cared for by a hospitalist during your hospital stay. If you have any questions about your discharge medications or the care you received while you were in the hospital after you are discharged, you can call the unit and asked to speak with the hospitalist on call if the hospitalist that took care of you is not available. Once you are discharged, your primary care physician will handle any further medical issues. Please note that NO REFILLS for any discharge medications will be authorized once you are discharged, as it is imperative that you return to your primary care physician (or establish a relationship with a primary care physician if you do not have one) for your aftercare needs so that they can reassess your need for medications and monitor your lab values.      Discharge Instructions   Call MD for:  difficulty breathing, headache  or visual disturbances    Complete by:  As directed      Call MD for:  temperature >100.4    Complete by:  As directed      Diet - low sodium heart healthy    Complete by:  As directed      Discharge instructions    Complete by:  As directed   Has colonoscopy scheduled for 8/18 with [name] Has office visit with [name] 9/18 at 8;45 am     Increase activity slowly    Complete by:  As directed              Medication List    STOP taking these medications       aspirin EC 81 MG tablet      TAKE these medications       budesonide 0.5 MG/2ML nebulizer solution  Commonly known as:  PULMICORT  Take 0.5 mg by nebulization daily as needed (for shortness of breath).     calcium-vitamin D 250-125 MG-UNIT per tablet  Commonly known as:  OSCAL WITH D  Take 6 tablets by mouth daily.     cromolyn 20 MG/2ML nebulizer solution  Commonly known as:  INTAL  Take 20 mg by nebulization daily as needed for wheezing.     diltiazem 120 MG 24 hr capsule  Commonly known as:  DILACOR XR  Take 120 mg by mouth daily.     furosemide 20 MG tablet  Commonly known as:  LASIX  Take 1 tablet (20 mg total) by mouth daily.     glimepiride 4 MG tablet  Commonly known as:  AMARYL  Take 4 mg by mouth daily with breakfast.     KOMBIGLYZE XR 07-998 MG Tb24  Generic drug:  Saxagliptin-Metformin  Take 1 tablet by mouth daily.     Na Sulfate-K Sulfate-Mg Sulf Soln  Commonly known as:  SUPREP BOWEL PREP  USE PER PREP INSTRUCTIONS     PRESCRIPTION MEDICATION  every Wednesday. Allergy Shots     spironolactone 100 MG tablet  Commonly known as:  ALDACTONE  Take 1 tablet (100 mg total) by mouth daily.       Allergies  Allergen Reactions  . Biaxin [Clarithromycin]     Liver enzymes elevated per pt  . Sulfa Antibiotics Diarrhea      The results of significant diagnostics from this hospitalization (including imaging, microbiology, ancillary and laboratory) are listed below for reference.    Significant Diagnostic Studies: Ct Abdomen Pelvis W Contrast  10/09/2013   CLINICAL DATA:  NASH cirrhosis, coffee ground emesis, GI bleed  EXAM: CT ABDOMEN AND PELVIS WITH CONTRAST  TECHNIQUE: Multidetector CT imaging of the abdomen and pelvis was performed using the standard protocol following bolus administration of intravenous contrast.  CONTRAST:  127mL OMNIPAQUE IOHEXOL 300 MG/ML  SOLN  COMPARISON:  Abdominal  ultrasound dated 08/31/2013  FINDINGS: Trace bilateral pleural effusions with mild left basilar atelectasis.  Cirrhotic configuration of the liver. No suspicious/enhancing hepatic lesions.  Splenomegaly, measuring 15.8 cm in maximal craniocaudal dimension.  Small gastroesophageal and perigastric varices (series 2/images 13 and 17). Portal vein is patent.  Moderate abdominopelvic ascites.  Small upper abdominal lymph nodes, likely reactive.  Gallbladder is notable for a small layering gallstone (series 2/ image 32), without associated inflammatory changes.  Pancreas and adrenal glands are within normal limits.  Kidneys are within normal limits.  No hydronephrosis.  No evidence of bowel obstruction. Normal appendix. Scattered areas of mild colonic  wall thickening are favored to reflect mucosal edema related to ascites.  Atherosclerotic calcifications of the abdominal aorta and branch vessels.  Status post hysterectomy.  Bilateral ovaries are unremarkable.  Bladder is within normal limits.  Degenerative changes of the visualized thoracolumbar spine. Mild superior endplate changes at L2, age indeterminate, with mild retropulsion (sagittal image 90).  IMPRESSION: Cirrhosis.  Stigma of portal hypertension including splenomegaly and a small gastroesophageal and perigastric varices. Portal vein is patent.  Moderate abdominopelvic ascites.  Cholelithiasis.  Additional ancillary findings as above.   Electronically Signed   By: Julian Hy M.D.   On: 10/09/2013 15:04   US Paracentesis  09/16/2013   CLINICAL DATA:  Ascites; cirrhosis  EXAM: ULTRASOUND GUIDED LEFT LOWER QUADRANT PARACENTESIS  COMPARISON:  None.  PROCEDURE: An ultrasound guided paracentesis was thoroughly discussed with the patient and questions answered. The benefits, risks, alternatives and complications were also discussed. The patient understands and wishes to proceed with the procedure. Written consent was obtained.  Ultrasound was performed to  localize and mark an adequate pocket of fluid in the left lower quadrant of the abdomen. The area was then prepped and draped in the normal sterile fashion. 1% Lidocaine was used for local anesthesia. Under ultrasound guidance a 19 gauge Yueh catheter was introduced. Paracentesis was performed. The catheter was removed and a dressing applied.  Complications: None.  FINDINGS: A total of approximately 4 Liters of yellow fluid was removed. A fluid sample was sent for laboratory analysis.  IMPRESSION: Successful ultrasound guided paracentesis yielding 4 Liters of ascites.  Post Procedure IV albumin per referring physician.  Maximum 4 Liters per referring physician.  Read by:  Monia Sabal, PA-C   Electronically Signed   By: Markus Daft M.D.   On: 09/16/2013 12:49    Microbiology: No results found for this or any previous visit (from the past 240 hour(s)).   Labs: Basic Metabolic Panel:  Recent Labs Lab 10/08/13 1105 10/09/13 0827  NA 130* 139  K 5.0 4.2  CL 95* 103  CO2 21 19  GLUCOSE 248* 102*  BUN 31* 21  CREATININE 0.81 1.03  CALCIUM 10.2 9.3   Liver Function Tests:  Recent Labs Lab 10/08/13 1221 10/09/13 0827  AST 30 34  ALT 25 24  ALKPHOS 105 89  BILITOT 1.0 1.9*  PROT 6.6 5.8*  ALBUMIN 3.4* 3.1*    Recent Labs Lab 10/08/13 1105  LIPASE 49    Recent Labs Lab 10/08/13 1132  AMMONIA 26   CBC:  Recent Labs Lab 10/08/13 1105 10/08/13 2105 10/09/13 0827 10/10/13 0018  WBC 5.0 5.8 5.5 4.1  NEUTROABS 4.0  --   --   --   HGB 6.9* 5.6* 9.0* 9.0*  HCT 22.3* 18.0* 27.2* 27.9*  MCV 80.2 79.6 81.4 83.0  PLT 102* 96* 97* 80*   Cardiac Enzymes: No results found for this basename: CKTOTAL, CKMB, CKMBINDEX, TROPONINI,  in the last 168 hours BNP: BNP (last 3 results) No results found for this basename: PROBNP,  in the last 8760 hours CBG:  Recent Labs Lab 10/10/13 1619 10/10/13 1944 10/10/13 2349 10/11/13 0353 10/11/13 0716  GLUCAP 133* 146* 140* 132* 144*        Signed:  Velvet Bathe  [facility] 10/11/2013, 1:34 PM

## 2013-10-11 NOTE — Progress Notes (Signed)
Patient ID: Cynthia Long, female   DOB: 11/21/1940, 73 y.o.   MRN: [id number]   OK for discharge today.  EGD -1+ varices, portal gastropathy. Has colonoscopy scheduled for 8/18 with [name] Has office visit with [name] 9/18 at 8;45 am  Discharge on PPI daily Continue current doses of lasix and aldactone No B- blocker  Because of pulmonary issues.  Attending MD note:   I have taken a history, examined the patient, and reviewed the chart. I agree with the Advanced Practitioner's impression and recommendations. Discussed with the patient.  [facility] Pager # [phone]

## 2013-10-11 NOTE — H&P (View-Only) (Signed)
Patient seen, examined, and I agree with the above documentation, including the assessment and plan. CT with evidence of known cirrhosis with portal hypertension Plan for SBE tomorrow for further investigation of hematemesis (occurring twice in the last 2 months without clear source)

## 2013-10-11 NOTE — Interval H&P Note (Signed)
History and Physical Interval Note:  10/11/2013 7:46 AM  Cynthia Long  has presented today for surgery, with the diagnosis of gi bleeding  The various methods of treatment have been discussed with the patient and family. After consideration of risks, benefits and other options for treatment, the patient has consented to  Procedure(s): ENTEROSCOPY (N/A) as a surgical intervention .  The patient's history has been reviewed, patient examined, no change in status, stable for surgery.  I have reviewed the patient's chart and labs.  Questions were answered to the patient's satisfaction.     Delfin Edis

## 2013-10-11 NOTE — H&P (View-Only) (Signed)
Patient ID: Cynthia Long, female   DOB: 1940-06-09, 73 y.o.   MRN: [id number]  [facility] Progress Note  Subjective: Feeling OK, no melena, no nausea or vomiting. Ct abdomen/Pelvis- Cirrhotic liver, splenomegaly, small esophageal and perigastric varices, ascites  Objective:  Vital signs in last 24 hours: Temp:  [98 F (36.7 C)-98.1 F (36.7 C)] 98.1 F (36.7 C) (08/09 0454) Pulse Rate:  [76-88] 76 (08/09 0454) Resp:  [16-18] 16 (08/09 0454) BP: (125-129)/(45-56) 125/56 mmHg (08/09 0454) SpO2:  [95 %-98 %] 98 % (08/09 0454) Last BM Date: 10/08/13 General:   Alert,  Well-developed, older WF   in NAD Heart:  Regular rate and rhythm; no murmurs Pulm;clear Abdomen:  Soft, nontender and nondistended, non tense ascites. Normal bowel sounds, without guarding, and without rebound.   Extremities:  Without edema. Neurologic:  Alert and  oriented x4;  grossly normal neurologically. Psych:  Alert and cooperative. Normal mood and affect.  Intake/Output from previous day: 08/08 0701 - 08/09 0700 In: 1671.7 [P.O.:960; I.V.:711.7] Out: 3900 [Urine:3900] Intake/Output this shift: Total I/O In: 834.2 [I.V.:834.2] Out: -   Lab Results:  Recent Labs  10/08/13 2105 10/09/13 0827 10/10/13 0018  WBC 5.8 5.5 4.1  HGB 5.6* 9.0* 9.0*  HCT 18.0* 27.2* 27.9*  PLT 96* 97* 80*   BMET  Recent Labs  10/08/13 1105 10/09/13 0827  NA 130* 139  K 5.0 4.2  CL 95* 103  CO2 21 19  GLUCOSE 248* 102*  BUN 31* 21  CREATININE 0.81 1.03  CALCIUM 10.2 9.3   LFT  Recent Labs  10/08/13 1221 10/09/13 0827  PROT 6.6 5.8*  ALBUMIN 3.4* 3.1*  AST 30 34  ALT 25 24  ALKPHOS 105 89  BILITOT 1.0 1.9*  BILIDIR 0.3  --   IBILI 0.7  --    PT/INR  Recent Labs  10/08/13 1105  LABPROT 16.1*  INR 1.29     Assessment / Plan: #1 73 yo female with 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 CT does not show obvious small bowel varices- I think she needs EGD with enteroscopy before discharge to r/o proximal small bowel source- and if negative can do capsule out pt , and observe  Will advance to solid food today Schedule for enteroscopy tomorrow with Dr, Dellie Catholic. should start B blocker as well Principal Problem:   GI bleed Active Problems:   Acute posthemorrhagic anemia   Nausea with vomiting   Type II or unspecified type diabetes mellitus without mention of complication, not stated as uncontrolled   Ascites   Coffee ground emesis     LOS: 2 days   Dakisha Schoof  10/10/2013, 9:18 AM

## 2013-10-11 NOTE — Care Management Note (Signed)
    Page 1 of 1   10/11/2013     3:49:05 PM CARE MANAGEMENT NOTE 10/11/2013  Patient:  Cynthia Long, Cynthia Long   Account Number:  [id number]  Date Initiated:  10/11/2013  Documentation initiated by:  Dessa Phi  Subjective/Objective Assessment:   73 Y/O F ADMITTED W/GIB.     Action/Plan:   FROM HOME.   Anticipated DC Date:  10/11/2013   Anticipated DC Plan:  [place]  CM consult      Choice offered to / List presented to:             Status of service:  Completed, signed off Medicare Important Message given?   (If response is "NO", the following Medicare IM given date fields will be blank) Date Medicare IM given:   Medicare IM given by:   Date Additional Medicare IM given:   Additional Medicare IM given by:    Discharge Disposition:  HOME/SELF CARE  Per UR Regulation:  Reviewed for med. necessity/level of care/duration of stay  If discussed at [employer] of Stay Meetings, dates discussed:    Comments:  10/11/13 Jachob Mcclean RN,BSN NCM [phone] D/C HOME NO [place].

## 2013-10-12 ENCOUNTER — Encounter (HOSPITAL_COMMUNITY): Payer: Self-pay | Admitting: Internal Medicine

## 2013-10-14 ENCOUNTER — Encounter: Payer: Self-pay | Admitting: Gastroenterology

## 2013-10-14 LAB — GLUCOSE, CAPILLARY: Glucose-Capillary: 174 mg/dL — ABNORMAL HIGH (ref 70–99)

## 2013-10-19 ENCOUNTER — Ambulatory Visit (AMBULATORY_SURGERY_CENTER): Payer: PRIVATE HEALTH INSURANCE | Admitting: Gastroenterology

## 2013-10-19 ENCOUNTER — Encounter: Payer: Self-pay | Admitting: Gastroenterology

## 2013-10-19 ENCOUNTER — Other Ambulatory Visit: Payer: Self-pay

## 2013-10-19 ENCOUNTER — Other Ambulatory Visit (INDEPENDENT_AMBULATORY_CARE_PROVIDER_SITE_OTHER): Payer: PRIVATE HEALTH INSURANCE

## 2013-10-19 VITALS — BP 148/64 | HR 80 | Temp 98.4°F | Resp 16 | Ht 63.0 in | Wt 138.0 lb

## 2013-10-19 DIAGNOSIS — K7460 Unspecified cirrhosis of liver: Secondary | ICD-10-CM

## 2013-10-19 DIAGNOSIS — D126 Benign neoplasm of colon, unspecified: Secondary | ICD-10-CM

## 2013-10-19 DIAGNOSIS — K922 Gastrointestinal hemorrhage, unspecified: Secondary | ICD-10-CM

## 2013-10-19 DIAGNOSIS — D649 Anemia, unspecified: Secondary | ICD-10-CM

## 2013-10-19 LAB — GLUCOSE, CAPILLARY
GLUCOSE-CAPILLARY: 80 mg/dL (ref 70–99)
Glucose-Capillary: 105 mg/dL — ABNORMAL HIGH (ref 70–99)

## 2013-10-19 LAB — BASIC METABOLIC PANEL
BUN: 8 mg/dL (ref 6–23)
CO2: 23 meq/L (ref 19–32)
Calcium: 9.1 mg/dL (ref 8.4–10.5)
Chloride: 101 mEq/L (ref 96–112)
Creatinine, Ser: 0.9 mg/dL (ref 0.4–1.2)
GFR: 64.01 mL/min (ref >60.00)
Glucose, Bld: 87 mg/dL (ref 70–99)
Potassium: 3.7 mEq/L (ref 3.5–5.1)
SODIUM: 137 meq/L (ref 135–145)

## 2013-10-19 MED ORDER — DEXTROSE 5 % IV SOLN: INTRAVENOUS | Status: DC

## 2013-10-19 NOTE — Progress Notes (Signed)
Report to PACU, RN, vss, BBS= Clear.  

## 2013-10-19 NOTE — Patient Instructions (Addendum)
YOU WILL HAVE BLOODWORK TODAY.  CALL DR. KAPLAN'S OFFICE IN AM TO SCHEDULE AN OFFICE VISIT IN 6 WEEKS.     YOU HAD AN ENDOSCOPIC PROCEDURE TODAY AT [facility]: Refer to the procedure report that was given to you for any specific questions about what was found during the examination.  If the procedure report does not answer your questions, please call your gastroenterologist to clarify.  If you requested that your care partner not be given the details of your procedure findings, then the procedure report has been included in a sealed envelope for you to review at your convenience later.  YOU SHOULD EXPECT: Some feelings of bloating in the abdomen. Passage of more gas than usual.  Walking can help get rid of the air that was put into your GI tract during the procedure and reduce the bloating. If you had a lower endoscopy (such as a colonoscopy or flexible sigmoidoscopy) you may notice spotting of blood in your stool or on the toilet paper. If you underwent a bowel prep for your procedure, then you may not have a normal bowel movement for a few days.  DIET: Your first meal following the procedure should be a light meal and then it is ok to progress to your normal diet.  A half-sandwich or bowl of soup is an example of a good first meal.  Heavy or fried foods are harder to digest and may make you feel nauseous or bloated.  Likewise meals heavy in dairy and vegetables can cause extra gas to form and this can also increase the bloating.  Drink plenty of fluids but you should avoid alcoholic beverages for 24 hours.  ACTIVITY: Your care partner should take you home directly after the procedure.  You should plan to take it easy, moving slowly for the rest of the day.  You can resume normal activity the day after the procedure however you should NOT DRIVE or use heavy machinery for 24 hours (because of the sedation medicines used during the test).    SYMPTOMS TO REPORT IMMEDIATELY: A  gastroenterologist can be reached at any hour.  During normal business hours, 8:30 AM to 5:00 PM Monday through Friday, call [phone].  After hours and on weekends, please call the GI answering service at [phone] who will take a message and have the physician on call contact you.   Following lower endoscopy (colonoscopy or flexible sigmoidoscopy):  Excessive amounts of blood in the stool  Significant tenderness or worsening of abdominal pains  Swelling of the abdomen that is new, acute  Fever of 100F or higher  FOLLOW UP: If any biopsies were taken you will be contacted by phone or by letter within the next 1-3 weeks.  Call your gastroenterologist if you have not heard about the biopsies in 3 weeks.  Our staff will call the home number listed on your records the next business day following your procedure to check on you and address any questions or concerns that you may have at that time regarding the information given to you following your procedure. This is a courtesy call and so if there is no answer at the home number and we have not heard from you through the emergency physician on call, we will assume that you have returned to your regular daily activities without incident.  SIGNATURES/CONFIDENTIALITY: You and/or your care partner have signed paperwork which will be entered into your electronic medical record.  These signatures attest to the fact  that that the information above on your After Visit Summary has been reviewed and is understood.  Full responsibility of the confidentiality of this discharge information lies with you and/or your care-partner.

## 2013-10-19 NOTE — Op Note (Signed)
[place]  [address]. [place], [address]   COLONOSCOPY PROCEDURE REPORT  PATIENT: Cynthia, Long  MR#: [id number] BIRTHDATE: [birth year] , 23  yrs. old GENDER: Female ENDOSCOPIST: Inda Castle, MD REFERRED BY: PROCEDURE DATE:  10/19/2013 PROCEDURE:   Colonoscopy with snare polypectomy First Screening Colonoscopy - Avg.  risk and is 50 yrs.  old or older Yes.  Prior Negative Screening - Now for repeat screening. N/A  History of Adenoma - Now for follow-up colonoscopy & has been > or = to 3 yrs.  N/A  Polyps Removed Today? Yes. ASA CLASS:   Class III INDICATIONS:melena. MEDICATIONS: MAC sedation, administered by CRNA and propofol (Diprivan) 250mg  IV  DESCRIPTION OF PROCEDURE:   After the risks benefits and alternatives of the procedure were thoroughly explained, informed consent was obtained.  A digital rectal exam revealed no abnormalities of the rectum.   The LB [id number]  endoscope was introduced through the anus and advanced to the cecum, which was identified by both the appendix and ileocecal valve. No adverse events experienced.   The quality of the prep was Suprep good  The instrument was then slowly withdrawn as the colon was fully examined.      COLON FINDINGS: A semi-pedunculated polyp measuring 12 mm in size was found in the sigmoid colon.  A polypectomy was performed using snare cautery.  The resection was complete and the polyp tissue was completely retrieved.   A sessile polyp measuring 3 mm in size was found in the sigmoid colon.  A polypectomy was performed with a cold snare.  A polypectomy was performed.   The colon was otherwise normal.  There was no diverticulosis, inflammation, polyps or cancers unless previously stated. There was some friability of the mucosa. Retroflexed views revealed no abnormalities. The time to cecum=3 minutes 13 seconds.  Withdrawal time=12 minutes 15 seconds. The scope was withdrawn and the procedure  completed. COMPLICATIONS: There were no complications.  ENDOSCOPIC IMPRESSION: 1.   Semi-pedunculated polyp measuring 12 mm in size was found in the sigmoid colon; polypectomy was performed using snare cautery 2.   Sessile polyp measuring 3 mm in size was found in the sigmoid colon; polypectomy was performed with a cold snare; polypectomy was performed 3.   The colon was otherwise normal  No source for acute GI bleeding is identified  RECOMMENDATIONS: 1.  If the polyp(s) removed today are proven to be adenomatous (pre-cancerous) polyps, you will need a colonoscopy in 3 years. Otherwise you should continue to follow colorectal cancer screening guidelines for "routine risk" patients with a colonoscopy in 10 years.  You will receive a letter within 1-2 weeks with the results of your biopsy as well as final recommendations.  Please call my office if you have not received a letter after 3 weeks. 2.  Capsule endoscopy   eSigned:  Inda Castle, MD 10/19/2013 3:57 PM   cc: Tish Men, MD   PATIENT NAME:  Cynthia Long, Cynthia Long MR#: [id number]

## 2013-10-19 NOTE — Progress Notes (Signed)
Called to room to assist during endoscopic procedure.  Patient ID and intended procedure confirmed with present staff. Received instructions for my participation in the procedure from the performing physician.  

## 2013-10-20 ENCOUNTER — Telehealth: Payer: Self-pay | Admitting: *Deleted

## 2013-10-20 NOTE — Telephone Encounter (Signed)
  Follow up Call-  Call back number 10/19/2013  Post procedure Call Back phone  # [phone]  Permission to leave phone message Yes     Patient questions:  Do you have a fever, pain , or abdominal swelling? No. Pain Score  0 *  Have you tolerated food without any problems? Yes.    Have you been able to return to your normal activities? Yes.    Do you have any questions about your discharge instructions: Diet   No. Medications  No. Follow up visit  Yes.    Do you have questions or concerns about your Care? No.  Actions: * If pain score is 4 or above: No action needed, pain <4.  Pt. Wanted to know if need to schedule 6 weeks follow up appt. With me. Number provided for pt. To call for scheduling appt.

## 2013-10-21 ENCOUNTER — Telehealth: Payer: Self-pay | Admitting: Gastroenterology

## 2013-10-21 ENCOUNTER — Telehealth: Payer: Self-pay

## 2013-10-21 ENCOUNTER — Other Ambulatory Visit (INDEPENDENT_AMBULATORY_CARE_PROVIDER_SITE_OTHER): Payer: PRIVATE HEALTH INSURANCE

## 2013-10-21 DIAGNOSIS — K921 Melena: Secondary | ICD-10-CM

## 2013-10-21 NOTE — Telephone Encounter (Signed)
Patient agrees to come in for Endo capsule teaching on 11/01/13 at 2:30 pm.

## 2013-10-21 NOTE — Telephone Encounter (Signed)
Capsule endoscopy needed. Patient aware.She will discuss with her children and call back with day she can come in for instructions.

## 2013-10-22 ENCOUNTER — Telehealth: Payer: Self-pay

## 2013-10-22 NOTE — Telephone Encounter (Signed)
She certainly can return to work.  Please provide a letter that I can sign.  Thanks

## 2013-10-22 NOTE — Telephone Encounter (Signed)
Pt states she was placed on disability in June by Dr. Rebecca Eaton Myers-hospitalist.Pt states her disability is about to run out and she needs a note from [name] stating it is ok of her to return to work. 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 Please advise.

## 2013-10-25 ENCOUNTER — Encounter: Payer: Self-pay | Admitting: Gastroenterology

## 2013-10-25 NOTE — Telephone Encounter (Signed)
Spoke with the patient. She has a total of 6 months of disability through her employer. She will wait until all tests are complete before her return to work. Agrees to let me know the exact date she would like to return to work. At that point, I can write the letter for you to sign. Thank you.

## 2013-11-01 ENCOUNTER — Other Ambulatory Visit: Payer: Self-pay | Admitting: Gastroenterology

## 2013-11-01 ENCOUNTER — Ambulatory Visit (INDEPENDENT_AMBULATORY_CARE_PROVIDER_SITE_OTHER): Payer: Self-pay | Admitting: Gastroenterology

## 2013-11-01 DIAGNOSIS — K922 Gastrointestinal hemorrhage, unspecified: Secondary | ICD-10-CM

## 2013-11-01 NOTE — Telephone Encounter (Signed)
Cynthia Long,   Patient's pharmacy is requesting refill of Aldactone. Is it okay to refill?

## 2013-11-01 NOTE — Telephone Encounter (Signed)
Ok to refill.  Jess

## 2013-11-01 NOTE — Progress Notes (Signed)
Patient here for capsule endo teaching. Verbalizes understanding for all written and verbal instructions. 

## 2013-11-10 ENCOUNTER — Ambulatory Visit (INDEPENDENT_AMBULATORY_CARE_PROVIDER_SITE_OTHER): Payer: PRIVATE HEALTH INSURANCE | Admitting: Gastroenterology

## 2013-11-10 DIAGNOSIS — K922 Gastrointestinal hemorrhage, unspecified: Secondary | ICD-10-CM

## 2013-11-10 NOTE — Progress Notes (Signed)
Patient here for capsule endoscopy. Tolerated procedure. Verbalizes understanding of written and verbal instructions. LDJ#[id number] Exp:09/2014

## 2013-11-19 ENCOUNTER — Encounter: Payer: Self-pay | Admitting: Gastroenterology

## 2013-11-19 ENCOUNTER — Ambulatory Visit (INDEPENDENT_AMBULATORY_CARE_PROVIDER_SITE_OTHER): Payer: PRIVATE HEALTH INSURANCE | Admitting: Gastroenterology

## 2013-11-19 VITALS — BP 158/60 | HR 88 | Ht 63.0 in | Wt 139.0 lb

## 2013-11-19 DIAGNOSIS — K7460 Unspecified cirrhosis of liver: Secondary | ICD-10-CM

## 2013-11-19 DIAGNOSIS — R188 Other ascites: Secondary | ICD-10-CM

## 2013-11-19 DIAGNOSIS — Z23 Encounter for immunization: Secondary | ICD-10-CM

## 2013-11-19 NOTE — Assessment & Plan Note (Signed)
Controlled with Aldactone and Lasix

## 2013-11-19 NOTE — Assessment & Plan Note (Signed)
Etiology for acute GI bleed was never determined.  She's had no recurrences.  No further studies at this time

## 2013-11-19 NOTE — Addendum Note (Signed)
Addended by: Audrea Muscat on: 11/19/2013 09:39 AM   Modules accepted: Orders

## 2013-11-19 NOTE — Assessment & Plan Note (Signed)
Patient's hepatic function appears to be stable.  Plan to continue with current medications and to vaccinate for hepatitis A and B.  She already has received a Pneumovax.

## 2013-11-19 NOTE — Progress Notes (Signed)
      History of Present Illness:  Cynthia Long  has returned for followup of cirrhosis.  Serologies for hepatitis A, B, and C were negative.  She likely has cirrhosis secondary to [place].  She has been maintained on Aldactone and Lasix and weight has been stable.  She's had no further episodes of overt GI bleeding.  GI workup including upper and lower endoscopy, small bowel enteroscopy and capsule studies were not diagnostic for a GI bleeding source.  She has grade 1 varices and perhaps tiny AVMs in the small bowel.    Review of Systems: Pertinent positive and negative review of systems were noted in the above HPI section. All other review of systems were otherwise negative.    Current Medications, Allergies, Past Medical History, Past Surgical History, Family History and Social History were reviewed in [facility] record  Vital signs were reviewed in today's medical record. Physical Exam: General: Well developed , well nourished, no acute distress Skin: anicteric Head: Normocephalic and atraumatic Eyes:  sclerae anicteric, EOMI Ears: Normal auditory acuity Mouth: No deformity or lesions Lungs: Clear throughout to auscultation Heart: Regular rate and rhythm; no murmurs, rubs or bruits Abdomen: Soft, non tender and mildly protuberant. No masses, hepatosplenomegaly or hernias noted. Normal Bowel sounds.  There is no obvious ascites Rectal:deferred Musculoskeletal: Symmetrical with no gross deformities  Pulses:  Normal pulses noted Extremities: No clubbing, cyanosis, edema or deformities noted Neurological: Alert oriented x 4, grossly nonfocal Psychological:  Alert and cooperative. Normal mood and affect  See Assessment and Plan under Problem List

## 2013-11-19 NOTE — Patient Instructions (Signed)
You have been given a twin rix vaccine.  Your next one is due 11/26/2013    Please follow up with [name] in three months

## 2013-11-26 ENCOUNTER — Encounter (INDEPENDENT_AMBULATORY_CARE_PROVIDER_SITE_OTHER): Payer: PRIVATE HEALTH INSURANCE | Admitting: Gastroenterology

## 2013-11-26 DIAGNOSIS — Z23 Encounter for immunization: Secondary | ICD-10-CM

## 2013-12-01 ENCOUNTER — Telehealth: Payer: Self-pay

## 2013-12-01 NOTE — Telephone Encounter (Signed)
Patient advised of her negative capsule endoscopy study. Questions invited and answered. Patient reports she is doing well.

## 2013-12-03 ENCOUNTER — Encounter: Payer: Self-pay | Admitting: Gastroenterology

## 2013-12-17 ENCOUNTER — Ambulatory Visit (INDEPENDENT_AMBULATORY_CARE_PROVIDER_SITE_OTHER): Payer: PRIVATE HEALTH INSURANCE | Admitting: Gastroenterology

## 2013-12-17 DIAGNOSIS — Z23 Encounter for immunization: Secondary | ICD-10-CM

## 2013-12-17 MED ORDER — HEPATITIS A-HEP B RECOMB VAC 720-20 ELU-MCG/ML IM SUSP: 1.0000 mL | INTRAMUSCULAR | Status: DC

## 2014-01-07 ENCOUNTER — Other Ambulatory Visit: Payer: Self-pay | Admitting: Gastroenterology

## 2014-01-09 ENCOUNTER — Other Ambulatory Visit: Payer: Self-pay | Admitting: Gastroenterology

## 2014-02-07 ENCOUNTER — Ambulatory Visit (INDEPENDENT_AMBULATORY_CARE_PROVIDER_SITE_OTHER): Payer: PRIVATE HEALTH INSURANCE | Admitting: Gastroenterology

## 2014-02-07 ENCOUNTER — Encounter: Payer: Self-pay | Admitting: Gastroenterology

## 2014-02-07 VITALS — BP 166/80 | HR 120 | Ht 63.0 in | Wt 145.2 lb

## 2014-02-07 DIAGNOSIS — R188 Other ascites: Secondary | ICD-10-CM

## 2014-02-07 NOTE — Assessment & Plan Note (Signed)
Patient appears to have reaccumulated ascites as evidenced by exam and weight.  Recommendations #1 increase Lasix from 20-40 mg while continuing Aldactone 100 mg daily #2 patient was instructed to weigh herself daily.  Should weight decreased to 135-136 she was instructed to call since this seems to be her baseline dry weight

## 2014-02-07 NOTE — Patient Instructions (Signed)
Increase Lasix to 40mg  daily Continue Aldactone Call back if no weight loss in 5 days or call back if weight decreases to 135 lbs  Follow up in 3 months

## 2014-02-07 NOTE — Progress Notes (Signed)
      History of Present Illness:  Cynthia Long has returned for follow-up of cirrhosis.  Over the past 2 weeks she has noted increasing abdominal girth and bloating.  Weight has increased.  She remains on Aldactone 100 mg daily and Lasix 20 g daily.    Review of Systems: Pertinent positive and negative review of systems were noted in the above HPI section. All other review of systems were otherwise negative.    Current Medications, Allergies, Past Medical History, Past Surgical History, Family History and Social History were reviewed in [facility] record  Vital signs were reviewed in today's medical record. Physical Exam: General: Well developed , well nourished, no acute distress Skin: anicteric Head: Normocephalic and atraumatic Eyes:  sclerae anicteric, EOMI Ears: Normal auditory acuity Mouth: No deformity or lesions Lungs: Clear throughout to auscultation Heart: Regular rate and rhythm; no murmurs, rubs or bruits Abdomen: Abdomen is protuberant with probable ascites  Rectal:deferred Musculoskeletal: Symmetrical with no gross deformities  Pulses:  Normal pulses noted Extremities: No clubbing, cyanosis, edema or deformities noted Neurological: Alert oriented x 4, grossly nonfocal Psychological:  Alert and cooperative. Normal mood and affect  See Assessment and Plan under Problem List

## 2014-02-17 ENCOUNTER — Ambulatory Visit: Payer: PRIVATE HEALTH INSURANCE | Admitting: Gastroenterology

## 2014-03-08 ENCOUNTER — Telehealth: Payer: Self-pay | Admitting: Gastroenterology

## 2014-03-08 NOTE — Telephone Encounter (Signed)
See the last office note. She is at the goal weight now. Any changes?

## 2014-03-09 MED ORDER — SPIRONOLACTONE 50 MG PO TABS: 50.0000 mg | ORAL_TABLET | Freq: Every day | ORAL | Status: DC

## 2014-03-09 NOTE — Telephone Encounter (Signed)
Patient notified of changes. New rx sent to pharmacy for Aldactone.

## 2014-03-09 NOTE — Telephone Encounter (Signed)
Decrease Aldactone to 50 mg daily and Lasix to 20 mg daily

## 2014-04-27 ENCOUNTER — Other Ambulatory Visit: Payer: Self-pay | Admitting: Gastroenterology

## 2014-06-13 ENCOUNTER — Ambulatory Visit (INDEPENDENT_AMBULATORY_CARE_PROVIDER_SITE_OTHER): Payer: PRIVATE HEALTH INSURANCE | Admitting: Gastroenterology

## 2014-06-13 ENCOUNTER — Encounter: Payer: Self-pay | Admitting: Gastroenterology

## 2014-06-13 VITALS — BP 164/60 | HR 88 | Ht 63.0 in | Wt 143.0 lb

## 2014-06-13 DIAGNOSIS — K7460 Unspecified cirrhosis of liver: Secondary | ICD-10-CM

## 2014-06-13 DIAGNOSIS — R188 Other ascites: Principal | ICD-10-CM

## 2014-06-13 NOTE — Patient Instructions (Signed)
In 6 months you will need a follow up Ultrasound and Labs  You will need to follow up in 6 months after labs and ultrasound are completed

## 2014-06-13 NOTE — Progress Notes (Signed)
      History of Present Illness:  Cynthia Long continues to feel well without any obvious sequela of cirrhosis except for ascites which is well-controlled with diuretics.  Workup for limited hematemesis in August including upper and lower endoscopy and capsule endoscopy were unrevealing.  No varices were seen.  She has portal hypertension and cryptogenic cirrhosis with CT demonstrating small perigastric varices and ascites.  Since her last visit she has felt well.    Review of Systems: Pertinent positive and negative review of systems were noted in the above HPI section. All other review of systems were otherwise negative.    Current Medications, Allergies, Past Medical History, Past Surgical History, Family History and Social History were reviewed in [facility] record  Vital signs were reviewed in today's medical record. Physical Exam: General: Well developed , well nourished, no acute distress Skin: anicteric Head: Normocephalic and atraumatic Eyes:  sclerae anicteric, EOMI Ears: Normal auditory acuity Mouth: No deformity or lesions Lungs: Clear throughout to auscultation Heart: Regular rate and rhythm; no rubs or bruits.  Is a 3-8/1 early systolic murmur Abdomen: Soft, non tender and non distended. No masses, hepatosplenomegaly or hernias noted. Normal Bowel sounds.  No obvious ascites Rectal:deferred Musculoskeletal: Symmetrical with no gross deformities  Pulses:  Normal pulses noted Extremities: No clubbing, cyanosis, edema or deformities noted Neurological: Alert oriented x 4, grossly nonfocal Psychological:  Alert and cooperative. Normal mood and affect  See Assessment and Plan under Problem List

## 2014-06-13 NOTE — Assessment & Plan Note (Signed)
Patient continues to do well on maintenance diuretics.  No evidence for varices by endoscopy 6 months ago.  Recommendations #1 follow-up endoscopy in 6 months #2 check ultrasound, alpha-fetoprotein, CBC and INR in 6 months

## 2014-06-24 ENCOUNTER — Telehealth: Payer: Self-pay

## 2014-06-24 NOTE — Telephone Encounter (Signed)
-----   Message from Inda Castle, MD sent at 06/24/2014 10:02 AM EDT ----- Please call Mrs. Forgy in response to her fax regarding a low platelet count.  Explain to her that the low platelet count is secondary to platelets that are taken up by the spleen.  This is all related to cirrhosis.  The platelets actually work well and deficiency at this point does not pose a threat of bleeding.  There is no specific therapy.

## 2014-06-24 NOTE — Telephone Encounter (Signed)
Spoke with the patient. She expresses understanding of this information. Her follow up appointment will be in October.

## 2014-07-01 ENCOUNTER — Other Ambulatory Visit: Payer: Self-pay | Admitting: Gastroenterology

## 2014-10-04 ENCOUNTER — Encounter: Payer: Self-pay | Admitting: Gastroenterology

## 2014-10-06 ENCOUNTER — Other Ambulatory Visit: Payer: Self-pay | Admitting: Gastroenterology

## 2014-10-25 ENCOUNTER — Other Ambulatory Visit: Payer: Self-pay | Admitting: Gastroenterology

## 2014-12-01 ENCOUNTER — Telehealth: Payer: Self-pay | Admitting: Gastroenterology

## 2014-12-01 NOTE — Telephone Encounter (Signed)
Needs final Hepatitis vaccine due date. She was recently hospitalized with an upper GI bleed in [place]. She is going to continue her GI care there.

## 2014-12-15 ENCOUNTER — Encounter: Payer: PRIVATE HEALTH INSURANCE | Admitting: Gastroenterology

## 2014-12-21 ENCOUNTER — Other Ambulatory Visit: Payer: Self-pay | Admitting: Gastroenterology

## 2015-01-13 ENCOUNTER — Other Ambulatory Visit: Payer: Self-pay | Admitting: Gastroenterology

## 2016-10-03 ENCOUNTER — Telehealth: Payer: Self-pay | Admitting: Internal Medicine

## 2016-10-03 NOTE — Telephone Encounter (Signed)
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  Please contact the patient and see if she can come see me tomorrow PM- if not arrange for week after next  Will confirm ok to call when I know they are aware

## 2016-10-08 NOTE — Telephone Encounter (Signed)
Per Dr Carlean Purl patient was offered appointment next week.  However she wants to wait until September b/c her new insurance starts in September.  She will bring the card with her.  I will get the girls up front to mail out new paperwork to give [place] updated information.

## 2016-10-08 NOTE — Telephone Encounter (Signed)
Would you please mail her out the new patient information packet.  She is a former Dr Deatra Ina patient returning to [place].  Thanks.  Her appointment is 11/14/16 at 10:15AM.

## 2016-10-09 ENCOUNTER — Encounter: Payer: Self-pay | Admitting: Internal Medicine

## 2016-11-14 ENCOUNTER — Other Ambulatory Visit (INDEPENDENT_AMBULATORY_CARE_PROVIDER_SITE_OTHER): Payer: Medicare Other

## 2016-11-14 ENCOUNTER — Ambulatory Visit (INDEPENDENT_AMBULATORY_CARE_PROVIDER_SITE_OTHER): Payer: Medicare Other | Admitting: Internal Medicine

## 2016-11-14 ENCOUNTER — Encounter: Payer: Self-pay | Admitting: Internal Medicine

## 2016-11-14 VITALS — BP 124/70 | HR 94 | Ht 62.0 in | Wt 132.0 lb

## 2016-11-14 DIAGNOSIS — E1100 Type 2 diabetes mellitus with hyperosmolarity without nonketotic hyperglycemic-hyperosmolar coma (NKHHC): Secondary | ICD-10-CM | POA: Diagnosis not present

## 2016-11-14 DIAGNOSIS — K7469 Other cirrhosis of liver: Secondary | ICD-10-CM

## 2016-11-14 DIAGNOSIS — K7460 Unspecified cirrhosis of liver: Secondary | ICD-10-CM

## 2016-11-14 DIAGNOSIS — R932 Abnormal findings on diagnostic imaging of liver and biliary tract: Secondary | ICD-10-CM

## 2016-11-14 DIAGNOSIS — R188 Other ascites: Secondary | ICD-10-CM | POA: Diagnosis not present

## 2016-11-14 LAB — COMPREHENSIVE METABOLIC PANEL
ALBUMIN: 3.7 g/dL (ref 3.5–5.2)
ALK PHOS: 296 U/L — AB (ref 39–117)
ALT: 34 U/L (ref 0–35)
AST: 30 U/L (ref 0–37)
BILIRUBIN TOTAL: 2.9 mg/dL — AB (ref 0.2–1.2)
BUN: 30 mg/dL — AB (ref 6–23)
CO2: 26 mEq/L (ref 19–32)
CREATININE: 1.59 mg/dL — AB (ref 0.40–1.20)
Calcium: 10 mg/dL (ref 8.4–10.5)
Chloride: 94 mEq/L — ABNORMAL LOW (ref 96–112)
GFR: 33.75 mL/min — ABNORMAL LOW (ref >60.00)
Glucose, Bld: 232 mg/dL — ABNORMAL HIGH (ref 70–99)
POTASSIUM: 4.6 meq/L (ref 3.5–5.1)
SODIUM: 131 meq/L — AB (ref 135–145)
TOTAL PROTEIN: 7.4 g/dL (ref 6.0–8.3)

## 2016-11-14 LAB — CBC WITH DIFFERENTIAL/PLATELET
Basophils Absolute: 0.1 10*3/uL (ref 0.0–0.1)
Basophils Relative: 0.7 % (ref 0.0–3.0)
Eosinophils Absolute: 0 10*3/uL (ref 0.0–0.7)
Eosinophils Relative: 0.1 % (ref 0.0–5.0)
HCT: 47.4 % — ABNORMAL HIGH (ref 36.0–46.0)
HEMOGLOBIN: 15.9 g/dL — AB (ref 12.0–15.0)
Lymphs Abs: 0.6 10*3/uL — ABNORMAL LOW (ref 0.7–4.0)
MCHC: 33.6 g/dL (ref 30.0–36.0)
MCV: 92.6 fl (ref 78.0–100.0)
MONOS PCT: 9.8 % (ref 3.0–12.0)
Monocytes Absolute: 0.8 10*3/uL (ref 0.1–1.0)
Neutro Abs: 7.1 10*3/uL (ref 1.4–7.7)
Neutrophils Relative %: 82.5 % — ABNORMAL HIGH (ref 43.0–77.0)
Platelets: 116 10*3/uL — ABNORMAL LOW (ref 150.0–400.0)
RBC: 5.11 Mil/uL (ref 3.87–5.11)
RDW: 14.7 % (ref 11.5–15.5)
WBC: 8.6 10*3/uL (ref 4.0–10.5)

## 2016-11-14 LAB — PROTIME-INR
INR: 1.2 ratio — AB (ref 0.8–1.0)
PROTHROMBIN TIME: 12.8 s (ref 9.6–13.1)

## 2016-11-14 LAB — HEMOGLOBIN A1C: Hgb A1c MFr Bld: 7.8 % — ABNORMAL HIGH (ref 4.6–6.5)

## 2016-11-14 LAB — AMMONIA: Ammonia: 38 umol/L — ABNORMAL HIGH (ref 11–35)

## 2016-11-14 NOTE — Patient Instructions (Addendum)
  Your physician has requested that you go to the basement for lab work before leaving today.   We are going to set up an echo cardiogram at [facility] for you.  We will contact you with the date/time. Patient informed that echo appointment set up for 11/27/16 at 2:00pm, arrive at 1:45pm. No prep. Check in the main entrance. Also left appointment details on daughter's voicemail.   We are giving you a written rx  to take to have labs done from your paracentesis.    Please get your Imaging studies to [place].      I appreciate the opportunity to care for you. Silvano Rusk, MD, [facility]

## 2016-11-14 NOTE — Progress Notes (Signed)
Cynthia Long 76 y.o. 1941/03/02 [id number]  Assessment & Plan:  Hepatic cirrhosis ([place]) Main issue now is ascites and inability to control with diuretics - leg cramps some hyponatremia  And feels awful after she has paracentesis x days. Labs today w/ elevated Hgb and BUN and creatinine. ? Intravascular depleted. I have since called and told Cynthia to hold diuretics. TIPS seems like an option and I reviewed the risks benefits and indications. She has ? Liver lesions on MR 06/2016 so need to see that and image discs requested. If suspicious for cancer that will be Cynthia problem. She also needs an echocardiogram. Good that she does not have encephalopathy but could get that w/ TIPS.   Child's B 9 points  Lab eval today w/ CBC, CMET, INR, AFP Echocardiogram Proceed w/ Paracentesis as planned for 9/17 Review MR from 4/18 IR consult if TIPS not contraindicated  Abnormal MRI, liver Need to see MRI to see what the "liver lesions" in [name] notes are  Uncontrolled type 2 diabetes mellitus , without long-term current use of insulin ([place]) Should be on insulin Will refer to endocrinology for input Lab Results  Component Value Date   HGBA1C 7.8 (H) 11/14/2016     Cc:Macon Large, MD  Subjective:   Chief Complaint: ascites and cirrhosis  HPI Cynthia Long, son and brother-in-law - has hx cirrhosis and ascites w/ hx non-variceal GI bleeding but no clear etiology - saw [name] in past and then [name] in [place]. Main issue has been ascites and she has had multiple paracenteses since late 2017. Spironolactone and furosemide used but doses decreasing due to cramps and mild renal insufficiency. She feels terrible for days after paracenteses - have taken off 7.7L max.  GI records 2017-2018 reviewed. Was working at [employer] until recently but did retire. She has no confusion but is having insomnia lately. + early satiety better after  BMET 9/4 Gluc 285 BUN 32  creat 1.64 Na 129  Records review shows that 4/18 MRI showed liver lesions but I do not have image report Allergies  Allergen Reactions  . Biaxin [Clarithromycin]     Liver enzymes elevated per pt  . Sulfa Antibiotics Diarrhea   Current Meds  Medication Sig  . budesonide (PULMICORT) 0.5 MG/2ML nebulizer solution Take 0.5 mg by nebulization daily as needed (for shortness of breath).  . calcium-vitamin D (OSCAL WITH D) 250-125 MG-UNIT per tablet Take 6 tablets by mouth daily.  . cromolyn (INTAL) 20 MG/2ML nebulizer solution Take 20 mg by nebulization daily as needed for wheezing.  . diltiazem (DILACOR XR) 120 MG 24 hr capsule Take 120 mg by mouth daily.  . furosemide (LASIX) 40 MG tablet 40 mg 2 (two) times daily.   Marland Kitchen glimepiride (AMARYL) 4 MG tablet Take 4 mg by mouth daily with breakfast.   . Lancets (ONETOUCH ULTRASOFT) lancets 1 each by Other route 2 (two) times daily.   Marland Kitchen PRESCRIPTION MEDICATION every Wednesday. Allergy Shots  . saxagliptin HCl (ONGLYZA) 5 MG TABS tablet Take 5 mg by mouth daily.  Marland Kitchen spironolactone (ALDACTONE) 50 MG tablet 50 mg once.   . [DISCONTINUED] furosemide (LASIX) 20 MG tablet Take 1 tablet (20 mg total) by mouth daily.  . [DISCONTINUED] furosemide (LASIX) 40 MG tablet TAKE 1 TABLET BY MOUTH EVERY DAY  . [DISCONTINUED] spironolactone (ALDACTONE) 100 MG tablet TAKE 1 TABLET BY MOUTH EVERY DAY  . [DISCONTINUED] spironolactone (ALDACTONE) 50 MG tablet Take 1 tablet (  50 mg total) by mouth daily.   Past Medical History:  Diagnosis Date  . Allergy   . Anemia   . Blood transfusion without reported diagnosis   . Bronchitis, acute   . Cholelithiasis   . 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 Diabetes mellitus without complication ([place])   . Dysrhythmia   . Esophageal varices ([place])   . Gastritis   . GI bleed 08/2013, 10/2013   EGD negative 08/2013.   Marland Kitchen Headache(784.0)   . Heart murmur   . Hypertension   . Obesity   . Pertussis 2011  . Tubular adenoma  of colon   . Uncontrolled type 2 diabetes mellitus , without long-term current use of insulin ([place]) 11/15/2016   Past Surgical History:  Procedure Laterality Date  . COLONOSCOPY W/ BIOPSIES    . ENTEROSCOPY N/Cynthia 10/11/2013   Procedure: ENTEROSCOPY;  Surgeon: Lafayette Dragon, MD;  Location: [facility] END[facility];  Service: Endoscopy;  Laterality: N/Cynthia;  . ESOPHAGOGASTRODUODENOSCOPY N/Cynthia 08/14/2013   Procedure: ESOPHAGOGASTRODUODENOSCOPY (EGD);  Surgeon: Inda Castle, MD;  Location: [facility];  Service: Endoscopy;  Laterality: N/Cynthia;  . ESOPHAGOGASTRODUODENOSCOPY N/Cynthia 10/08/2013   Procedure: ESOPHAGOGASTRODUODENOSCOPY (EGD);  Surgeon: [name], MD;  Location: [facility];  Service: Endoscopy;  Laterality: N/Cynthia;  . VAGINAL HYSTERECTOMY     partial, for prolapsed uterus.    Social History   Social History Narrative   Retired Human resources officer work   Divorced   1 son and 1 Long        family history includes Heart disease in Cynthia Long; Hyperlipidemia in Cynthia Long; Hypertension in Cynthia Long; Throat cancer in Cynthia Long.   Review of Systems Weak. No sig edema All other ROS negative  Objective:   Physical Exam @BP  124/70   Pulse 94   Ht 5\' 2"  (1.575 m)   Wt 132 lb (59.9 kg)   BMI 24.14 kg/m @  General:  Well-developed, well-nourished and in no acute distress Eyes:  anicteric. ENT:   Mouth and posterior pharynx free of lesions.  Neck:   supple w/o thyromegaly or mass.  Lungs: Clear to auscultation bilaterally. Heart:  S1S2, 2/6 sys murmur murmurs, gallops. + external jvd Abdomen:  massive ascites w/ hernia, prominent vvLymph:  no cervical or supraclavicular adenopathy. Extremities:   no edema, cyanosis or clubbing Skin   no rash. Neuro:  Cynthia&O x 3. No asterixis serial 7 no Psych:  appropriate mood and  Affect.   Data Reviewed: See HPI

## 2016-11-15 ENCOUNTER — Encounter: Payer: Self-pay | Admitting: Internal Medicine

## 2016-11-15 DIAGNOSIS — E1100 Type 2 diabetes mellitus with hyperosmolarity without nonketotic hyperglycemic-hyperosmolar coma (NKHHC): Secondary | ICD-10-CM

## 2016-11-15 DIAGNOSIS — R932 Abnormal findings on diagnostic imaging of liver and biliary tract: Secondary | ICD-10-CM | POA: Insufficient documentation

## 2016-11-15 HISTORY — DX: Type 2 diabetes mellitus with hyperosmolarity without nonketotic hyperglycemic-hyperosmolar coma (NKHHC): E11.00

## 2016-11-15 LAB — AFP TUMOR MARKER: AFP TUMOR MARKER: 6.1 ng/mL — AB

## 2016-11-15 NOTE — Assessment & Plan Note (Signed)
Should be on insulin Will refer to endocrinology for input Lab Results  Component Value Date   HGBA1C 7.8 (H) 11/14/2016

## 2016-11-15 NOTE — Assessment & Plan Note (Addendum)
Main issue now is ascites and inability to control with diuretics - leg cramps some hyponatremia  And feels awful after she has paracentesis x days. Labs today w/ elevated Hgb and BUN and creatinine. ? Intravascular depleted. I have since called and told her to hold diuretics. TIPS seems like an option and I reviewed the risks benefits and indications. She has ? Liver lesions on MR 06/2016 so need to see that and image discs requested. If suspicious for cancer that will be a problem. She also needs an echocardiogram. Good that she does not have encephalopathy but could get that w/ TIPS.   Child's B 9 points  Lab eval today w/ CBC, CMET, INR, AFP Echocardiogram Proceed w/ Paracentesis as planned for 9/17 Review MR from 4/18 IR consult if TIPS not contraindicated

## 2016-11-15 NOTE — Progress Notes (Signed)
Labs show no major changes but I think she is intravascular volume depleted - I.e. Dehydrated in blood stream if you will  I suggest she stop her diuretics over the weekend. She is having a paracentesis Monday and should not take the diuretics that day either and she can update me how she feels on Tuesday before restarting them  I suggest 5 L max paracentesis - she can tell them

## 2016-11-15 NOTE — Assessment & Plan Note (Signed)
Need to see MRI to see what the "liver lesions" in [name] notes are

## 2016-11-19 ENCOUNTER — Telehealth: Payer: Self-pay | Admitting: Internal Medicine

## 2016-11-19 DIAGNOSIS — R188 Other ascites: Secondary | ICD-10-CM

## 2016-11-19 NOTE — Telephone Encounter (Signed)
Is it ok for her to resume her diuretics?

## 2016-11-19 NOTE — Telephone Encounter (Signed)
Yes  40 mg furosemide and 50 mg spironolactone each in AM (different for her0 and start tomorrow  Does she know status of MR disc getting to me?

## 2016-11-19 NOTE — Telephone Encounter (Signed)
Patient notified of the new rx for diuretics She has not contacted the facility for the MR disk, but will reach out and do that

## 2016-11-21 ENCOUNTER — Other Ambulatory Visit: Payer: Self-pay

## 2016-11-21 DIAGNOSIS — R188 Other ascites: Secondary | ICD-10-CM

## 2016-11-21 NOTE — Telephone Encounter (Signed)
Waiting on the MR disc and echo (having at [facility] 9/26).  I suggest we schedule her for US paracentesis up to 5 L with albumin also - if she can do at [facility] that is great I think   No studies needed  Maybe do same day as echo??

## 2016-11-21 NOTE — Telephone Encounter (Signed)
Patient notified of paracentesis on 11/26/16 9:00 at [facility].  They could not do on 11/27/16.  Patient wanted you to know that she just had a paracentesis on Monday.  Are you aware of this?  Do you want her to repeat so soon?

## 2016-11-22 ENCOUNTER — Ambulatory Visit
Admission: RE | Admit: 2016-11-22 | Discharge: 2016-11-22 | Disposition: A | Discharge status: Home / Self Care | Payer: Self-pay | Source: Ambulatory Visit | Attending: Diagnostic Radiology | Admitting: Diagnostic Radiology

## 2016-11-22 ENCOUNTER — Other Ambulatory Visit (HOSPITAL_COMMUNITY): Payer: Self-pay | Admitting: Diagnostic Radiology

## 2016-11-22 DIAGNOSIS — C801 Malignant (primary) neoplasm, unspecified: Secondary | ICD-10-CM

## 2016-11-22 NOTE — Telephone Encounter (Signed)
I am/was aware  I was thinking she would be ready by then - if she thinks she can wait until the week of Oct 1 that is ok

## 2016-11-22 NOTE — Telephone Encounter (Signed)
Patient notified to keep the appt for 11/26/16

## 2016-11-26 ENCOUNTER — Ambulatory Visit (HOSPITAL_COMMUNITY)
Admission: RE | Admit: 2016-11-26 | Discharge: 2016-11-26 | Disposition: A | Discharge status: Home / Self Care | Payer: Medicare Other | Source: Ambulatory Visit | Attending: Internal Medicine | Admitting: Internal Medicine

## 2016-11-26 ENCOUNTER — Encounter (HOSPITAL_COMMUNITY): Payer: Self-pay

## 2016-11-26 ENCOUNTER — Other Ambulatory Visit: Payer: Self-pay

## 2016-11-26 DIAGNOSIS — R188 Other ascites: Secondary | ICD-10-CM | POA: Diagnosis present

## 2016-11-26 MED ORDER — ALBUMIN HUMAN 25 % IV SOLN
INTRAVENOUS | Status: AC
  Administered 2016-11-26: 12.5 g via INTRAVENOUS
  Filled 2016-11-26: qty 50

## 2016-11-26 MED ORDER — ALBUMIN HUMAN 25 % IV SOLN
12.5000 g | Freq: Once | INTRAVENOUS | Status: AC
  Administered 2016-11-26: 12.5 g via INTRAVENOUS

## 2016-11-26 NOTE — Procedures (Signed)
PreOperative Dx: Cirrhosis, ascites Postoperative Dx: Cirrhosis, ascites Procedure:   US guided paracentesis Radiologist:  Cassius Cullinane Anesthesia:  10 ml of1% lidocaine Specimen:  5 L of yellow ascitic fluid EBL:   < 1 ml Complications: None  

## 2016-11-26 NOTE — Progress Notes (Signed)
Paracentesis complete no signs of distress; 5L yellow colored ascites removed.  

## 2016-11-27 ENCOUNTER — Ambulatory Visit (HOSPITAL_COMMUNITY): Payer: Medicare Other

## 2016-11-28 ENCOUNTER — Ambulatory Visit (HOSPITAL_COMMUNITY)
Admission: RE | Admit: 2016-11-28 | Discharge: 2016-11-28 | Disposition: A | Discharge status: Home / Self Care | Payer: Medicare Other | Source: Ambulatory Visit | Attending: Internal Medicine | Admitting: Internal Medicine

## 2016-11-28 DIAGNOSIS — R188 Other ascites: Secondary | ICD-10-CM | POA: Insufficient documentation

## 2016-11-28 DIAGNOSIS — E119 Type 2 diabetes mellitus without complications: Secondary | ICD-10-CM | POA: Insufficient documentation

## 2016-11-28 DIAGNOSIS — I119 Hypertensive heart disease without heart failure: Secondary | ICD-10-CM | POA: Diagnosis not present

## 2016-11-28 DIAGNOSIS — I083 Combined rheumatic disorders of mitral, aortic and tricuspid valves: Secondary | ICD-10-CM | POA: Diagnosis not present

## 2016-11-28 DIAGNOSIS — Z87891 Personal history of nicotine dependence: Secondary | ICD-10-CM | POA: Insufficient documentation

## 2016-11-28 NOTE — Progress Notes (Signed)
*  PRELIMINARY RESULTS* Echocardiogram 2D Echocardiogram has been performed.  Leavy Cella 11/28/2016, 3:03 PM

## 2016-12-03 ENCOUNTER — Telehealth: Payer: Self-pay | Admitting: Internal Medicine

## 2016-12-03 ENCOUNTER — Other Ambulatory Visit: Payer: Self-pay

## 2016-12-03 DIAGNOSIS — R188 Other ascites: Secondary | ICD-10-CM

## 2016-12-03 NOTE — Telephone Encounter (Signed)
Schedule another paracentesis at [facility] up to 5 L Albumin infusion  Cell count and diff please  Let her know Echo was ok' Does she have an IR appt?

## 2016-12-03 NOTE — Telephone Encounter (Signed)
Patient notified of recommendations She will see IR 12/11/16.  Appt was arranged directly with the patient by GI

## 2016-12-03 NOTE — Telephone Encounter (Signed)
Consult w/ IR re: TIPS?  Dx ascites

## 2016-12-03 NOTE — Telephone Encounter (Signed)
Paracentesis has been scheduled at [facility] for 12/05/16 8:00.  She is instructed to arrive at 7:45 She is not aware of any need for appt with IR.  Please advise

## 2016-12-03 NOTE — Telephone Encounter (Signed)
Patient feels she needs another paracentesis.  Last was a week ago today.  She reports that she is taking spironolactone 50 mg daily and furosemide 40 mg daily.  Her medication list says furosemide is 80 mg daily.  Please clarify correct dose.  OK to schedule paracentesis? She has not been weighing herself.  She is advised that she should be doing this on a daily basis and recording her weights.

## 2016-12-03 NOTE — Telephone Encounter (Signed)
IR appt for what? I don't see anything about an IR appt.

## 2016-12-05 ENCOUNTER — Encounter (HOSPITAL_COMMUNITY): Payer: Self-pay

## 2016-12-05 ENCOUNTER — Ambulatory Visit (HOSPITAL_COMMUNITY)
Admission: RE | Admit: 2016-12-05 | Discharge: 2016-12-05 | Disposition: A | Discharge status: Home / Self Care | Payer: Medicare Other | Source: Ambulatory Visit | Attending: Internal Medicine | Admitting: Internal Medicine

## 2016-12-05 DIAGNOSIS — R188 Other ascites: Secondary | ICD-10-CM

## 2016-12-05 LAB — BODY FLUID CELL COUNT WITH DIFFERENTIAL
Eos, Fluid: 0 %
LYMPHS FL: 52 %
Monocyte-Macrophage-Serous Fluid: 37 % — ABNORMAL LOW (ref 50–90)
NEUTROPHIL FLUID: 11 % (ref 0–25)
OTHER CELLS FL: 0 %
WBC FLUID: 169 uL (ref 0–1000)

## 2016-12-05 MED ORDER — ALBUMIN HUMAN 25 % IV SOLN
INTRAVENOUS | Status: AC
  Administered 2016-12-05: 50 g via INTRAVENOUS
  Filled 2016-12-05: qty 200

## 2016-12-05 MED ORDER — SODIUM CHLORIDE 0.9% FLUSH
INTRAVENOUS | Status: AC
  Filled 2016-12-05: qty 10

## 2016-12-05 MED ORDER — ALBUMIN HUMAN 25 % IV SOLN
50.0000 g | Freq: Once | INTRAVENOUS | Status: AC
  Administered 2016-12-05: 50 g via INTRAVENOUS

## 2016-12-05 NOTE — Progress Notes (Signed)
Paracentesis complete no signs of distress, 5L yellow colored ascites  removed.

## 2016-12-05 NOTE — Procedures (Signed)
  US guided LLQ paracentesis  5 Liters yellow fluid obtained Sent for labs per MD  Tolerated well

## 2016-12-06 LAB — PATHOLOGIST SMEAR REVIEW

## 2016-12-11 ENCOUNTER — Ambulatory Visit
Admission: RE | Admit: 2016-12-11 | Discharge: 2016-12-11 | Disposition: A | Discharge status: Home / Self Care | Payer: Medicare Other | Source: Ambulatory Visit | Attending: Internal Medicine | Admitting: Internal Medicine

## 2016-12-11 ENCOUNTER — Other Ambulatory Visit: Payer: Self-pay | Admitting: *Deleted

## 2016-12-11 ENCOUNTER — Other Ambulatory Visit (HOSPITAL_COMMUNITY): Payer: Self-pay | Admitting: Interventional Radiology

## 2016-12-11 DIAGNOSIS — R188 Other ascites: Secondary | ICD-10-CM

## 2016-12-11 HISTORY — PX: IR RADIOLOGIST EVAL & MGMT: IMG5224

## 2016-12-11 NOTE — Consult Note (Signed)
Chief Complaint: Patient was seen in consultation today for  Chief Complaint  Patient presents with  . Advice Only    Consult for TIPS    at the request of Gessner,Carl E  Referring Physician(s): Gessner,Carl E  History of Present Illness: Cynthia Long is a 76 y.o. female with a history of NASH cirrhosis , complicated by recurrent large volume symptomatic ascites. Her ascites has been resistant to diuretic therapy. She has suffered from leg cramps, hyponatremia and worsening renal function. Her diuretic dose was recently decreased.  She has undergone frequent paracentesis. She states that she feels awful for several days following each paracentesis. She typically has 5 L removed per session. Her last paracentesis was on 12/05/2016. She feels that her fluid has already read accumulated.  She denies current fever, chills, chest pain, nausea or vomiting. Her appetite is average. She denies ever having experienced symptoms of encephalopathy. No confusion, clumsiness or other encephalopathic symptoms. She has no known cardiac history and recently had an echocardiogram performed.  Past Medical History:  Diagnosis Date  . Allergy   . Anemia   . Blood transfusion without reported diagnosis   . Bronchitis, acute   . Cholelithiasis   . Cirrhosis ([place]) 08/2013   likely from NASH  . Diabetes mellitus without complication ([place])   . Dysrhythmia   . Esophageal varices ([place])   . Gastritis   . GI bleed 08/2013, 10/2013   EGD negative 08/2013.   Marland Kitchen Headache(784.0)   . Heart murmur   . Hypertension   . Obesity   . Pertussis 2011  . Tubular adenoma of colon   . Uncontrolled type 2 diabetes mellitus , without long-term current use of insulin ([place]) 11/15/2016    Past Surgical History:  Procedure Laterality Date  . COLONOSCOPY W/ BIOPSIES    . ENTEROSCOPY N/A 10/11/2013   Procedure: ENTEROSCOPY;  Surgeon: Lafayette Dragon, MD;  Location: [facility] ENDOSCOPY;  Service: Endoscopy;  Laterality: N/A;  .  ESOPHAGOGASTRODUODENOSCOPY N/A 08/14/2013   Procedure: ESOPHAGOGASTRODUODENOSCOPY (EGD);  Surgeon: Inda Castle, MD;  Location: [facility];  Service: Endoscopy;  Laterality: N/A;  . ESOPHAGOGASTRODUODENOSCOPY N/A 10/08/2013   Procedure: ESOPHAGOGASTRODUODENOSCOPY (EGD);  Surgeon: [name], MD;  Location: [facility];  Service: Endoscopy;  Laterality: N/A;  . VAGINAL HYSTERECTOMY     partial, for prolapsed uterus.    Allergies: Biaxin [clarithromycin] and Sulfa antibiotics  Medications: Prior to Admission medications   Medication Sig Start Date End Date Taking? Authorizing Provider  calcium-vitamin D (OSCAL WITH D) 250-125 MG-UNIT per tablet Take 6 tablets by mouth daily.   Yes [provider]  diltiazem (DILACOR XR) 120 MG 24 hr capsule Take 120 mg by mouth daily.   Yes [provider]  furosemide (LASIX) 40 MG tablet 40 mg 2 (two) times daily.  11/11/16  Yes [provider]  glimepiride (AMARYL) 4 MG tablet Take 4 mg by mouth daily with breakfast.    Yes [provider]  Lancets (ONETOUCH ULTRASOFT) lancets 1 each by Other route 2 (two) times daily.  04/23/14  Yes [provider]  Big Pool every Wednesday. Allergy Shots   Yes [provider]  saxagliptin HCl (ONGLYZA) 5 MG TABS tablet Take 5 mg by mouth daily.   Yes [provider]  spironolactone (ALDACTONE) 50 MG tablet 50 mg once.  11/11/16  Yes [provider]  budesonide (PULMICORT) 0.5 MG/2ML nebulizer solution Take 0.5 mg by nebulization daily as needed (for shortness of  breath).    [provider]  cromolyn (INTAL) 20 MG/2ML nebulizer solution Take 20 mg by nebulization daily as needed for wheezing.    [provider]     Family History  Problem Relation Age of Onset  . Heart disease Mother   . Hypertension Mother   . Hyperlipidemia Mother   . Throat cancer Father   . Stomach cancer Neg Hx   . Colon cancer Neg Hx      Social History   Social History  . Marital status: Single    Spouse name: N/A  . Number of children: 2  . Years of education: N/A   Occupational History  . retired [place] Topics  . Smoking status: Former Smoker    Types: Cigarettes    Quit date: 10/08/1961  . Smokeless tobacco: Never Used  . Alcohol use No  . Drug use: No  . Sexual activity: Not Currently    Partners: Male   Other Topics Concern  . Not on file   Social History Narrative   Retired Human resources officer work   Divorced   1 son and 1 daughter        Review of Systems: A 12 point ROS discussed and pertinent positives are indicated in the HPI above.  All other systems are negative.  Review of Systems  Vital Signs: BP (!) 157/57   Pulse 94   Temp 98.1 F (36.7 C) (Oral)   Resp (!) 100   Ht 5\' 2"  (1.575 m)   Wt 131 lb (59.4 kg)   SpO2 100%   BMI 23.96 kg/m   Physical Exam  Constitutional: She is oriented to person, place, and time. She appears well-developed and well-nourished. No distress.  HENT:  Head: Normocephalic and atraumatic.  Eyes: No scleral icterus.  Cardiovascular: Regular rhythm.   Pulmonary/Chest: Effort normal and breath sounds normal.  Abdominal: She exhibits distension. There is no tenderness. There is no rebound and no guarding.  Neurological: She is alert and oriented to person, place, and time.  Skin: Skin is warm and dry.  Psychiatric: She has a normal mood and affect. Her behavior is normal.  Nursing note and vitals reviewed.   Imaging: US Paracentesis  Result Date: 12/05/2016 INDICATION: Recurrent ascites EXAM: ULTRASOUND-GUIDED PARACENTESIS COMPARISON:  Previous paracentesis MEDICATIONS: 10 cc 1% lidocaine. COMPLICATIONS: None immediate. TECHNIQUE: Informed written consent was obtained from the patient after a discussion of the risks, benefits and alternatives to treatment. A timeout was performed prior to the initiation of the  procedure. Initial ultrasound scanning demonstrates a large amount of ascites within the left lower abdominal quadrant. The left lower abdomen was prepped and draped in the usual sterile fashion. 1% lidocaine with epinephrine was used for local anesthesia. Under direct ultrasound guidance, a 19 gauge, 7-cm, Yueh catheter was introduced. An ultrasound image was saved for documentation purposed. The paracentesis was performed. The catheter was removed and a dressing was applied. The patient tolerated the procedure well without immediate post procedural complication. FINDINGS: A total of approximately 5 liters of yellow fluid was removed. Samples were sent to the laboratory as requested by the clinical team. IMPRESSION: Successful ultrasound-guided paracentesis yielding 5 liters of peritoneal fluid. Read by Lavonia [facility] Electronically Signed   By: Monte Fantasia M.D.   On: 12/05/2016 09:47   US Paracentesis  Result Date: 11/26/2016 INDICATION: Ascites EXAM: ULTRASOUND GUIDED THERAPEUTIC PARACENTESIS MEDICATIONS: None. COMPLICATIONS: None immediate. PROCEDURE: Procedure, benefits,  and risks of procedure were discussed with patient. Written informed consent for procedure was obtained. Time out protocol followed. Adequate collection of ascites localized by ultrasound in LEFT lower quadrant. Skin prepped and draped in usual sterile fashion. Skin and soft tissues anesthetized with 10 mL of 1% lidocaine. 5 [place] Yueh catheter placed into peritoneal cavity. 5 L of yellow ascitic fluid aspirated by vacuum bottle suction. Procedure tolerated well by patient without immediate complication. FINDINGS: As above IMPRESSION: Successful ultrasound-guided paracentesis yielding 5 liters of peritoneal fluid. Electronically Signed   By: Lavonia Dana M.D.   On: 11/26/2016 11:17    Labs:  CBC:  Recent Labs  11/14/16 1144  WBC 8.6  HGB 15.9*  HCT 47.4*  PLT 116.0*    COAGS:  Recent Labs  11/14/16 1144  INR  1.2*    BMP:  Recent Labs  11/14/16 1144  NA 131*  K 4.6  CL 94*  CO2 26  GLUCOSE 232*  BUN 30*  CALCIUM 10.0  CREATININE 1.59*    LIVER FUNCTION TESTS:  Recent Labs  11/14/16 1144  BILITOT 2.9*  AST 30  ALT 34  ALKPHOS 296*  PROT 7.4  ALBUMIN 3.7    TUMOR MARKERS:  Recent Labs  11/14/16 1144  AFPTM 6.1*    Assessment and Plan:  Mrs. Anderegg appears to be an excellent candidate for creation of a transjugular intrahepatic portosystemic shunt. Her recent echocardiogram demonstrates strong right and left heart function, she has at very low risk for post TIPS cardiac complications. She has never struggled with encephalopathy and is therefore at very low risk for encephalopathy. Her current meld score is 17 which is in the intermediate risk range. Typically, we do not perform elective TIPS when a meld score is 20 or higher. However, her TIPS is driven partly by her current renal function which has increased with her diuretic usage. Now that her diuretic dose has been reduced, if her renal function has improved to normal, her meld score would come down to 12 which is low risk.    She is here today with her 2 sons. I explained the TIPS procedure as well as an alternate option of a [place] shunt in great detail. We drew diagrams and watched a computer animation.  We discussed the risks including post TIPS right-sided heart dysfunction, hepatic encephalopathy, hepatic decompensation and failure, bleeding, infection and death. I answered all of their questions fully. They voiced good understanding of the procedure and its associated risks and benefits. They understand that if her MELD continues to worsen that a [place] shunt would be an option.  At this time, they would like to proceed with TIPS creation.  1.) Draw liver labs (CMP, CBC, INR) today to recalculate her current meld. 2.) Abdominal MRI with Eovist contrast. It is been 6 months since her last screening examination. This  will provide a thorough screening for hepatocellular cancer as well as confirm patency and anatomy of the portal venous structures. 3.) Schedule for outpatient TIPS to be performed at [facility] in the next few weeks. Patient will require overnight admission and general anesthesia.   Thank you for this interesting consult.  I greatly enjoyed meeting Paetyn Pietrzak and look forward to participating in their care.  A copy of this report was sent to the requesting provider on this date.  Electronically Signed: [place], [place] 12/11/2016, 11:08 AM   I spent a total of  60 Minutes in face to face in clinical consultation, greater  than 50% of which was counseling/coordinating care for NASH cirrhosis complicated by large volume ascites.

## 2016-12-12 ENCOUNTER — Telehealth: Payer: Self-pay | Admitting: Internal Medicine

## 2016-12-12 ENCOUNTER — Other Ambulatory Visit: Payer: Self-pay

## 2016-12-12 DIAGNOSIS — R188 Other ascites: Secondary | ICD-10-CM

## 2016-12-12 LAB — COMPLETE METABOLIC PANEL WITH GFR
AG Ratio: 1.2 (calc) (ref 1.0–2.5)
ALKALINE PHOSPHATASE (APISO): 236 U/L — AB (ref 33–130)
ALT: 22 U/L (ref 6–29)
AST: 27 U/L (ref 10–35)
Albumin: 3.5 g/dL — ABNORMAL LOW (ref 3.6–5.1)
BILIRUBIN TOTAL: 2.3 mg/dL — AB (ref 0.2–1.2)
BUN/Creatinine Ratio: 19 (calc) (ref 6–22)
BUN: 25 mg/dL (ref 7–25)
CHLORIDE: 96 mmol/L — AB (ref 98–110)
CO2: 22 mmol/L (ref 20–32)
CREATININE: 1.35 mg/dL — AB (ref 0.60–0.93)
Calcium: 9.2 mg/dL (ref 8.6–10.4)
GFR, Est African American: 45 mL/min/{1.73_m2} — ABNORMAL LOW (ref >=60)
GFR, Est Non African American: 39 mL/min/{1.73_m2} — ABNORMAL LOW (ref >=60)
GLUCOSE: 153 mg/dL — AB (ref 65–99)
Globulin: 3 g/dL (calc) (ref 1.9–3.7)
Potassium: 4 mmol/L (ref 3.5–5.3)
Sodium: 133 mmol/L — ABNORMAL LOW (ref 135–146)
Total Protein: 6.5 g/dL (ref 6.1–8.1)

## 2016-12-12 LAB — CBC
HEMATOCRIT: 42.5 % (ref 35.0–45.0)
HEMOGLOBIN: 14.5 g/dL (ref 11.7–15.5)
MCH: 30.9 pg (ref 27.0–33.0)
MCHC: 34.1 g/dL (ref 32.0–36.0)
MCV: 90.6 fL (ref 80.0–100.0)
MPV: 11.3 fL (ref 7.5–12.5)
Platelets: 116 10*3/uL — ABNORMAL LOW (ref 140–400)
RBC: 4.69 10*6/uL (ref 3.80–5.10)
RDW: 13.2 % (ref 11.0–15.0)
WBC: 6.8 10*3/uL (ref 3.8–10.8)

## 2016-12-12 LAB — PROTIME-INR
INR: 1
PROTHROMBIN TIME: 11 s (ref 9.0–11.5)

## 2016-12-12 NOTE — Telephone Encounter (Signed)
Patient reports that she needs another paracentesis.  She has been 8 lbs. Since her last paracentesis 12/05/16.  She is scheduled for MRI next Tuesday to complete workup for TIPS.  She is asking if she could have a paracentesis that day.  If ok please advise how many liters and see the consult notes from radiology consult under notes.

## 2016-12-12 NOTE — Telephone Encounter (Signed)
Patient notified that Paracentesis will follow her MRI next week at 11:00

## 2016-12-12 NOTE — Telephone Encounter (Signed)
OK to do another paracentesis  She has not tolerated high volumes so would do 5 L max  Also give albumin  Also do   Cell count + diff Albumin Protein  Thanks

## 2016-12-16 ENCOUNTER — Telehealth (HOSPITAL_COMMUNITY): Payer: Self-pay

## 2016-12-16 NOTE — Telephone Encounter (Signed)
Called to schedule tips. Tried to schedule for this Thursday the 18th. Pt does not think she will be able to do that day. She will call me back if she is able to. She wants me to let her know what the next available day is for procedure. AW

## 2016-12-17 ENCOUNTER — Ambulatory Visit (HOSPITAL_COMMUNITY)
Admission: RE | Admit: 2016-12-17 | Discharge: 2016-12-17 | Disposition: A | Discharge status: Home / Self Care | Payer: Medicare Other | Source: Ambulatory Visit | Attending: Internal Medicine | Admitting: Internal Medicine

## 2016-12-17 ENCOUNTER — Ambulatory Visit (HOSPITAL_COMMUNITY)
Admission: RE | Admit: 2016-12-17 | Discharge: 2016-12-17 | Disposition: A | Discharge status: Home / Self Care | Payer: Medicare Other | Source: Ambulatory Visit | Attending: Interventional Radiology | Admitting: Interventional Radiology

## 2016-12-17 DIAGNOSIS — K8020 Calculus of gallbladder without cholecystitis without obstruction: Secondary | ICD-10-CM | POA: Diagnosis not present

## 2016-12-17 DIAGNOSIS — K7460 Unspecified cirrhosis of liver: Secondary | ICD-10-CM | POA: Diagnosis not present

## 2016-12-17 DIAGNOSIS — R188 Other ascites: Secondary | ICD-10-CM

## 2016-12-17 DIAGNOSIS — R161 Splenomegaly, not elsewhere classified: Secondary | ICD-10-CM | POA: Insufficient documentation

## 2016-12-17 LAB — GRAM STAIN

## 2016-12-17 LAB — ALBUMIN, PLEURAL OR PERITONEAL FLUID: Albumin, Fluid: <1 g/dL

## 2016-12-17 LAB — PROTEIN, PLEURAL OR PERITONEAL FLUID

## 2016-12-17 MED ORDER — ALBUMIN HUMAN 5 % IV SOLN
50.0000 g | Freq: Once | INTRAVENOUS | Status: DC
  Filled 2016-12-17: qty 1000

## 2016-12-17 MED ORDER — GADOXETATE DISODIUM 0.25 MMOL/ML IV SOLN
6.0000 mL | Freq: Once | INTRAVENOUS | Status: AC | PRN
  Administered 2016-12-17: 6 mL via INTRAVENOUS

## 2016-12-17 MED ORDER — ALBUMIN HUMAN 25 % IV SOLN
50.0000 g | Freq: Once | INTRAVENOUS | Status: AC
  Administered 2016-12-17: 50 g via INTRAVENOUS

## 2016-12-17 MED ORDER — ALBUMIN HUMAN 25 % IV SOLN
INTRAVENOUS | Status: AC
  Filled 2016-12-17: qty 200

## 2016-12-17 NOTE — Procedures (Signed)
PreOperative Dx: Cirrhosis due to NASH, ascites Postoperative Dx: Cirrhosis due to NASH, ascites Procedure:   US guided paracentesis Radiologist:  Anjel Perfetti Anesthesia:  10 ml of1% lidocaine Specimen:  5.2 L of yellow ascitic fluid EBL:   < 1 ml Complications: None  

## 2016-12-20 NOTE — Progress Notes (Signed)
Ascites tests are ok  Does she know when she will get TIPS?

## 2016-12-22 LAB — CULTURE, BODY FLUID W GRAM STAIN -BOTTLE: Culture: NO GROWTH

## 2016-12-22 LAB — CULTURE, BODY FLUID-BOTTLE

## 2016-12-24 ENCOUNTER — Other Ambulatory Visit (HOSPITAL_COMMUNITY): Payer: Self-pay | Admitting: Interventional Radiology

## 2016-12-24 DIAGNOSIS — R188 Other ascites: Secondary | ICD-10-CM

## 2016-12-26 ENCOUNTER — Other Ambulatory Visit: Payer: Self-pay

## 2016-12-26 DIAGNOSIS — R188 Other ascites: Secondary | ICD-10-CM

## 2016-12-26 NOTE — Progress Notes (Signed)
Looks like she is on for 11/5 TIPS I am thinking she will need another paracentesis w/ albumin before then. When you have a chance ask her about scheduling one Cell count and diff and cx please

## 2017-01-01 ENCOUNTER — Other Ambulatory Visit: Payer: Self-pay | Admitting: Student

## 2017-01-01 ENCOUNTER — Encounter: Payer: Self-pay | Admitting: Interventional Radiology

## 2017-01-01 NOTE — Pre-Procedure Instructions (Signed)
Cynthia Long  01/01/2017      [id number] [place], [place] - [address] [address] Phone: [phone] Fax: [phone]    Your procedure is scheduled on Monday, January 06, 2017  Report to [facility] Admitting Entrance "A" at 9:50A.M.   Call this number if you have problems the morning of surgery:  [phone]   Remember:  Do not eat food or drink liquids after midnight.  Take these medicines the morning of surgery with A SIP OF WATER: Diltiazem (DILACOR XR) and Omeprazole (PRILOSEC). If needed Acetaminophen (TYLENOL) for pain, Budesonide (PULMICORT) nebulizer for cough or wheezing, and VENTOLIN HFA inhaler for cough or wheezing (bring with you the day of surgery).  As of today, stop taking all Aspirins, Vitamins, Fish oils, and Herbal medications. Also stop all NSAIDS i.e. Advil, Ibuprofen, Motrin, Aleve, Anaprox, Naproxen, BC and Goody Powders.  How to Manage Your Diabetes Before and After Surgery  Why is it important to control my blood sugar before and after surgery? . Improving blood sugar levels before and after surgery helps healing and can limit problems. . A way of improving blood sugar control is eating a healthy diet by: o  Eating less sugar and carbohydrates o  Increasing activity/exercise o  Talking with your doctor about reaching your blood sugar goals . High blood sugars (greater than 180 mg/dL) can raise your risk of infections and slow your recovery, so you will need to focus on controlling your diabetes during the weeks before surgery. . Make sure that the doctor who takes care of your diabetes knows about your planned surgery including the date and location.  How do I manage my blood sugar before surgery? . Check your blood sugar at least 4 times a day, starting 2 days before surgery, to make sure that the level is not too high or low. o Check your blood sugar the morning of  your surgery when you wake up and every 2 hours until you get to the Short Stay unit. . If your blood sugar is less than 70 mg/dL, you will need to treat for low blood sugar: o Do not take insulin. o Treat a low blood sugar (less than 70 mg/dL) with  cup of clear juice (cranberry or apple), 4 glucose tablets, OR glucose gel. Recheck blood sugar in 15 minutes after treatment (to make sure it is greater than 70 mg/dL). If your blood sugar is not greater than 70 mg/dL on recheck, call [phone] o  for further instructions. . Report your blood sugar to the short stay nurse when you get to Short Stay.  . If you are admitted to the hospital after surgery: o Your blood sugar will be checked by the staff and you will probably be given insulin after surgery (instead of oral diabetes medicines) to make sure you have good blood sugar levels. o The goal for blood sugar control after surgery is 80-180 mg/dL.  WHAT DO I DO ABOUT MY DIABETES MEDICATION?  Marland Kitchen Do not take Glimepiride (AMARYL) and Saxagliptin HCl (ONGLYZA) the morning of surgery.  . If your CBG is greater than 220 mg/dL, call us at [phone]   Do not wear jewelry, make-up or nail polish.  Do not wear lotions, powders, perfumes, or deodorant.  Do not shave 48 hours prior to surgery.    Do not bring valuables to the hospital.  [facility] is not responsible  for any belongings or valuables.  Contacts, dentures or bridgework may not be worn into surgery.  Leave your suitcase in the car.  After surgery it may be brought to your room.  For patients admitted to the hospital, discharge time will be determined by your treatment team.  Patients discharged the day of surgery will not be allowed to drive home.   Special instructions:    [place]- Preparing For Surgery  Before surgery, you can play an important role. Because skin is not sterile, your skin needs to be as free of germs as possible. You can reduce the number of germs on your  skin by washing with CHG (chlorahexidine gluconate) Soap before surgery.  CHG is an antiseptic cleaner which kills germs and bonds with the skin to continue killing germs even after washing.  Please do not use if you have an allergy to CHG or antibacterial soaps. If your skin becomes reddened/irritated stop using the CHG.  Do not shave (including legs and underarms) for at least 48 hours prior to first CHG shower. It is OK to shave your face.  Please follow these instructions carefully.   1. Shower the NIGHT BEFORE SURGERY and the MORNING OF SURGERY with CHG.   2. If you chose to wash your hair, wash your hair first as usual with your normal shampoo.  3. After you shampoo, rinse your hair and body thoroughly to remove the shampoo.  4. Use CHG as you would any other liquid soap. You can apply CHG directly to the skin and wash gently with a scrungie or a clean washcloth.   5. Apply the CHG Soap to your body ONLY FROM THE NECK DOWN.  Do not use on open wounds or open sores. Avoid contact with your eyes, ears, mouth and genitals (private parts). Wash Face and genitals (private parts)  with your normal soap.  6. Wash thoroughly, paying special attention to the area where your surgery will be performed.  7. Thoroughly rinse your body with warm water from the neck down.  8. DO NOT shower/wash with your normal soap after using and rinsing off the CHG Soap.  9. Pat yourself dry with a CLEAN TOWEL.  10. Wear CLEAN PAJAMAS to bed the night before surgery, wear comfortable clothes the morning of surgery  11. Place CLEAN SHEETS on your bed the night of your first shower and DO NOT SLEEP WITH PETS.  Day of Surgery: Do not apply any deodorants/lotions. Please wear clean clothes to the hospital/surgery center.    Please read over the following fact sheets that you were given. Pain Booklet, Coughing and Deep Breathing and Surgical Site Infection Prevention

## 2017-01-02 ENCOUNTER — Encounter (HOSPITAL_COMMUNITY)
Admission: RE | Admit: 2017-01-02 | Discharge: 2017-01-02 | Disposition: A | Discharge status: Home / Self Care | Payer: Medicare Other | Source: Ambulatory Visit | Attending: Interventional Radiology | Admitting: Interventional Radiology

## 2017-01-02 ENCOUNTER — Ambulatory Visit (HOSPITAL_COMMUNITY)
Admission: RE | Admit: 2017-01-02 | Discharge: 2017-01-02 | Disposition: A | Discharge status: Home / Self Care | Payer: Medicare Other | Source: Ambulatory Visit | Attending: Internal Medicine | Admitting: Internal Medicine

## 2017-01-02 ENCOUNTER — Encounter (HOSPITAL_COMMUNITY): Payer: Self-pay | Admitting: Physician Assistant

## 2017-01-02 DIAGNOSIS — R188 Other ascites: Secondary | ICD-10-CM | POA: Insufficient documentation

## 2017-01-02 DIAGNOSIS — K7460 Unspecified cirrhosis of liver: Secondary | ICD-10-CM | POA: Insufficient documentation

## 2017-01-02 HISTORY — DX: Dyspnea, unspecified: R06.00

## 2017-01-02 HISTORY — DX: Acute kidney failure, unspecified: N17.9

## 2017-01-02 HISTORY — PX: IR PARACENTESIS: IMG2679

## 2017-01-02 LAB — BODY FLUID CELL COUNT WITH DIFFERENTIAL
Eos, Fluid: 0 %
Lymphs, Fluid: 60 %
Monocyte-Macrophage-Serous Fluid: 36 % — ABNORMAL LOW (ref 50–90)
Neutrophil Count, Fluid: 4 % (ref 0–25)
Total Nucleated Cell Count, Fluid: 168 cu mm (ref 0–1000)

## 2017-01-02 LAB — GLUCOSE, CAPILLARY: Glucose-Capillary: 153 mg/dL — ABNORMAL HIGH (ref 65–99)

## 2017-01-02 LAB — GRAM STAIN

## 2017-01-02 MED ORDER — LIDOCAINE 2% (20 MG/ML) 5 ML SYRINGE
INTRAMUSCULAR | Status: AC
  Filled 2017-01-02: qty 10

## 2017-01-02 MED ORDER — LIDOCAINE HCL (PF) 1 % IJ SOLN
INTRAMUSCULAR | Status: DC | PRN
  Administered 2017-01-02: 10 mL

## 2017-01-02 NOTE — Progress Notes (Addendum)
Cynthia Long denies chest pain or shortness of breath at this time- patient  had 6 liters removed through a paracentesis, this am.  Patients cardiologist, Dr Genene Churn at [facility] serves as her PCP also.  Patient thinks she had an EKG at her last office visit, I faxed a request for EKG and last office note.  Irregular Heart Beat is listed in patient's history, she does not know anything about it and denies any palpations. Cynthia Cynthia Long reports that fasting CBGs run 100-110. Last A1C was 7.8, drawn 11/14/16, prior to a radiology appt.

## 2017-01-02 NOTE — Procedures (Signed)
PROCEDURE SUMMARY:  Successful US guided paracentesis from left lateral abdomen.  Yielded 6 of clear yellow fluid.  No immediate complications.  Pt tolerated well.   Specimen was sent for labs.  Mildreth Reek S Linford Quintela PA-C 01/02/2017 10:44 AM

## 2017-01-03 ENCOUNTER — Other Ambulatory Visit: Payer: Self-pay | Admitting: Radiology

## 2017-01-03 ENCOUNTER — Encounter (HOSPITAL_COMMUNITY): Payer: Self-pay | Admitting: Certified Registered"

## 2017-01-03 LAB — PATHOLOGIST SMEAR REVIEW

## 2017-01-03 NOTE — Anesthesia Preprocedure Evaluation (Addendum)
Anesthesia Evaluation  Patient identified by MRN, date of birth, ID band Patient awake    Reviewed: Allergy & Precautions, NPO status , Patient's Chart, lab work & pertinent test results  Airway Mallampati: II  TM Distance: >3 FB Neck ROM: Full    Dental  (+) Partial Lower, Caps, Dental Advisory Given   Pulmonary shortness of breath, former smoker,    Pulmonary exam normal breath sounds clear to auscultation       Cardiovascular hypertension, Pt. 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   Anesthesia Other Findings EF 65-70%  Reproductive/Obstetrics                           Anesthesia Physical Anesthesia Plan  ASA: III  Anesthesia Plan: General   Post-op Pain Management:    Induction: Intravenous  PONV Risk Score and Plan: 3  Airway Management Planned: Oral ETT  Additional Equipment:   Intra-op Plan:   Post-operative Plan: Extubation in OR  Informed Consent: I have reviewed the patients History and Physical, chart, labs and discussed the procedure including the risks, benefits and alternatives for the proposed anesthesia with the patient or authorized representative who has indicated his/her understanding and acceptance.     Plan Discussed with: CRNA, Anesthesiologist and Surgeon  Anesthesia Plan Comments:         Anesthesia Quick Evaluation

## 2017-01-03 NOTE — Progress Notes (Signed)
Anesthesia Chart Review:  Pt is a 76 year old female scheduled for TIPS on 01/06/2017 with Jacqulynn Cadet, MD  - Cardiologist is Charlett Nose, MD in [place], [place]  PMH includes:  Dysrhythmia (unspecified), heart murmur (unspecified), HTN, DM, cirrhosis (s/p thoracentesis x4 for ascites in last 1.5 months), NASH, esophageal varices. Former smoker. BMI 24.5  Medications include: pulmicort, diltiazem, iron, lasix, glimepiride, prilosec, saxagliptin, spironolactone, albuterol  BP (!) 162/80   Pulse 76   Temp 36.8 C (Oral)   Resp 18   Ht 5\' 2"  (1.575 m)   Wt 134 lb 8 oz (61 kg)   SpO2 100%   BMI 24.60 kg/m   Labs will be obtained day of procedure.   EKG requested but Dr. Brandt Loosen office is closed.  Will get EKG day of surgery.   Echo 11/28/16:  - Left ventricle: The cavity size was normal. Wall thickness was increased in a pattern of mild LVH. Systolic function was vigorous. The estimated ejection fraction was in the range of 65% to 70%. Wall motion was normal; there were no regional wall motion abnormalities. Doppler parameters are consistent with restrictive physiology, indicative of decreased left ventricular diastolic compliance and/or increased left atrial pressure. Doppler parameters are consistent with high ventricular filling pressure. - Aortic valve: Severely calcified annulus. Trileaflet; mildly thickened, mildly calcified leaflets. There was no stenosis. - Mitral valve: Mildly calcified annulus. Moderately calcified leaflets. The findings are consistent with mild to moderate stenosis. There was mild regurgitation. Valve area by pressure half-time: 1.25 cm^2. - Tricuspid valve: There was moderate regurgitation.  If EKG acceptable day of surgery, I anticipate pt can proceed with surgery as scheduled.   Willeen Cass, FNP-BC [facility]/Anesthesiology Phone: [phone] 01/03/2017 12:02 PM

## 2017-01-06 ENCOUNTER — Observation Stay (HOSPITAL_COMMUNITY)
Admission: RE | Admit: 2017-01-06 | Discharge: 2017-01-07 | Disposition: A | Discharge status: Home / Self Care | Payer: Medicare Other | Source: Ambulatory Visit | Attending: Interventional Radiology | Admitting: Interventional Radiology

## 2017-01-06 ENCOUNTER — Encounter (HOSPITAL_COMMUNITY): Payer: Self-pay

## 2017-01-06 ENCOUNTER — Encounter (HOSPITAL_COMMUNITY)
Admission: RE | Disposition: A | Discharge status: Home / Self Care | Payer: Self-pay | Source: Ambulatory Visit | Attending: Interventional Radiology

## 2017-01-06 ENCOUNTER — Encounter (HOSPITAL_COMMUNITY): Payer: Self-pay | Admitting: Orthopedic Surgery

## 2017-01-06 ENCOUNTER — Inpatient Hospital Stay (HOSPITAL_COMMUNITY): Payer: Medicare Other | Admitting: Critical Care Medicine

## 2017-01-06 ENCOUNTER — Ambulatory Visit (HOSPITAL_COMMUNITY)
Admission: RE | Admit: 2017-01-06 | Discharge: 2017-01-06 | Disposition: A | Discharge status: Home / Self Care | Payer: Medicare Other | Source: Ambulatory Visit | Attending: Interventional Radiology | Admitting: Interventional Radiology

## 2017-01-06 DIAGNOSIS — E669 Obesity, unspecified: Secondary | ICD-10-CM | POA: Insufficient documentation

## 2017-01-06 DIAGNOSIS — R188 Other ascites: Secondary | ICD-10-CM | POA: Insufficient documentation

## 2017-01-06 DIAGNOSIS — K7460 Unspecified cirrhosis of liver: Secondary | ICD-10-CM | POA: Diagnosis not present

## 2017-01-06 DIAGNOSIS — Z6824 Body mass index (BMI) 24.0-24.9, adult: Secondary | ICD-10-CM | POA: Insufficient documentation

## 2017-01-06 DIAGNOSIS — Z7984 Long term (current) use of oral hypoglycemic drugs: Secondary | ICD-10-CM | POA: Insufficient documentation

## 2017-01-06 DIAGNOSIS — D696 Thrombocytopenia, unspecified: Secondary | ICD-10-CM | POA: Diagnosis not present

## 2017-01-06 DIAGNOSIS — I10 Essential (primary) hypertension: Secondary | ICD-10-CM | POA: Diagnosis not present

## 2017-01-06 DIAGNOSIS — K7581 Nonalcoholic steatohepatitis (NASH): Secondary | ICD-10-CM | POA: Insufficient documentation

## 2017-01-06 DIAGNOSIS — Z87891 Personal history of nicotine dependence: Secondary | ICD-10-CM | POA: Diagnosis not present

## 2017-01-06 DIAGNOSIS — E119 Type 2 diabetes mellitus without complications: Secondary | ICD-10-CM | POA: Diagnosis not present

## 2017-01-06 DIAGNOSIS — K219 Gastro-esophageal reflux disease without esophagitis: Secondary | ICD-10-CM | POA: Insufficient documentation

## 2017-01-06 DIAGNOSIS — D649 Anemia, unspecified: Secondary | ICD-10-CM | POA: Diagnosis not present

## 2017-01-06 HISTORY — PX: IR TIPS: IMG2295

## 2017-01-06 HISTORY — PX: RADIOLOGY WITH ANESTHESIA: SHX6223

## 2017-01-06 HISTORY — PX: IR PARACENTESIS: IMG2679

## 2017-01-06 LAB — CBC
HEMATOCRIT: 33.7 % — AB (ref 36.0–46.0)
HEMOGLOBIN: 11.7 g/dL — AB (ref 12.0–15.0)
MCH: 30.4 pg (ref 26.0–34.0)
MCHC: 34.7 g/dL (ref 30.0–36.0)
MCV: 87.5 fL (ref 78.0–100.0)
PLATELETS: 95 10*3/uL — AB (ref 150–400)
RBC: 3.85 MIL/uL — AB (ref 3.87–5.11)
RDW: 14.2 % (ref 11.5–15.5)
WBC: 5.7 10*3/uL (ref 4.0–10.5)

## 2017-01-06 LAB — COMPREHENSIVE METABOLIC PANEL
ALT: 20 U/L (ref 14–54)
AST: 26 U/L (ref 15–41)
Albumin: 2.5 g/dL — ABNORMAL LOW (ref 3.5–5.0)
Alkaline Phosphatase: 217 U/L — ABNORMAL HIGH (ref 38–126)
Anion gap: 9 (ref 5–15)
BUN: 19 mg/dL (ref 6–20)
CALCIUM: 8.2 mg/dL — AB (ref 8.9–10.3)
CHLORIDE: 101 mmol/L (ref 101–111)
CO2: 19 mmol/L — AB (ref 22–32)
CREATININE: 0.97 mg/dL (ref 0.44–1.00)
GFR calc Af Amer: >60 mL/min (ref >60)
GFR, EST NON AFRICAN AMERICAN: 57 mL/min — AB (ref >60)
Glucose, Bld: 85 mg/dL (ref 65–99)
Potassium: 3.4 mmol/L — ABNORMAL LOW (ref 3.5–5.1)
SODIUM: 129 mmol/L — AB (ref 135–145)
Total Bilirubin: 2.1 mg/dL — ABNORMAL HIGH (ref 0.3–1.2)
Total Protein: 5.2 g/dL — ABNORMAL LOW (ref 6.5–8.1)

## 2017-01-06 LAB — PROTIME-INR
INR: 1.32
Prothrombin Time: 16.3 seconds — ABNORMAL HIGH (ref 11.4–15.2)

## 2017-01-06 LAB — GLUCOSE, CAPILLARY
GLUCOSE-CAPILLARY: 112 mg/dL — AB (ref 65–99)
GLUCOSE-CAPILLARY: 57 mg/dL — AB (ref 65–99)
Glucose-Capillary: 89 mg/dL (ref 65–99)
Glucose-Capillary: 106 mg/dL — ABNORMAL HIGH (ref 65–99)
Glucose-Capillary: 51 mg/dL — ABNORMAL LOW (ref 65–99)
Glucose-Capillary: 87 mg/dL (ref 65–99)

## 2017-01-06 LAB — APTT: APTT: 28 s (ref 24–36)

## 2017-01-06 LAB — PREPARE RBC (CROSSMATCH)

## 2017-01-06 SURGERY — IR WITH ANESTHESIA
Anesthesia: General

## 2017-01-06 MED ORDER — LIDOCAINE HCL (CARDIAC) 20 MG/ML IV SOLN
INTRAVENOUS | Status: DC | PRN
  Administered 2017-01-06: 50 mg via INTRAVENOUS

## 2017-01-06 MED ORDER — PIPERACILLIN-TAZOBACTAM 3.375 G IVPB
3.3750 g | INTRAVENOUS | Status: AC
  Administered 2017-01-06: 3.375 g via INTRAVENOUS
  Filled 2017-01-06 (×2): qty 50

## 2017-01-06 MED ORDER — PANTOPRAZOLE SODIUM 40 MG PO TBEC
40.0000 mg | DELAYED_RELEASE_TABLET | Freq: Every day | ORAL | Status: DC
  Administered 2017-01-06 – 2017-01-07 (×2): 40 mg via ORAL
  Filled 2017-01-06 (×2): qty 1

## 2017-01-06 MED ORDER — PHENYLEPHRINE HCL 10 MG/ML IJ SOLN
INTRAMUSCULAR | Status: DC | PRN
  Administered 2017-01-06: 25 ug/min via INTRAVENOUS

## 2017-01-06 MED ORDER — GELATIN ABSORBABLE 12-7 MM EX MISC
CUTANEOUS | Status: AC
  Administered 2017-01-06: 16:00:00
  Filled 2017-01-06: qty 1

## 2017-01-06 MED ORDER — DILTIAZEM HCL ER COATED BEADS 120 MG PO CP24
120.0000 mg | ORAL_CAPSULE | Freq: Every day | ORAL | Status: DC
  Administered 2017-01-07: 120 mg via ORAL
  Filled 2017-01-06: qty 1

## 2017-01-06 MED ORDER — FUROSEMIDE 40 MG PO TABS
40.0000 mg | ORAL_TABLET | Freq: Every day | ORAL | Status: DC
  Administered 2017-01-07: 40 mg via ORAL
  Filled 2017-01-06: qty 1

## 2017-01-06 MED ORDER — NEOSTIGMINE METHYLSULFATE 10 MG/10ML IV SOLN
INTRAVENOUS | Status: DC | PRN
  Administered 2017-01-06: 3 mg via INTRAVENOUS

## 2017-01-06 MED ORDER — DEXTROSE 50 % IV SOLN
INTRAVENOUS | Status: AC
  Administered 2017-01-06: 12.5 mL via INTRAVENOUS
  Filled 2017-01-06: qty 50

## 2017-01-06 MED ORDER — ONDANSETRON HCL 4 MG/2ML IJ SOLN
INTRAMUSCULAR | Status: DC | PRN
  Administered 2017-01-06: 4 mg via INTRAVENOUS

## 2017-01-06 MED ORDER — LINAGLIPTIN 5 MG PO TABS
5.0000 mg | ORAL_TABLET | Freq: Every day | ORAL | Status: DC
  Administered 2017-01-07: 5 mg via ORAL
  Filled 2017-01-06: qty 1

## 2017-01-06 MED ORDER — IOPAMIDOL (ISOVUE-300) INJECTION 61%
INTRAVENOUS | Status: AC
  Administered 2017-01-06: 75 mL
  Filled 2017-01-06: qty 300

## 2017-01-06 MED ORDER — SODIUM CHLORIDE 0.9 % IV SOLN
Freq: Once | INTRAVENOUS | Status: AC
  Administered 2017-01-06: 11:00:00 via INTRAVENOUS

## 2017-01-06 MED ORDER — DEXTROSE 50 % IV SOLN
INTRAVENOUS | Status: AC
  Filled 2017-01-06: qty 50

## 2017-01-06 MED ORDER — FENTANYL CITRATE (PF) 100 MCG/2ML IJ SOLN
INTRAMUSCULAR | Status: DC | PRN
  Administered 2017-01-06: 100 ug via INTRAVENOUS

## 2017-01-06 MED ORDER — DEXTROSE 50 % IV SOLN
INTRAVENOUS | Status: DC | PRN
  Administered 2017-01-06: 12.5 g via INTRAVENOUS

## 2017-01-06 MED ORDER — ALBUMIN HUMAN 5 % IV SOLN
INTRAVENOUS | Status: DC | PRN
  Administered 2017-01-06 (×2): via INTRAVENOUS

## 2017-01-06 MED ORDER — LACTULOSE 10 GM/15ML PO SOLN
10.0000 g | Freq: Three times a day (TID) | ORAL | Status: DC
  Administered 2017-01-06 – 2017-01-07 (×2): 10 g via ORAL
  Filled 2017-01-06 (×2): qty 15

## 2017-01-06 MED ORDER — SODIUM CHLORIDE 0.9 % IV SOLN
INTRAVENOUS | Status: DC
  Administered 2017-01-06: 10:00:00 via INTRAVENOUS

## 2017-01-06 MED ORDER — DEXTROSE 50 % IV SOLN
12.5000 mL | Freq: Once | INTRAVENOUS | Status: AC
Start: 2017-01-06 — End: 2017-01-06
  Administered 2017-01-06: 12.5 mL via INTRAVENOUS

## 2017-01-06 MED ORDER — PROPOFOL 10 MG/ML IV BOLUS
INTRAVENOUS | Status: DC | PRN
  Administered 2017-01-06: 100 mg via INTRAVENOUS

## 2017-01-06 MED ORDER — SPIRONOLACTONE 50 MG PO TABS
50.0000 mg | ORAL_TABLET | Freq: Every day | ORAL | Status: DC
  Administered 2017-01-06 – 2017-01-07 (×2): 50 mg via ORAL
  Filled 2017-01-06 (×2): qty 1

## 2017-01-06 MED ORDER — GLYCOPYRROLATE 0.2 MG/ML IJ SOLN
INTRAMUSCULAR | Status: DC | PRN
  Administered 2017-01-06: 0.4 mg via INTRAVENOUS

## 2017-01-06 MED ORDER — SUCCINYLCHOLINE CHLORIDE 20 MG/ML IJ SOLN
INTRAMUSCULAR | Status: DC | PRN
  Administered 2017-01-06: 100 mg via INTRAVENOUS

## 2017-01-06 MED ORDER — HYDROCODONE-ACETAMINOPHEN 5-325 MG PO TABS
1.0000 | ORAL_TABLET | ORAL | Status: DC | PRN
  Administered 2017-01-06 – 2017-01-07 (×2): 2 via ORAL
  Filled 2017-01-06 (×2): qty 2

## 2017-01-06 MED ORDER — ROCURONIUM BROMIDE 100 MG/10ML IV SOLN
INTRAVENOUS | Status: DC | PRN
  Administered 2017-01-06: 30 mg via INTRAVENOUS

## 2017-01-06 MED ORDER — GLIMEPIRIDE 4 MG PO TABS
4.0000 mg | ORAL_TABLET | Freq: Every day | ORAL | Status: DC
  Administered 2017-01-07: 4 mg via ORAL
  Filled 2017-01-06: qty 1

## 2017-01-06 NOTE — Transfer of Care (Signed)
Immediate Anesthesia Transfer of Care Note  Patient: Cynthia Long  Procedure(s) Performed: TIPS (N/A )  Patient Location: PACU  Anesthesia Type:General  Level of Consciousness: awake, alert  and oriented  Airway & Oxygen Therapy: Patient connected to face mask oxygen  Post-op Assessment: Post -op Vital signs reviewed and stable  Post vital signs: stable  Last Vitals:  Vitals:   01/06/17 0959 01/06/17 1009  BP:  (!) 160/55  Pulse: 91   Resp: 18   Temp: 36.8 C   SpO2: 100%     Last Pain:  Vitals:   01/06/17 0959  TempSrc: Oral      Patients Stated Pain Goal: 3 (16/10/96 0454)  Complications: No apparent anesthesia complications

## 2017-01-06 NOTE — Anesthesia Procedure Notes (Signed)
Procedure Name: Intubation Date/Time: 01/06/2017 2:47 PM Performed by: Lavell Luster, CRNA Pre-anesthesia Checklist: Patient identified, Emergency Drugs available, Suction available, Patient being monitored and Timeout performed Patient Re-evaluated:Patient Re-evaluated prior to induction Oxygen Delivery Method: Circle system utilized Preoxygenation: Pre-oxygenation with 100% oxygen Induction Type: IV induction Ventilation: Mask ventilation without difficulty Laryngoscope Size: Mac and 3 Grade View: Grade I Tube type: Oral Tube size: 7.0 mm Number of attempts: 1 Airway Equipment and Method: Stylet Placement Confirmation: ETT inserted through vocal cords under direct vision,  positive ETCO2 and breath sounds checked- equal and bilateral Secured at: 22 cm Tube secured with: Tape Dental Injury: Teeth and Oropharynx as per pre-operative assessment

## 2017-01-06 NOTE — Anesthesia Procedure Notes (Signed)
Arterial Line Insertion Start/End11/07/2016 11:30 AM, 01/06/2017 11:40 AM Performed by: Oletta Lamas, CRNA, CRNA  Patient location: Pre-op. Preanesthetic checklist: patient identified, IV checked, site marked, risks and benefits discussed, surgical consent, monitors and equipment checked, pre-op evaluation and timeout performed Lidocaine 1% used for infiltration Right, radial was placed Catheter size: 20 G Hand hygiene performed , maximum sterile barriers used  and Seldinger technique used Allen's test indicative of satisfactory collateral circulation Attempts: 2 Procedure performed without using ultrasound guided technique. Following insertion, dressing applied and Biopatch. Post procedure assessment: normal  Patient tolerated the procedure well with no immediate complications.

## 2017-01-06 NOTE — H&P (Deleted)
  The note originally documented on this encounter has been moved the the encounter in which it belongs.  

## 2017-01-06 NOTE — H&P (Signed)
Chief Complaint: Patient was seen in consultation today for Transjugular Intrahepatic Portal System Shunt placement at the request of Dr Darci Current  Referring Physician(s): Dr Darci Current  Supervising Physician: Jacqulynn Cadet  Patient Status: [facility] - Out-pt  History of Present Illness: Cynthia Long is a 76 y.o. female   Dr Laurence Ferrari Note 12/11/16: Cynthia Long is a 76 y.o. female with a history of NASH cirrhosis , complicated by recurrent large volume symptomatic ascites. Her ascites has been resistant to diuretic therapy. She has suffered from leg cramps, hyponatremia and worsening renal function. Her diuretic dose was recently decreased. She denies current fever, chills, chest pain, nausea or vomiting. Her appetite is average. She denies ever having experienced symptoms of encephalopathy. No confusion, clumsiness or other encephalopathic symptoms. She has no known cardiac history and recently had an echocardiogram performed.  Cynthia Long appears to be an excellent candidate for creation of a transjugular intrahepatic portosystemic shunt. Her recent echocardiogram demonstrates strong right and left heart function, she has at very low risk for post TIPS cardiac complications. She has never struggled with encephalopathy and is therefore at very low risk for encephalopathy. Her current meld score is 17 which is in the intermediate risk range. Typically, we do not perform elective TIPS when a meld score is 20 or higher. However, her TIPS is driven partly by her current renal function which has increased with her diuretic usage. Now that her diuretic dose has been reduced, if her renal function has improved to normal, her meld score would come down to 12 which is low risk.    Most recent paracentesis 11/1: 6 Liters Feels she has re accumulated already Denies any other symptoms Scheduled for TIPs procedure today in IR   Past Medical History:  Diagnosis Date  . Allergy   . Anemia   . ARF  (acute renal failure) ([place]) 2015   ?prerenal azotemia- resolved by discharge  . Blood transfusion without reported diagnosis   . Bronchitis, acute   . Cholelithiasis   . Cirrhosis ([place]) 08/2013   likely from NASH  . Diabetes mellitus without complication ([place])   . Dyspnea    when fluid builds up in abdomen  . Dysrhythmia   . Esophageal varices ([place])   . Gastritis   . GI bleed 08/2013, 10/2013   EGD negative 08/2013.   Marland Kitchen Headache(784.0)   . Heart murmur   . Hypertension   . Obesity   . Pertussis 2011  . Tubular adenoma of colon   . Uncontrolled type 2 diabetes mellitus , without long-term current use of insulin ([place]) 11/15/2016    Past Surgical History:  Procedure Laterality Date  . COLONOSCOPY W/ BIOPSIES    . IR PARACENTESIS  01/02/2017  . IR RADIOLOGIST EVAL & MGMT  12/11/2016  . VAGINAL HYSTERECTOMY     partial, for prolapsed uterus.    Allergies: Biaxin [clarithromycin] and Sulfa antibiotics  Medications: Prior to Admission medications   Medication Sig Start Date End Date Taking? Authorizing Provider  acetaminophen (TYLENOL) 325 MG tablet Take 325 mg by mouth every 6 (six) hours as needed for mild pain or headache.    [provider]  budesonide (PULMICORT) 0.5 MG/2ML nebulizer solution Take 0.5 mg by nebulization daily as needed (for shortness of breath).    [provider]  CALCIUM LACTATE PO Take 6 tablets by mouth every evening.    [provider]  cholecalciferol (VITAMIN D) 1000 units tablet Take 1,000 Units by mouth every  evening.    [provider]  diltiazem (DILACOR XR) 120 MG 24 hr capsule Take 120 mg by mouth daily.    [provider]  ferrous fumarate (FERRETTS) 325 (106 Fe) MG TABS tablet Take 1 tablet by mouth 2 (two) times daily.    [provider]  furosemide (LASIX) 40 MG tablet Take 40 mg by mouth daily.  11/11/16   [provider]  glimepiride (AMARYL) 4 MG tablet Take 4 mg by mouth daily  with breakfast.     [provider]  Lancets (ONETOUCH ULTRASOFT) lancets 1 each by Other route 2 (two) times daily.  04/23/14   [provider]  omeprazole (PRILOSEC) 20 MG capsule Take 20 mg by mouth every other day.    [provider]  PRESCRIPTION MEDICATION every 7 (seven) days. Allergy Shots     [provider]  saxagliptin HCl (ONGLYZA) 5 MG TABS tablet Take 5 mg by mouth daily.    [provider]  spironolactone (ALDACTONE) 50 MG tablet Take 50 mg by mouth daily.  11/11/16   [provider]  VENTOLIN HFA 108 (90 Base) MCG/ACT inhaler Inhale 2 puffs into the lungs every 6 (six) hours as needed for wheezing or shortness of breath.  10/14/16   [provider]     Family History  Problem Relation Age of Onset  . Breast cancer Daughter   . Stomach cancer Neg Hx   . Colon cancer Neg Hx     Social History   Socioeconomic History  . Marital status: Single    Spouse name: None  . Number of children: 2  . Years of education: None  . Highest education level: None  Social Needs  . Financial resource strain: None  . Food insecurity - worry: None  . Food insecurity - inability: None  . Transportation needs - medical: None  . Transportation needs - non-medical: None  Occupational History  . Occupation: retired    Fish farm manager: Engineer, [employer] & [employer]  Tobacco Use  . Smoking status: Former Smoker    Years: 1.00    Types: Cigarettes    Last attempt to quit: 10/08/1961    Years since quitting: 55.2  . Smokeless tobacco: Never Used  . Tobacco comment: as a teenager-   Substance and Sexual Activity  . Alcohol use: No  . Drug use: No  . Sexual activity: Not Currently    Partners: Male  Other Topics Concern  . None  Social History Narrative   Retired Human resources officer work   Divorced   1 son and 1 daughter      Review of Systems: A 12 point ROS discussed and pertinent positives are indicated in the HPI above.  All other  systems are negative.  Review of Systems  Constitutional: Positive for activity change, appetite change, fatigue and unexpected weight change. Negative for fever.  Respiratory: Positive for shortness of breath.   Cardiovascular: Negative for chest pain.  Gastrointestinal: Positive for abdominal distention, abdominal pain and nausea.  Musculoskeletal: Positive for gait problem.  Neurological: Positive for weakness.  Psychiatric/Behavioral: Negative for behavioral problems and confusion.    Vital Signs: There were no vitals taken for this visit.  Physical Exam  Constitutional: She is oriented to person, place, and time.  HENT:  Head: Atraumatic.  Eyes: EOM are normal.  Cardiovascular: Normal rate.  Murmur heard. Pulmonary/Chest: Effort normal and breath sounds normal.  Abdominal: Soft. Bowel sounds are normal. She exhibits distension. There is  no tenderness.  Musculoskeletal: Normal range of motion.  Neurological: She is alert and oriented to person, place, and time.  Skin: Skin is warm and dry.  Psychiatric: She has a normal mood and affect. Her behavior is normal. Judgment and thought content normal.  Nursing note and vitals reviewed.   Imaging: Mr Abdomen Wwo Contrast  Result Date: 12/17/2016 CLINICAL DATA:  Cirrhosis.  Ascites. EXAM: MRI ABDOMEN WITHOUT AND WITH CONTRAST TECHNIQUE: Multiplanar multisequence MR imaging of the abdomen was performed both before and after the administration of intravenous contrast. CONTRAST:  33mL EOVIST GADOXETATE DISODIUM 0.25 MOL/L IV SOLN COMPARISON:  10/09/2013 CT abdomen/ pelvis. Outside MRI abdomen from 06/14/2016. FINDINGS: Evaluation of the stomach, pancreas and left liver lobe is significantly limited by prominent susceptibility artifact centered in the proximal stomach. Lower chest: No acute abnormality at the lung bases. Hepatobiliary: Liver surface is diffusely irregular compatible with cirrhosis. No hepatic steatosis. No liver mass.  Layering 6 mm gallstone in the nondistended gallbladder with mild diffuse gallbladder wall thickening. No biliary ductal dilatation. Common bile duct diameter 4 mm. No choledocholithiasis. Pancreas: No evidence of pancreatic mass or duct dilation. No pancreas divisum. Spleen: Stable mildly enlarged spleen (craniocaudal splenic length 13.4 cm. No splenic mass. Adrenals/Urinary Tract: Normal adrenals. No hydronephrosis. Normal kidneys with no renal mass. Stomach/Bowel: Nonvisualization of the proximal stomach due to susceptibility artifact. Stomach is nondistended. Visualized small and large bowel is normal caliber, with no bowel wall thickening. Vascular/Lymphatic: Atherosclerotic nonaneurysmal abdominal aorta. Patent portal and renal veins. Splenic and hepatic veins not well visualized due to susceptibility artifact. No pathologically enlarged lymph nodes in the abdomen. Other: Large volume ascites.  No focal fluid collection. Musculoskeletal: No aggressive appearing focal osseous lesions. IMPRESSION: 1. Cirrhosis.  No liver mass. 2. Large volume ascites.  Mild splenomegaly. 3. Cholelithiasis. Nonspecific mild diffuse gallbladder wall thickening, probably due to noninflammatory edema. Correlate with clinical exam. No biliary ductal dilatation. Electronically Signed   By: Ilona Sorrel M.D.   On: 12/17/2016 10:45   US Paracentesis  Result Date: 12/17/2016 INDICATION: Cirrhosis likely due to NASH, ascites EXAM: ULTRASOUND GUIDED DIAGNOSTIC AND THERAPEUTIC PARACENTESIS MEDICATIONS: None. COMPLICATIONS: None immediate. PROCEDURE: Procedure, benefits, and risks of procedure were discussed with patient. Written informed consent for procedure was obtained. Time out protocol followed. Adequate collection of ascites localized by ultrasound in LEFT lower quadrant. Skin prepped and draped in usual sterile fashion. Skin and soft tissues anesthetized with 10 mL of 1% lidocaine. 5 [place] Yueh catheter placed into peritoneal  cavity. 5.2 L of yellow ascitic fluid aspirated by vacuum bottle suction. Procedure tolerated well by patient without immediate complication. FINDINGS: A total of approximately 5.2 L of ascitic fluid was removed. Samples were sent to the laboratory as requested by the clinical team. IMPRESSION: Successful ultrasound-guided paracentesis yielding 5.2 liters of peritoneal fluid. Electronically Signed   By: Lavonia Dana M.D.   On: 12/17/2016 11:47   Ir Radiologist Eval & Mgmt  Result Date: 01/01/2017 Please refer to notes tab for details about interventional procedure. (Op Note)  Ir Paracentesis  Result Date: 01/02/2017 INDICATION: Cirrhosis likely due to NASH, ascites. Request for therapeutic paracentesis. EXAM: ULTRASOUND GUIDED LEFT LATERAL ABDOMEN PARACENTESIS MEDICATIONS: 1% Lidocaine = 10 mL COMPLICATIONS: None immediate. PROCEDURE: Informed written consent was obtained from the patient after a discussion of the risks, benefits and alternatives to treatment. A timeout was performed prior to the initiation of the procedure. Initial ultrasound scanning demonstrates a large amount of ascites within the left  lateral abdomen. The left lateral abdomen was prepped and draped in the usual sterile fashion. 1% lidocaine with epinephrine was used for local anesthesia. Following this, a 19 gauge, 7-cm, Yueh catheter was introduced. An ultrasound image was saved for documentation purposes. The paracentesis was performed. The catheter was removed and a dressing was applied. The patient tolerated the procedure well without immediate post procedural complication. FINDINGS: A total of approximately 6 liters of clear yellow fluid was removed. Samples were sent to the laboratory as requested by the clinical team. IMPRESSION: Successful ultrasound-guided paracentesis yielding 6 liters of peritoneal fluid. Read by:  Gareth Eagle, PA-C Electronically Signed   By: Marybelle Killings M.D.   On: 01/02/2017 10:43     Labs:  CBC: Recent Labs    11/14/16 1144 12/11/16 1126 01/06/17 1130  WBC 8.6 6.8 5.7  HGB 15.9* 14.5 11.7*  HCT 47.4* 42.5 33.7*  PLT 116.0* 116* PENDING    COAGS: Recent Labs    11/14/16 1144 12/11/16 1126  INR 1.2* 1.0    BMP: Recent Labs    11/14/16 1144 12/11/16 1126  NA 131* 133*  K 4.6 4.0  CL 94* 96*  CO2 26 22  GLUCOSE 232* 153*  BUN 30* 25  CALCIUM 10.0 9.2  CREATININE 1.59* 1.35*  GFRNONAA  --  39*  GFRAA  --  45*    LIVER FUNCTION TESTS: Recent Labs    11/14/16 1144 12/11/16 1126  BILITOT 2.9* 2.3*  AST 30 27  ALT 34 22  ALKPHOS 296*  --   PROT 7.4 6.5  ALBUMIN 3.7  --     TUMOR MARKERS: Recent Labs    11/14/16 1144  AFPTM 6.1*    Assessment and Plan:  NASH Recurrent ascites Refractory to paracentesis and diuretics Has been seen in consultation for Transjugular Intrahepatic Portal System Shunt procedure in IR Scheduled now for same Risks and benefits of TIPS, BRTO and/or additional variceal embolization were discussed with the patient and/or the patient's family including, but not limited to, infection, bleeding, damage to adjacent structures, worsening hepatic and/or cardiac function, non-target embolization and death.  This interventional procedure involves the use of X-rays and because of the nature of the planned procedure, it is possible that we will have prolonged use of X-ray fluoroscopy. Potential radiation risks to you include (but are not limited to) the following: - A slightly elevated risk for cancer  several years later in life. This risk is typically less than 0.5% percent. This risk is low in comparison to the normal incidence of human cancer, which is 33% for women and 50% for men according to the [employer]. - Radiation induced injury can include skin redness, resembling a rash, tissue breakdown / ulcers and hair loss (which can be temporary or permanent).  The likelihood of either of these  occurring depends on the difficulty of the procedure and whether you are sensitive to radiation due to previous procedures, disease, or genetic conditions.  IF your procedure requires a prolonged use of radiation, you will be notified and given written instructions for further action.  It is your responsibility to monitor the irradiated area for the 2 weeks following the procedure and to notify your physician if you are concerned that you have suffered a radiation induced injury.    All of the patient's questions were answered, patient is agreeable to proceed. Consent signed and in chart.    Thank you for this interesting consult.  I greatly enjoyed meeting Kellen  Diegel and look forward to participating in their care.  A copy of this report was sent to the requesting provider on this date.  Electronically Signed: Lavonia Drafts, PA-C 01/06/2017, 12:15 PM   I spent a total of  40 Minutes   in face to face in clinical consultation, greater than 50% of which was counseling/coordinating care for TIPS procedure

## 2017-01-06 NOTE — Procedures (Signed)
Interventional Radiology Procedure Note  Procedure:  1.) Paracentesis 2.) TIPS  Complications: None  Estimated Blood Loss: < 25 mL  Recommendations: - Admit for obs - Labs in am - Start lactulose now, pt needs a BM prior to DC tomorrow - Home on lactulose - Follow up in 6 weeks at clinic with labs and TIPS doppler US  Signed,  Criselda Peaches, MD

## 2017-01-06 NOTE — Progress Notes (Signed)
CBG 51, 1/2 amp D50 given by CRNA [facility]. Dr Royce Macadamia here & aware. Will continue to monitor.

## 2017-01-06 NOTE — Progress Notes (Signed)
Main portal vein pressure: 20 Hepatic vein pressure: 17

## 2017-01-06 NOTE — Progress Notes (Signed)
Right neck dsg. And right abd dsg. (gauze & Tegaderm) CDI on adm. And dc from PACU.

## 2017-01-06 NOTE — Progress Notes (Signed)
Main portal vein pressure: 26 R hepatic vein pressure: 16/10, (13)

## 2017-01-06 NOTE — Progress Notes (Signed)
Lab called, said pt had antibodies in blood and needs to work the patient up since they were not in their system. Spoke with IR, made them aware of the delay of getting the blood ready. They said Dr. Laurence Ferrari was running 1-1 1/2 hrs behind.

## 2017-01-07 ENCOUNTER — Other Ambulatory Visit: Payer: Self-pay

## 2017-01-07 ENCOUNTER — Other Ambulatory Visit (HOSPITAL_COMMUNITY): Payer: Self-pay | Admitting: Interventional Radiology

## 2017-01-07 ENCOUNTER — Encounter (HOSPITAL_COMMUNITY): Payer: Self-pay | Admitting: Interventional Radiology

## 2017-01-07 ENCOUNTER — Other Ambulatory Visit: Payer: Self-pay | Admitting: *Deleted

## 2017-01-07 DIAGNOSIS — K7460 Unspecified cirrhosis of liver: Secondary | ICD-10-CM | POA: Diagnosis not present

## 2017-01-07 DIAGNOSIS — K7581 Nonalcoholic steatohepatitis (NASH): Principal | ICD-10-CM

## 2017-01-07 LAB — COMPREHENSIVE METABOLIC PANEL
ALBUMIN: 2.7 g/dL — AB (ref 3.5–5.0)
ALK PHOS: 138 U/L — AB (ref 38–126)
ALT: 89 U/L — AB (ref 14–54)
ANION GAP: 8 (ref 5–15)
AST: 130 U/L — ABNORMAL HIGH (ref 15–41)
BILIRUBIN TOTAL: 3 mg/dL — AB (ref 0.3–1.2)
BUN: 13 mg/dL (ref 6–20)
CALCIUM: 7.9 mg/dL — AB (ref 8.9–10.3)
CO2: 18 mmol/L — ABNORMAL LOW (ref 22–32)
CREATININE: 0.96 mg/dL (ref 0.44–1.00)
Chloride: 105 mmol/L (ref 101–111)
GFR calc Af Amer: >60 mL/min (ref >60)
GFR calc non Af Amer: 57 mL/min — ABNORMAL LOW (ref >60)
GLUCOSE: 57 mg/dL — AB (ref 65–99)
Potassium: 3.6 mmol/L (ref 3.5–5.1)
Sodium: 131 mmol/L — ABNORMAL LOW (ref 135–145)
TOTAL PROTEIN: 4.6 g/dL — AB (ref 6.5–8.1)

## 2017-01-07 LAB — CBC
HEMATOCRIT: 29.4 % — AB (ref 36.0–46.0)
HEMOGLOBIN: 10.1 g/dL — AB (ref 12.0–15.0)
MCH: 30.6 pg (ref 26.0–34.0)
MCHC: 34.4 g/dL (ref 30.0–36.0)
MCV: 89.1 fL (ref 78.0–100.0)
Platelets: 95 10*3/uL — ABNORMAL LOW (ref 150–400)
RBC: 3.3 MIL/uL — ABNORMAL LOW (ref 3.87–5.11)
RDW: 14.8 % (ref 11.5–15.5)
WBC: 8.8 10*3/uL (ref 4.0–10.5)

## 2017-01-07 LAB — CULTURE, BODY FLUID W GRAM STAIN -BOTTLE: Culture: NO GROWTH

## 2017-01-07 LAB — CULTURE, BODY FLUID-BOTTLE

## 2017-01-07 MED ORDER — INFLUENZA VAC SPLIT HIGH-DOSE 0.5 ML IM SUSY: 0.5000 mL | PREFILLED_SYRINGE | INTRAMUSCULAR | Status: DC

## 2017-01-07 NOTE — Progress Notes (Signed)
Pt discharged home in stable condition after going over discharge instructions with no concerns voiced. No changes made to home meds

## 2017-01-07 NOTE — Anesthesia Postprocedure Evaluation (Signed)
Anesthesia Post Note  Patient: Cynthia Long  Procedure(s) Performed: TIPS (N/A )     Patient location during evaluation: PACU Anesthesia Type: General Level of consciousness: awake and alert and oriented Pain management: pain level controlled Vital Signs Assessment: post-procedure vital signs reviewed and stable Respiratory status: spontaneous breathing, nonlabored ventilation, respiratory function stable and patient connected to nasal cannula oxygen Cardiovascular status: blood pressure returned to baseline and stable Postop Assessment: no apparent nausea or vomiting Anesthetic complications: no                  Channing Yeager A.

## 2017-01-07 NOTE — Discharge Summary (Signed)
Patient ID: Cynthia Long MRN: [id number] DOB/AGE: 1940-04-16 76 y.o.  Admit date: 01/06/2017 Discharge date: 01/07/2017  Supervising Physician: Jacqulynn Cadet  Patient Status: [facility] - In-pt  Admission Diagnoses: NASH Cirrhosis                                          recurrent ascites  Discharge Diagnoses:  Active Problems:   Liver cirrhosis secondary to NASH [facility])   Discharged Condition: stable  Hospital Course: NASH Cirrhosis; recurrent ascites. Transjugular Intrahepatic Portal System Shunt placement and Paracentesis was performed in IR with Dr Laurence Ferrari 01/06/17. Pt tolerated procedure well. No complications Admitted overnight for observation Stay was uneventful She is alert and orientated. Eating well;  Slept well Denies N/V Has not had BM yet today. Ambulating in room I have seen and examined pt. Reported status to Dr Laurence Ferrari Plan for discharge to home now.   Consults: None  Significant Diagnostic Studies: US Abdomen  Treatments: Interventional Radiology Procedure Note Procedure:  1.) Paracentesis 2.) TIPS   Discharge Exam: Blood pressure (!) 100/40, pulse 76, temperature 99 F (37.2 C), temperature source Oral, resp. rate 16, height 5\' 2"  (1.575 m), weight 134 lb 8 oz (61 kg), SpO2 93 %.  Appropriate A/O [place] RRR Lungs CTA Abd distended; NT Right abd procedure sites are clean and dry; NT; no bleeding Right neck site is sl tender Ecchymotic; no hematoma; no bleeding Extr FROM Ambulating in room UOP clear yellow  Results for orders placed or performed during the hospital encounter of 01/06/17  Comprehensive metabolic panel  Result Value Ref Range   Sodium 129 (L) 135 - 145 mmol/L   Potassium 3.4 (L) 3.5 - 5.1 mmol/L   Chloride 101 101 - 111 mmol/L   CO2 19 (L) 22 - 32 mmol/L   Glucose, Bld 85 65 - 99 mg/dL   BUN 19 6 - 20 mg/dL   Creatinine, Ser 0.97 0.44 - 1.00 mg/dL   Calcium 8.2 (L) 8.9 - 10.3 mg/dL   Total Protein 5.2  (L) 6.5 - 8.1 g/dL   Albumin 2.5 (L) 3.5 - 5.0 g/dL   AST 26 15 - 41 U/L   ALT 20 14 - 54 U/L   Alkaline Phosphatase 217 (H) 38 - 126 U/L   Total Bilirubin 2.1 (H) 0.3 - 1.2 mg/dL   GFR calc non Af Amer 57 (L) >60 mL/min   GFR calc Af Amer >60 >60 mL/min   Anion gap 9 5 - 15  CBC upon arrival  Result Value Ref Range   WBC 5.7 4.0 - 10.5 K/uL   RBC 3.85 (L) 3.87 - 5.11 MIL/uL   Hemoglobin 11.7 (L) 12.0 - 15.0 g/dL   HCT 33.7 (L) 36.0 - 46.0 %   MCV 87.5 78.0 - 100.0 fL   MCH 30.4 26.0 - 34.0 pg   MCHC 34.7 30.0 - 36.0 g/dL   RDW 14.2 11.5 - 15.5 %   Platelets 95 (L) 150 - 400 K/uL  Glucose, capillary  Result Value Ref Range   Glucose-Capillary 89 65 - 99 mg/dL  CBC  Result Value Ref Range   WBC 8.8 4.0 - 10.5 K/uL   RBC 3.30 (L) 3.87 - 5.11 MIL/uL   Hemoglobin 10.1 (L) 12.0 - 15.0 g/dL   HCT 29.4 (L) 36.0 - 46.0 %   MCV 89.1 78.0 - 100.0 fL   MCH 30.6  26.0 - 34.0 pg   MCHC 34.4 30.0 - 36.0 g/dL   RDW 14.8 11.5 - 15.5 %   Platelets 95 (L) 150 - 400 K/uL  Comprehensive metabolic panel  Result Value Ref Range   Sodium 131 (L) 135 - 145 mmol/L   Potassium 3.6 3.5 - 5.1 mmol/L   Chloride 105 101 - 111 mmol/L   CO2 18 (L) 22 - 32 mmol/L   Glucose, Bld 57 (L) 65 - 99 mg/dL   BUN 13 6 - 20 mg/dL   Creatinine, Ser 0.96 0.44 - 1.00 mg/dL   Calcium 7.9 (L) 8.9 - 10.3 mg/dL   Total Protein 4.6 (L) 6.5 - 8.1 g/dL   Albumin 2.7 (L) 3.5 - 5.0 g/dL   AST 130 (H) 15 - 41 U/L   ALT 89 (H) 14 - 54 U/L   Alkaline Phosphatase 138 (H) 38 - 126 U/L   Total Bilirubin 3.0 (H) 0.3 - 1.2 mg/dL   GFR calc non Af Amer 57 (L) >60 mL/min   GFR calc Af Amer >60 >60 mL/min   Anion gap 8 5 - 15  Glucose, capillary  Result Value Ref Range   Glucose-Capillary 87 65 - 99 mg/dL  Type and screen [place]  Result Value Ref Range   ABO/RH(D) A POS    Antibody Screen POS    Sample Expiration 01/09/2017    Antibody Identification ANTI E    PT AG Type NEGATIVE FOR E ANTIGEN     Unit Number [id number]    Blood Component Type RED CELLS,LR    Unit division 00    Status of Unit ALLOCATED    Donor AG Type NEGATIVE FOR E ANTIGEN    Transfusion Status OK TO TRANSFUSE    Crossmatch Result COMPATIBLE    Unit Number [id number]    Blood Component Type RED CELLS,LR    Unit division 00    Status of Unit ALLOCATED    Donor AG Type NEGATIVE FOR E ANTIGEN    Transfusion Status OK TO TRANSFUSE    Crossmatch Result COMPATIBLE   Prepare RBC  Result Value Ref Range   Order Confirmation ORDER PROCESSED BY BLOOD BANK   BPAM RBC  Result Value Ref Range   ISSUE DATE / TIME [id number]    Blood Product Unit Number [id number]    PRODUCT CODE [id number]    Unit Type and Rh 6200    Blood Product Expiration Date 893810175102    ISSUE DATE / TIME [id number]    Blood Product Unit Number [id number]    PRODUCT CODE [id number]    Unit Type and Rh 6200    Blood Product Expiration Date [id number]      Disposition: NASH Cirrhosis TIPS procedure performed in IR 11/5 Overnight stay was without event Plan for discharge to home now Resume all meds Rx: Lactulose 2-4x/daily---to achieve 2-3 soft stools daily Pt has good understanding of dc instructions and plan Follow up in [facility] 6 weeks---labs and [place] TIPS  Discharge Instructions    Call MD for:  difficulty breathing, headache or visual disturbances   Complete by:  As directed    Call MD for:  extreme fatigue   Complete by:  As directed    Call MD for:  hives   Complete by:  As directed    Call MD for:  persistant dizziness or light-headedness   Complete by:  As directed    Call MD for:  persistant nausea and vomiting  Complete by:  As directed    Call MD for:  redness, tenderness, or signs of infection (pain, swelling, redness, odor or green/yellow discharge around incision site)   Complete by:  As directed    Call MD for:  severe uncontrolled pain   Complete by:  As directed    Call MD for:  temperature  >100.4   Complete by:  As directed    Diet - low sodium heart healthy   Complete by:  As directed    Discharge instructions   Complete by:  As directed    Resume all meds; Lactulose 2-4 x/daily---achieve 2-3 soft stools/day   Discharge wound care:   Complete by:  As directed    May shower today; keep clean band aid on Rt neck site daily x 1 week   Driving Restrictions   Complete by:  As directed    No driving x 1 week   Increase activity slowly   Complete by:  As directed    Lifting restrictions   Complete by:  As directed    No lifting over 10 lbs x 1 week     Allergies as of 01/07/2017      Reactions   Biaxin [clarithromycin]    Liver enzymes elevated per pt   Sulfa Antibiotics Diarrhea      Medication List    TAKE these medications   acetaminophen 325 MG tablet Commonly known as:  TYLENOL Take 325 mg by mouth every 6 (six) hours as needed for mild pain or headache.   budesonide 0.5 MG/2ML nebulizer solution Commonly known as:  PULMICORT Take 0.5 mg by nebulization daily as needed (for shortness of breath).   CALCIUM LACTATE PO Take 6 tablets by mouth every evening.   cholecalciferol 1000 units tablet Commonly known as:  VITAMIN D Take 1,000 Units by mouth every evening.   diltiazem 120 MG 24 hr capsule Commonly known as:  DILACOR XR Take 120 mg by mouth daily.   FERRETTS 325 (106 Fe) MG Tabs tablet Generic drug:  ferrous fumarate Take 1 tablet by mouth 2 (two) times daily.   furosemide 40 MG tablet Commonly known as:  LASIX Take 40 mg by mouth daily.   glimepiride 4 MG tablet Commonly known as:  AMARYL Take 4 mg by mouth daily with breakfast.   omeprazole 20 MG capsule Commonly known as:  PRILOSEC Take 20 mg by mouth every other day.   onetouch ultrasoft lancets 1 each by Other route 2 (two) times daily.   ONGLYZA 5 MG Tabs tablet Generic drug:  saxagliptin HCl Take 5 mg by mouth daily.   PRESCRIPTION MEDICATION every 7 (seven) days. Allergy  Shots   spironolactone 50 MG tablet Commonly known as:  ALDACTONE Take 50 mg by mouth daily.   VENTOLIN HFA 108 (90 Base) MCG/ACT inhaler Generic drug:  albuterol Inhale 2 puffs into the lungs every 6 (six) hours as needed for wheezing or shortness of breath.            Discharge Care Instructions  (From admission, onward)        Start     Ordered   01/07/17 0000  Discharge wound care:    Comments:  May shower today; keep clean band aid on Rt neck site daily x 1 week   01/07/17 4627     Follow-up Information    Jacqulynn Cadet, MD Follow up in 6 week(s).   Specialties:  Interventional Radiology, Radiology Why:  scheduler will call  pt with follow up time and date---call [phone] if questions or concerns Contact information: [place] [address] [phone]            Electronically Signed: Monia Sabal A, PA-C 01/07/2017, 9:56 AM   I have spent Greater Than 30 Minutes discharging Cynthia Long.

## 2017-01-07 NOTE — Progress Notes (Signed)
Patient ID: Cynthia Long, female   DOB: 10-24-1940, 76 y.o.   MRN: [id number]   Transjugular Intrahepatic portal system shunt placed 11/5 in IR with Dr Laurence Ferrari  Doing well this am Slept ok Eating graham crackers--denies N/V Up in chair at bedside Neck site is NT Minimal ecchymotic appearing No hematoma  Will discuss with Dr Laurence Ferrari plan for possible dc today

## 2017-01-08 LAB — TYPE AND SCREEN
ABO/RH(D): A POS
Antibody Screen: POSITIVE
DONOR AG TYPE: NEGATIVE
Donor AG Type: NEGATIVE
PT AG Type: NEGATIVE
Unit division: 0
Unit division: 0

## 2017-01-08 LAB — BPAM RBC
BLOOD PRODUCT EXPIRATION DATE: 201811142359
Blood Product Expiration Date: 201811142359
ISSUE DATE / TIME: 201811051522
ISSUE DATE / TIME: 201811051522
UNIT TYPE AND RH: 6200
Unit Type and Rh: 6200

## 2017-01-13 ENCOUNTER — Telehealth: Payer: Self-pay | Admitting: Internal Medicine

## 2017-01-13 DIAGNOSIS — R188 Other ascites: Secondary | ICD-10-CM

## 2017-01-13 MED ORDER — ONDANSETRON 4 MG PO TBDP: 4.0000 mg | ORAL_TABLET | Freq: Four times a day (QID) | ORAL | 0 refills | Status: AC | PRN

## 2017-01-13 NOTE — Telephone Encounter (Signed)
rx sent and Elta [place] notified

## 2017-01-13 NOTE — Telephone Encounter (Signed)
Patient has been scheduled for paracentesis at [facility] for 01/15/17 1:15.  Patient notified to arrive at 1:00 pm

## 2017-01-13 NOTE — Telephone Encounter (Signed)
That's ok to do - I would notify Dr. Catha Brow in IR so he is aware  They might want to [place] the TIPs stent to make sure it has not occluded as I would have expected the ascites to be better  Could be that she got IVF's and that did it but not sure  If she does not hava a f/u w/ me or [place] we should set something up for Late Nov or Dec - could squeeze in or use an APP

## 2017-01-13 NOTE — Telephone Encounter (Signed)
Patient's brother- in- law requesting zofran ODT since TIPS she has had excessive nausea.  He reports she is sore from the procedure and "maybe a little dehydrated, nauseated, but otherwise is dong well".  Please advise if ok to send in rx?

## 2017-01-13 NOTE — Telephone Encounter (Signed)
Left message for patient to call back  

## 2017-01-13 NOTE — Telephone Encounter (Signed)
Patient called and reports that she is needing a paracentesis.  She had TIPS on 01/06/17 and had paracentesis on that day and they removed 7 liters.  She is up to 142 lbs.  She does not have a recent at home weight, but her last weight 11/14/16 was 132 lbs. She is experiencing pedal edema that is new since TIPS.  She does report a fair amount of "abdominal swelling"

## 2017-01-13 NOTE — Telephone Encounter (Signed)
Yes 4 mg 1 every 6 hrs prn # 30 no refill

## 2017-01-14 ENCOUNTER — Inpatient Hospital Stay (HOSPITAL_COMMUNITY)
Admission: EM | Admit: 2017-01-14 | Discharge: 2017-01-18 | DRG: 441 | Disposition: A | Discharge status: Home Health | Payer: Medicare Other | Attending: Family Medicine | Admitting: Family Medicine

## 2017-01-14 ENCOUNTER — Other Ambulatory Visit: Payer: Self-pay

## 2017-01-14 ENCOUNTER — Emergency Department (HOSPITAL_COMMUNITY): Payer: Medicare Other

## 2017-01-14 ENCOUNTER — Encounter (HOSPITAL_COMMUNITY): Payer: Self-pay | Admitting: Emergency Medicine

## 2017-01-14 DIAGNOSIS — K767 Hepatorenal syndrome: Secondary | ICD-10-CM | POA: Diagnosis present

## 2017-01-14 DIAGNOSIS — E8770 Fluid overload, unspecified: Secondary | ICD-10-CM | POA: Diagnosis not present

## 2017-01-14 DIAGNOSIS — R410 Disorientation, unspecified: Secondary | ICD-10-CM

## 2017-01-14 DIAGNOSIS — R945 Abnormal results of liver function studies: Secondary | ICD-10-CM | POA: Diagnosis not present

## 2017-01-14 DIAGNOSIS — Z95828 Presence of other vascular implants and grafts: Secondary | ICD-10-CM | POA: Diagnosis not present

## 2017-01-14 DIAGNOSIS — K8020 Calculus of gallbladder without cholecystitis without obstruction: Secondary | ICD-10-CM | POA: Diagnosis present

## 2017-01-14 DIAGNOSIS — R7401 Elevation of levels of liver transaminase levels: Secondary | ICD-10-CM

## 2017-01-14 DIAGNOSIS — K7460 Unspecified cirrhosis of liver: Secondary | ICD-10-CM | POA: Diagnosis present

## 2017-01-14 DIAGNOSIS — R2681 Unsteadiness on feet: Secondary | ICD-10-CM | POA: Diagnosis present

## 2017-01-14 DIAGNOSIS — E11649 Type 2 diabetes mellitus with hypoglycemia without coma: Secondary | ICD-10-CM | POA: Diagnosis present

## 2017-01-14 DIAGNOSIS — Z803 Family history of malignant neoplasm of breast: Secondary | ICD-10-CM

## 2017-01-14 DIAGNOSIS — Z7984 Long term (current) use of oral hypoglycemic drugs: Secondary | ICD-10-CM

## 2017-01-14 DIAGNOSIS — I050 Rheumatic mitral stenosis: Secondary | ICD-10-CM | POA: Diagnosis not present

## 2017-01-14 DIAGNOSIS — R188 Other ascites: Secondary | ICD-10-CM | POA: Diagnosis present

## 2017-01-14 DIAGNOSIS — E1100 Type 2 diabetes mellitus with hyperosmolarity without nonketotic hyperglycemic-hyperosmolar coma (NKHHC): Secondary | ICD-10-CM | POA: Diagnosis not present

## 2017-01-14 DIAGNOSIS — I10 Essential (primary) hypertension: Secondary | ICD-10-CM | POA: Diagnosis present

## 2017-01-14 DIAGNOSIS — D696 Thrombocytopenia, unspecified: Secondary | ICD-10-CM | POA: Diagnosis not present

## 2017-01-14 DIAGNOSIS — Z86010 Personal history of colonic polyps: Secondary | ICD-10-CM

## 2017-01-14 DIAGNOSIS — K7581 Nonalcoholic steatohepatitis (NASH): Secondary | ICD-10-CM | POA: Diagnosis not present

## 2017-01-14 DIAGNOSIS — R740 Nonspecific elevation of levels of transaminase and lactic acid dehydrogenase [LDH]: Secondary | ICD-10-CM | POA: Diagnosis not present

## 2017-01-14 DIAGNOSIS — K429 Umbilical hernia without obstruction or gangrene: Secondary | ICD-10-CM | POA: Diagnosis not present

## 2017-01-14 DIAGNOSIS — D6959 Other secondary thrombocytopenia: Secondary | ICD-10-CM | POA: Diagnosis not present

## 2017-01-14 DIAGNOSIS — D649 Anemia, unspecified: Secondary | ICD-10-CM | POA: Diagnosis present

## 2017-01-14 DIAGNOSIS — R011 Cardiac murmur, unspecified: Secondary | ICD-10-CM | POA: Diagnosis present

## 2017-01-14 DIAGNOSIS — Z79899 Other long term (current) drug therapy: Secondary | ICD-10-CM

## 2017-01-14 DIAGNOSIS — R1010 Upper abdominal pain, unspecified: Secondary | ICD-10-CM | POA: Diagnosis not present

## 2017-01-14 DIAGNOSIS — I8500 Esophageal varices without bleeding: Secondary | ICD-10-CM | POA: Diagnosis present

## 2017-01-14 DIAGNOSIS — Z90710 Acquired absence of both cervix and uterus: Secondary | ICD-10-CM

## 2017-01-14 DIAGNOSIS — K766 Portal hypertension: Secondary | ICD-10-CM | POA: Diagnosis present

## 2017-01-14 DIAGNOSIS — N289 Disorder of kidney and ureter, unspecified: Secondary | ICD-10-CM | POA: Diagnosis not present

## 2017-01-14 DIAGNOSIS — Z66 Do not resuscitate: Secondary | ICD-10-CM | POA: Diagnosis not present

## 2017-01-14 DIAGNOSIS — K5900 Constipation, unspecified: Secondary | ICD-10-CM | POA: Diagnosis not present

## 2017-01-14 DIAGNOSIS — Z881 Allergy status to other antibiotic agents status: Secondary | ICD-10-CM

## 2017-01-14 DIAGNOSIS — R748 Abnormal levels of other serum enzymes: Secondary | ICD-10-CM | POA: Diagnosis not present

## 2017-01-14 DIAGNOSIS — Z882 Allergy status to sulfonamides status: Secondary | ICD-10-CM

## 2017-01-14 DIAGNOSIS — K7200 Acute and subacute hepatic failure without coma: Principal | ICD-10-CM | POA: Diagnosis present

## 2017-01-14 DIAGNOSIS — Z87891 Personal history of nicotine dependence: Secondary | ICD-10-CM

## 2017-01-14 DIAGNOSIS — E871 Hypo-osmolality and hyponatremia: Secondary | ICD-10-CM | POA: Diagnosis present

## 2017-01-14 DIAGNOSIS — N179 Acute kidney failure, unspecified: Secondary | ICD-10-CM | POA: Diagnosis present

## 2017-01-14 DIAGNOSIS — K7290 Hepatic failure, unspecified without coma: Secondary | ICD-10-CM | POA: Diagnosis not present

## 2017-01-14 DIAGNOSIS — Z7951 Long term (current) use of inhaled steroids: Secondary | ICD-10-CM

## 2017-01-14 DIAGNOSIS — D509 Iron deficiency anemia, unspecified: Secondary | ICD-10-CM | POA: Diagnosis not present

## 2017-01-14 DIAGNOSIS — I959 Hypotension, unspecified: Secondary | ICD-10-CM | POA: Diagnosis not present

## 2017-01-14 LAB — COMPREHENSIVE METABOLIC PANEL WITH GFR
ALT: 213 U/L — ABNORMAL HIGH (ref 14–54)
AST: 118 U/L — ABNORMAL HIGH (ref 15–41)
Albumin: 3 g/dL — ABNORMAL LOW (ref 3.5–5.0)
Alkaline Phosphatase: 349 U/L — ABNORMAL HIGH (ref 38–126)
Anion gap: 10 (ref 5–15)
BUN: 24 mg/dL — ABNORMAL HIGH (ref 6–20)
CO2: 20 mmol/L — ABNORMAL LOW (ref 22–32)
Calcium: 8.7 mg/dL — ABNORMAL LOW (ref 8.9–10.3)
Chloride: 93 mmol/L — ABNORMAL LOW (ref 101–111)
Creatinine, Ser: 1.16 mg/dL — ABNORMAL HIGH (ref 0.44–1.00)
GFR calc Af Amer: 53 mL/min — ABNORMAL LOW
GFR calc non Af Amer: 46 mL/min — ABNORMAL LOW
Glucose, Bld: 110 mg/dL — ABNORMAL HIGH (ref 65–99)
Potassium: 4.5 mmol/L (ref 3.5–5.1)
Sodium: 123 mmol/L — ABNORMAL LOW (ref 135–145)
Total Bilirubin: 7.2 mg/dL — ABNORMAL HIGH (ref 0.3–1.2)
Total Protein: 5.9 g/dL — ABNORMAL LOW (ref 6.5–8.1)

## 2017-01-14 LAB — CBC WITH DIFFERENTIAL/PLATELET
BASOS ABS: 0 10*3/uL (ref 0.0–0.1)
BASOS PCT: 0 %
EOS ABS: 0 10*3/uL (ref 0.0–0.7)
Eosinophils Relative: 0 %
HEMATOCRIT: 36.3 % (ref 36.0–46.0)
HEMOGLOBIN: 13 g/dL (ref 12.0–15.0)
Lymphocytes Relative: 9 %
Lymphs Abs: 1 10*3/uL (ref 0.7–4.0)
MCH: 31.5 pg (ref 26.0–34.0)
MCHC: 35.8 g/dL (ref 30.0–36.0)
MCV: 87.9 fL (ref 78.0–100.0)
Monocytes Absolute: 1 10*3/uL (ref 0.1–1.0)
Monocytes Relative: 9 %
NEUTROS ABS: 9.2 10*3/uL — AB (ref 1.7–7.7)
NEUTROS PCT: 82 %
Platelets: 86 10*3/uL — ABNORMAL LOW (ref 150–400)
RBC: 4.13 MIL/uL (ref 3.87–5.11)
RDW: 19 % — AB (ref 11.5–15.5)
WBC: 11.2 10*3/uL — AB (ref 4.0–10.5)

## 2017-01-14 LAB — URINALYSIS, ROUTINE W REFLEX MICROSCOPIC
Bilirubin Urine: NEGATIVE
Glucose, UA: NEGATIVE mg/dL
Hgb urine dipstick: NEGATIVE
Ketones, ur: NEGATIVE mg/dL
Leukocytes, UA: NEGATIVE
Nitrite: NEGATIVE
Protein, ur: NEGATIVE mg/dL
Specific Gravity, Urine: 1.005 (ref 1.005–1.030)
pH: 5 (ref 5.0–8.0)

## 2017-01-14 LAB — CBG MONITORING, ED: Glucose-Capillary: 105 mg/dL — ABNORMAL HIGH (ref 65–99)

## 2017-01-14 LAB — PROTIME-INR
INR: 1.18
Prothrombin Time: 14.9 s (ref 11.4–15.2)

## 2017-01-14 LAB — AMMONIA: Ammonia: 32 umol/L (ref 9–35)

## 2017-01-14 MED ORDER — SODIUM CHLORIDE 0.9% FLUSH
3.0000 mL | Freq: Two times a day (BID) | INTRAVENOUS | Status: DC
  Administered 2017-01-14 – 2017-01-18 (×3): 3 mL via INTRAVENOUS

## 2017-01-14 MED ORDER — ONDANSETRON HCL 4 MG/2ML IJ SOLN: 4.0000 mg | Freq: Four times a day (QID) | INTRAMUSCULAR | Status: DC | PRN

## 2017-01-14 MED ORDER — ALBUTEROL SULFATE (2.5 MG/3ML) 0.083% IN NEBU: 3.0000 mL | INHALATION_SOLUTION | Freq: Four times a day (QID) | RESPIRATORY_TRACT | Status: DC | PRN

## 2017-01-14 MED ORDER — BUDESONIDE 0.5 MG/2ML IN SUSP: 0.5000 mg | Freq: Every day | RESPIRATORY_TRACT | Status: DC | PRN

## 2017-01-14 MED ORDER — FUROSEMIDE 40 MG PO TABS
40.0000 mg | ORAL_TABLET | Freq: Every day | ORAL | Status: DC
  Administered 2017-01-15 – 2017-01-16 (×2): 40 mg via ORAL
  Filled 2017-01-14 (×2): qty 1

## 2017-01-14 MED ORDER — SODIUM CHLORIDE 0.9 % IV SOLN: 250.0000 mL | INTRAVENOUS | Status: DC | PRN

## 2017-01-14 MED ORDER — DILTIAZEM HCL ER COATED BEADS 120 MG PO CP24
120.0000 mg | ORAL_CAPSULE | Freq: Every day | ORAL | Status: DC
  Administered 2017-01-15 – 2017-01-18 (×4): 120 mg via ORAL
  Filled 2017-01-14 (×4): qty 1

## 2017-01-14 MED ORDER — LACTULOSE 10 GM/15ML PO SOLN
20.0000 g | Freq: Every day | ORAL | Status: DC
  Administered 2017-01-15 – 2017-01-16 (×2): 20 g via ORAL
  Filled 2017-01-14 (×2): qty 30

## 2017-01-14 MED ORDER — SODIUM CHLORIDE 0.9% FLUSH: 3.0000 mL | INTRAVENOUS | Status: DC | PRN

## 2017-01-14 MED ORDER — DILTIAZEM HCL ER 120 MG PO CP24: 120.0000 mg | ORAL_CAPSULE | Freq: Every day | ORAL | Status: DC

## 2017-01-14 MED ORDER — ONDANSETRON HCL 4 MG PO TABS: 4.0000 mg | ORAL_TABLET | Freq: Four times a day (QID) | ORAL | Status: DC | PRN

## 2017-01-14 MED ORDER — ALBUMIN HUMAN 25 % IV SOLN
25.0000 g | Freq: Once | INTRAVENOUS | Status: AC
  Administered 2017-01-15: 25 g via INTRAVENOUS
  Filled 2017-01-14: qty 100

## 2017-01-14 MED ORDER — INSULIN ASPART 100 UNIT/ML ~~LOC~~ SOLN
0.0000 [IU] | Freq: Three times a day (TID) | SUBCUTANEOUS | Status: DC
  Administered 2017-01-16 – 2017-01-17 (×2): 1 [IU] via SUBCUTANEOUS
  Administered 2017-01-17: 3 [IU] via SUBCUTANEOUS
  Administered 2017-01-18: 1 [IU] via SUBCUTANEOUS

## 2017-01-14 MED ORDER — SPIRONOLACTONE 50 MG PO TABS
50.0000 mg | ORAL_TABLET | Freq: Every day | ORAL | Status: DC
  Administered 2017-01-15 – 2017-01-16 (×2): 50 mg via ORAL
  Filled 2017-01-14 (×2): qty 1

## 2017-01-14 NOTE — ED Notes (Signed)
Called [facility] with bed assignment and for transport to [facility]. 

## 2017-01-14 NOTE — ED Triage Notes (Signed)
Pt had a Tips procedure 01/06/17, on Friday began having severe N/. Daughter states pt is more confused than normal, is scheduled to have a paracentesis tomorrow. Pt answers questions appropriately. Daughter states she is worried about possible infection. Denies fever at home.

## 2017-01-14 NOTE — H&P (Addendum)
TRH H&P    Patient Demographics:    Cynthia Long, is a 76 y.o. female  MRN: [id number]  DOB - [birth year]  Admit Date - 01/14/2017  Referring MD/NP/PA: Dr. Dolly Rias  Outpatient Primary MD for the patient is Ahmed, Otila Back, MD  Patient coming from: Home  Chief Complaint  Patient presents with  . multiple complaints      HPI:    Cynthia Long  is a 76 y.o. female, with history of Nonalcoholic steatohepatitis, diabetes mellitus, gastritis, esophageal varices came to hospital with complaints of fatigue, anorexia.  Patient recently had a TIPS procedure done on Monday, 01/06/2017.  Today came to hospital for further evaluation. In the ED lab work revealed elevated liver enzymes AST 118, ALT 213, alk phos 349, total bilirubin 7.2, ammonia was normal at 32. GI was consulted at [facility].  Dr. Carlean Purl recommends patient to be transferred to [facility] For evaluation of TIPS stent.  She denies diarrhea.,  No vomiting. No chest pain or shortness of breath. Denies fever or dysuria.    Review of systems:     All other systems reviewed and are negative.   With Past History of the following :    Past Medical History:  Diagnosis Date  . Allergy   . Anemia   . ARF (acute renal failure) ([place]) 2015   ?prerenal azotemia- resolved by discharge  . Blood transfusion without reported diagnosis   . Bronchitis, acute   . Cholelithiasis   . Cirrhosis ([place]) 08/2013   likely from NASH  . Diabetes mellitus without complication ([place])   . Dyspnea    when fluid builds up in abdomen  . Dysrhythmia   . Esophageal varices ([place])   . Gastritis   . GI bleed 08/2013, 10/2013   EGD negative 08/2013.   Marland Kitchen Headache(784.0)   . Heart murmur   . Hypertension   . Obesity   . Pertussis 2011  . Tubular adenoma of colon   . Uncontrolled type 2 diabetes mellitus , without long-term current use of insulin ([place]) 11/15/2016       Past Surgical History:  Procedure Laterality Date  . COLONOSCOPY W/ BIOPSIES    . IR PARACENTESIS  01/02/2017  . IR PARACENTESIS  01/06/2017  . IR RADIOLOGIST EVAL & MGMT  12/11/2016  . IR TIPS  01/06/2017  . VAGINAL HYSTERECTOMY     partial, for prolapsed uterus.      Social History:      Social History   Tobacco Use  . Smoking status: Former Smoker    Years: 1.00    Types: Cigarettes    Last attempt to quit: 10/08/1961    Years since quitting: 55.3  . Smokeless tobacco: Never Used  . Tobacco comment: as a teenager-   Substance Use Topics  . Alcohol use: No       Family History :     Family History  Problem Relation Age of Onset  . Breast cancer Daughter   . Stomach cancer Neg Hx   . Colon  cancer Neg Hx       Home Medications:   Prior to Admission medications   Medication Sig Start Date End Date Taking? Authorizing Provider  acetaminophen (TYLENOL) 325 MG tablet Take 325 mg by mouth every 6 (six) hours as needed for mild pain or headache.   Yes [provider]  budesonide (PULMICORT) 0.5 MG/2ML nebulizer solution Take 0.5 mg by nebulization daily as needed (for shortness of breath).   Yes [provider]  CALCIUM LACTATE PO Take 6 tablets by mouth every evening.   Yes [provider]  diltiazem (CARDIZEM CD) 120 MG 24 hr capsule Take 1 capsule daily by mouth. 11/05/16  Yes [provider]  furosemide (LASIX) 40 MG tablet Take 40 mg by mouth daily.  11/11/16  Yes [provider]  glimepiride (AMARYL) 4 MG tablet Take 4 mg by mouth daily with breakfast.    Yes [provider]  lactulose (CHRONULAC) 10 GM/15ML solution Take 30-40 mLs daily by mouth. 01/08/17  Yes [provider]  Lancets (ONETOUCH ULTRASOFT) lancets 1 each by Other route 2 (two) times daily.  04/23/14  Yes [provider]  ondansetron (ZOFRAN ODT) 4 MG disintegrating tablet Take 1 tablet (4 mg total) every 6 (six) hours as needed by  mouth for nausea or vomiting. 01/13/17  Yes Gatha Mayer, MD  PRESCRIPTION MEDICATION every 7 (seven) days. Allergy Shots    Yes [provider]  saxagliptin HCl (ONGLYZA) 5 MG TABS tablet Take 5 mg by mouth daily.   Yes [provider]  spironolactone (ALDACTONE) 50 MG tablet Take 50 mg by mouth daily.  11/11/16  Yes [provider]  VENTOLIN HFA 108 (90 Base) MCG/ACT inhaler Inhale 2 puffs into the lungs every 6 (six) hours as needed for wheezing or shortness of breath.  10/14/16  Yes [provider]  diltiazem (DILACOR XR) 120 MG 24 hr capsule Take 120 mg by mouth daily.    [provider]     Allergies:     Allergies  Allergen Reactions  . Biaxin [Clarithromycin]     Liver enzymes elevated per pt  . Sulfa Antibiotics Diarrhea     Physical Exam:   Vitals  Blood pressure (!) 119/50, pulse 67, temperature 97.7 F (36.5 C), temperature source Oral, resp. rate 15, height 5' 2"  (1.575 m), weight 61.2 kg (135 lb), SpO2 97 %.  1.  General: Appears in no acute distress  2. Psychiatric:  Intact judgement and  insight, awake alert, oriented x 3.  3. Neurologic: No focal neurological deficits, all cranial nerves intact.Strength 5/5 all 4 extremities, sensation intact all 4 extremities, plantars down going.  4. Eyes :  anicteric sclerae, moist conjunctivae with no lid lag. PERRLA.  5. ENMT:  Oropharynx clear with moist mucous membranes and good dentition  6. Neck:  supple, no cervical lymphadenopathy appriciated, No thyromegaly  7. Respiratory : Normal respiratory effort, good air movement bilaterally,clear to  auscultation bilaterally  8. Cardiovascular : RRR, grade 4/6 ejection systolic murmur auscultated at the mitral area, bilateral 1+ pitting edema of the lower extremities.  9. Gastrointestinal:  Positive bowel sounds, abdomen soft, distended, nontender to palpation.  10. Skin:  No cyanosis, normal texture and turgor, no  rash, lesions or ulcers  11.Musculoskeletal:  Good muscle tone,  joints appear normal , no effusions,  normal range of motion    Data Review:    CBC Recent Labs  Lab 01/14/17 1344  WBC 11.2*  HGB 13.0  HCT 36.3  PLT 86*  MCV 87.9  MCH 31.5  MCHC 35.8  RDW 19.0*  LYMPHSABS 1.0  MONOABS 1.0  EOSABS 0.0  BASOSABS 0.0   ------------------------------------------------------------------------------------------------------------------  Chemistries  Recent Labs  Lab 01/14/17 1344  NA 123*  K 4.5  CL 93*  CO2 20*  GLUCOSE 110*  BUN 24*  CREATININE 1.16*  CALCIUM 8.7*  AST 118*  ALT 213*  ALKPHOS 349*  BILITOT 7.2*   ------------------------------------------------------------------------------------------------------------------  ------------------------------------------------------------------------------------------------------------------ GFR: Estimated Creatinine Clearance: 37.2 mL/min (A) (by C-G formula based on SCr of 1.16 mg/dL (H)). Liver Function Tests: Recent Labs  Lab 01/14/17 1344  AST 118*  ALT 213*  ALKPHOS 349*  BILITOT 7.2*  PROT 5.9*  ALBUMIN 3.0*   No results for input(s): LIPASE, AMYLASE in the last 168 hours. Recent Labs  Lab 01/14/17 1432  AMMONIA 32   Coagulation Profile: Recent Labs  Lab 01/14/17 1344  INR 1.18   CBG: Recent Labs  Lab 01/14/17 1322  GLUCAP 105*    --------------------------------------------------------------------------------------------------------------- Urine analysis:    Component Value Date/Time   COLORURINE YELLOW 01/14/2017 1430   APPEARANCEUR CLEAR 01/14/2017 1430   LABSPEC 1.005 01/14/2017 1430   PHURINE 5.0 01/14/2017 1430   GLUCOSEU NEGATIVE 01/14/2017 1430   HGBUR NEGATIVE 01/14/2017 1430   BILIRUBINUR NEGATIVE 01/14/2017 1430   KETONESUR NEGATIVE 01/14/2017 1430   PROTEINUR NEGATIVE 01/14/2017 1430   UROBILINOGEN 0.2 10/08/2013 1746   NITRITE NEGATIVE 01/14/2017 1430    LEUKOCYTESUR NEGATIVE 01/14/2017 1430      Imaging Results:    Dg Chest 2 View  Result Date: 01/14/2017 CLINICAL DATA:  Nausea and vomiting, history of recent TIPS EXAM: CHEST  2 VIEW COMPARISON:  08/14/2013, 12/17/2016 FINDINGS: Cardiac shadow is mildly prominent. Left pleural effusion with underlying atelectasis/infiltrate is noted. This is new from the most recent exam of 12/17/2016. Mild right basilar atelectasis is noted as well. A small right pleural effusion is also seen. Changes consistent with recent TIPS are noted. IMPRESSION: Bilateral pleural effusions with atelectatic changes left significantly greater than right. Electronically Signed   By: Inez Catalina M.D.   On: 01/14/2017 14:37    My personal review of EKG: Rhythm NSR   Assessment & Plan:    Active Problems:   Ascites   1. Nonalcoholic steato hepatitis-patient presenting with elevated liver enzymes, increased bilirubin, alk phos. we will check abdominal ultrasound in a.m.  GI was consulted by ED physician.  Dr. Carlean Purl is aware of patient's transfer to [facility] has moderate ascites, not causing any symptoms.  She had 7 L of ascitic fluid removed on 01/06/2017.  Will order paracentesis in a.m. per IR.  Will give IV albumin 25%, 25 g x1 after paracentesis in a.m. continue Lasix, Aldactone. 3. Diabetes mellitus-hold oral hypoglycemic agents.  Start sliding scale insulin with NovoLog. 4. Hypertension-blood pressure stable, continue Cardizem. 5. Hyponatremia-sodium 123, likely from ascites/fluid overload.  Hypervolemic hyponatremia from underlying [place]/cirrhosis/ascites.  Check serum sodium level in a.m.  Patient will be transferred to [facility].  Dr. Payton Emerald is the accepting physician  DVT Prophylaxis-    SCDs   AM Labs Ordered, also please review Full Orders  Family Communication: Admission, patients condition and plan of care including tests being ordered have been discussed  with the patient and her daughter at bedside who indicate understanding and agree with the plan and Code Status.  Code Status: DNR  Admission status: Inpatient  Time spent  in minutes : 60 minutes   Bryanda Mikel S M.D on 01/14/2017 at 4:58 PM  Between 7am to 7pm - Pager - [phone]. After 7pm go to [email or web address] - password [facility]  [facility] - Office  [phone]

## 2017-01-14 NOTE — ED Provider Notes (Signed)
Emergency Department Provider Note   I have reviewed the triage vital signs and the nursing notes.   HISTORY  Chief Complaint multiple complaints   HPI Cynthia Long is a 76 y.o. 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  She has 7 L of ascitic fluid removed at that time as well.  Patient was in hospital overnight and went home the next day and did well for the first few days however on Saturday or Friday she started having severe nausea not better with Zofran.  Her fluid started to build back up in her abdomen.  She had periodic episodes of confusion over the weekend as well came here for evaluation today.  No abdominal pain besides the distention.  No urinary symptoms aside from intermittently dark.  She does have a little of cough but no fever or chest pain.  Other associated modifying symptoms.   Past Medical History:  Diagnosis Date  . Allergy   . Anemia   . ARF (acute renal failure) ([place]) 2015   ?prerenal azotemia- resolved by discharge  . Blood transfusion without reported diagnosis   . Bronchitis, acute   . Cholelithiasis   . Cirrhosis ([place]) 08/2013   likely from NASH  . Diabetes mellitus without complication ([place])   . Dyspnea    when fluid builds up in abdomen  . Dysrhythmia   . Esophageal varices ([place])   . Gastritis   . GI bleed 08/2013, 10/2013   EGD negative 08/2013.   Marland Kitchen Headache(784.0)   . Heart murmur   . Hypertension   . Obesity   . Pertussis 2011  . Tubular adenoma of colon   . Uncontrolled type 2 diabetes mellitus , without long-term current use of insulin ([place]) 11/15/2016    Patient Active Problem List   Diagnosis Date Noted  . Liver cirrhosis secondary to NASH ([place]) 01/06/2017  . Abnormal MRI, liver 11/15/2016  . Uncontrolled type 2 diabetes mellitus , without long-term current use of insulin ([place]) 11/15/2016  . Hepatic cirrhosis ([place])  09/15/2013  . Ascites 09/15/2013  . HTN (hypertension) 08/13/2013  . Chronic allergic bronchitis 08/13/2013    Past Surgical History:  Procedure Laterality Date  . COLONOSCOPY W/ BIOPSIES    . IR PARACENTESIS  01/02/2017  . IR PARACENTESIS  01/06/2017  . IR RADIOLOGIST EVAL & MGMT  12/11/2016  . IR TIPS  01/06/2017  . VAGINAL HYSTERECTOMY     partial, for prolapsed uterus.    Current Outpatient Rx  . Order #: [id number] Class: Historical Med  . Order #: [id number] Class: Historical Med  . Order #: [id number] Class: Historical Med  . Order #: [id number] Class: Historical Med  . Order #: [id number] Class: Historical Med  . Order #: [id number] Class: Historical Med  . Order #: [id number] Class: Historical Med  . Order #: [id number] Class: Historical Med  . Order #: [id number] Class: Normal  . Order #: [id number] Class: Historical Med  . Order #: [id number] Class: Historical Med  . Order #: [id number] Class: Historical Med  . Order #: [id number] Class: Historical Med  . Order #: [id number] Class: Historical Med    Allergies Biaxin [clarithromycin] and Sulfa antibiotics  Family History  Problem Relation Age of Onset  . Breast cancer Daughter   . Stomach cancer Neg Hx   . Colon cancer Neg Hx     Social History Social History   Tobacco Use  . Smoking status: Former Smoker    Years:  1.00    Types: Cigarettes    Last attempt to quit: 10/08/1961    Years since quitting: 55.3  . Smokeless tobacco: Never Used  . Tobacco comment: as a teenager-   Substance Use Topics  . Alcohol use: No  . Drug use: No    Review of Systems  All other systems negative except as documented in the HPI. All pertinent positives and negatives as reviewed in the HPI. ____________________________________________   PHYSICAL EXAM:  VITAL SIGNS: ED Triage Vitals [01/14/17 1113]  Enc Vitals Group     BP (!) 121/44     Pulse Rate 66     Resp 18     Temp 97.7 F (36.5 C)     Temp Source Oral     SpO2 97 %     Weight  135 lb (61.2 kg)     Height 5\' 2"  (1.575 m)     Head Circumference      Peak Flow      Pain Score 4     Pain Loc      Pain Edu?      Excl. in [place]?     Constitutional: Alert and oriented. Well appearing and in no acute distress. Eyes: Scleral icterus. PERRL. EOMI. Head: Atraumatic. Nose: No congestion/rhinnorhea. Mouth/Throat: Mucous membranes are moist.  Oropharynx non-erythematous. Neck: No stridor.  No meningeal signs.   Cardiovascular: Normal rate, regular rhythm. Good peripheral circulation. Grossly normal heart sounds.   Respiratory: Normal respiratory effort.  No retractions. Lungs CTAB. Gastrointestinal: Soft and nontender. Mild distention, no peritonitis.  Musculoskeletal: No lower extremity tenderness nor edema. No gross deformities of extremities. Neurologic:  Normal speech and language. No gross focal neurologic deficits are appreciated.  Skin:  Skin is warm, dry and intact. Jaundiced.   ____________________________________________   LABS (all labs ordered are listed, but only abnormal results are displayed)  Labs Reviewed  CBC WITH DIFFERENTIAL/PLATELET - Abnormal; Notable for the following components:      Result Value   WBC 11.2 (*)    RDW 19.0 (*)    Platelets 86 (*)    Neutro Abs 9.2 (*)    All other components within normal limits  COMPREHENSIVE METABOLIC PANEL - Abnormal; Notable for the following components:   Sodium 123 (*)    Chloride 93 (*)    CO2 20 (*)    Glucose, Bld 110 (*)    BUN 24 (*)    Creatinine, Ser 1.16 (*)    Calcium 8.7 (*)    Total Protein 5.9 (*)    Albumin 3.0 (*)    AST 118 (*)    ALT 213 (*)    Alkaline Phosphatase 349 (*)    Total Bilirubin 7.2 (*)    GFR calc non Af Amer 46 (*)    GFR calc Af Amer 53 (*)    All other components within normal limits  CBG MONITORING, ED - Abnormal; Notable for the following components:   Glucose-Capillary 105 (*)    All other components within normal limits  AMMONIA  URINALYSIS, ROUTINE  W REFLEX MICROSCOPIC  PROTIME-INR   ____________________________________________  EKG   EKG Interpretation  Date/Time:  Tuesday January 14 2017 14:56:53 EST Ventricular Rate:  70 PR Interval:    QRS Duration: 99 QT Interval:  429 QTC Calculation: 463 R Axis:   -2 Text Interpretation:  Sinus rhythm Anterior infarct, old No significant change since last tracing Confirmed by Merrily Pew [phone]) on 01/14/2017 4:30:07 PM  ____________________________________________  RADIOLOGY  Dg Chest 2 View  Result Date: 01/14/2017 CLINICAL DATA:  Nausea and vomiting, history of recent TIPS EXAM: CHEST  2 VIEW COMPARISON:  08/14/2013, 12/17/2016 FINDINGS: Cardiac shadow is mildly prominent. Left pleural effusion with underlying atelectasis/infiltrate is noted. This is new from the most recent exam of 12/17/2016. Mild right basilar atelectasis is noted as well. A small right pleural effusion is also seen. Changes consistent with recent TIPS are noted. IMPRESSION: Bilateral pleural effusions with atelectatic changes left significantly greater than right. Electronically Signed   By: Inez Catalina M.D.   On: 01/14/2017 14:37    ____________________________________________   PROCEDURES  Procedure(s) performed:   Procedures   ____________________________________________   INITIAL IMPRESSION / ASSESSMENT AND PLAN / ED COURSE  Pertinent labs & imaging results that were available during my care of the patient were reviewed by me and considered in my medical decision making (see chart for details).  Suspect acute liver failure and possible hepatic encephalopathy.  Will check appropriate labs.  I discussed with Dr. hand with interventional radiology and no indication for any imaging at this time and no indication for emergent paracentesis either.  After labs revealed elevated liver enzymes and bilirubin I discussed with Dr. Carlean Purl at [facility] gastroenterology who recommended admission to  [facility] so that they could help take care of her, and in case of deterioration she would be there for intervention.  ____________________________________________  FINAL CLINICAL IMPRESSION(S) / ED DIAGNOSES  Final diagnoses:  Hyperbilirubinemia  Confusion  Elevated liver enzymes  AKI (acute kidney injury) ([place])    MEDICATIONS GIVEN DURING THIS VISIT:  Medications - No data to display   NEW OUTPATIENT MEDICATIONS STARTED DURING THIS VISIT:  This SmartLink is deprecated. Use AVSMEDLIST instead to display the medication list for a patient.  Note:  This document was prepared using Dragon voice recognition software and may include unintentional dictation errors.   Merrily Pew, MD 01/14/17 [phone]

## 2017-01-14 NOTE — Progress Notes (Signed)
Received patient transfer from [facility] via [place]. Patient AOx4, VS stable, O2Sat at 99% on RA, denies and pain.  Patient oriented to room, bed controls and call light.  Patient now resting on bed watching TV.  Admission RN text paged for info.  Will monitor.

## 2017-01-15 ENCOUNTER — Inpatient Hospital Stay (HOSPITAL_COMMUNITY): Payer: Medicare Other

## 2017-01-15 ENCOUNTER — Ambulatory Visit (HOSPITAL_COMMUNITY): Admission: RE | Admit: 2017-01-15 | Payer: Medicare Other | Source: Ambulatory Visit

## 2017-01-15 DIAGNOSIS — K7581 Nonalcoholic steatohepatitis (NASH): Secondary | ICD-10-CM

## 2017-01-15 DIAGNOSIS — D696 Thrombocytopenia, unspecified: Secondary | ICD-10-CM

## 2017-01-15 DIAGNOSIS — K7290 Hepatic failure, unspecified without coma: Secondary | ICD-10-CM

## 2017-01-15 DIAGNOSIS — Z95828 Presence of other vascular implants and grafts: Secondary | ICD-10-CM

## 2017-01-15 DIAGNOSIS — N289 Disorder of kidney and ureter, unspecified: Secondary | ICD-10-CM

## 2017-01-15 DIAGNOSIS — R945 Abnormal results of liver function studies: Secondary | ICD-10-CM

## 2017-01-15 DIAGNOSIS — N179 Acute kidney failure, unspecified: Secondary | ICD-10-CM

## 2017-01-15 DIAGNOSIS — K7460 Unspecified cirrhosis of liver: Secondary | ICD-10-CM

## 2017-01-15 DIAGNOSIS — R740 Nonspecific elevation of levels of transaminase and lactic acid dehydrogenase [LDH]: Secondary | ICD-10-CM

## 2017-01-15 DIAGNOSIS — E1100 Type 2 diabetes mellitus with hyperosmolarity without nonketotic hyperglycemic-hyperosmolar coma (NKHHC): Secondary | ICD-10-CM

## 2017-01-15 DIAGNOSIS — D509 Iron deficiency anemia, unspecified: Secondary | ICD-10-CM

## 2017-01-15 LAB — COMPREHENSIVE METABOLIC PANEL
ALT: 142 U/L — ABNORMAL HIGH (ref 14–54)
ANION GAP: 7 (ref 5–15)
AST: 86 U/L — ABNORMAL HIGH (ref 15–41)
Albumin: 2.2 g/dL — ABNORMAL LOW (ref 3.5–5.0)
Alkaline Phosphatase: 280 U/L — ABNORMAL HIGH (ref 38–126)
BUN: 17 mg/dL (ref 6–20)
CHLORIDE: 99 mmol/L — AB (ref 101–111)
CO2: 21 mmol/L — AB (ref 22–32)
Calcium: 8.2 mg/dL — ABNORMAL LOW (ref 8.9–10.3)
Creatinine, Ser: 1.12 mg/dL — ABNORMAL HIGH (ref 0.44–1.00)
GFR calc non Af Amer: 48 mL/min — ABNORMAL LOW (ref >60)
GFR, EST AFRICAN AMERICAN: 55 mL/min — AB (ref >60)
Glucose, Bld: 166 mg/dL — ABNORMAL HIGH (ref 65–99)
Potassium: 3.9 mmol/L (ref 3.5–5.1)
SODIUM: 127 mmol/L — AB (ref 135–145)
Total Bilirubin: 5.7 mg/dL — ABNORMAL HIGH (ref 0.3–1.2)
Total Protein: 4.4 g/dL — ABNORMAL LOW (ref 6.5–8.1)

## 2017-01-15 LAB — HEMOGLOBIN A1C
HEMOGLOBIN A1C: 5.4 % (ref 4.8–5.6)
MEAN PLASMA GLUCOSE: 108.28 mg/dL

## 2017-01-15 LAB — GLUCOSE, CAPILLARY
GLUCOSE-CAPILLARY: 48 mg/dL — AB (ref 65–99)
GLUCOSE-CAPILLARY: 111 mg/dL — AB (ref 65–99)
GLUCOSE-CAPILLARY: 75 mg/dL (ref 65–99)
Glucose-Capillary: 102 mg/dL — ABNORMAL HIGH (ref 65–99)
Glucose-Capillary: 197 mg/dL — ABNORMAL HIGH (ref 65–99)
Glucose-Capillary: 53 mg/dL — ABNORMAL LOW (ref 65–99)
Glucose-Capillary: 82 mg/dL (ref 65–99)

## 2017-01-15 LAB — CBC
HCT: 29.5 % — ABNORMAL LOW (ref 36.0–46.0)
Hemoglobin: 10.4 g/dL — ABNORMAL LOW (ref 12.0–15.0)
MCH: 30.9 pg (ref 26.0–34.0)
MCHC: 35.3 g/dL (ref 30.0–36.0)
MCV: 87.5 fL (ref 78.0–100.0)
PLATELETS: 53 10*3/uL — AB (ref 150–400)
RBC: 3.37 MIL/uL — ABNORMAL LOW (ref 3.87–5.11)
RDW: 19.3 % — AB (ref 11.5–15.5)
WBC: 6.7 10*3/uL (ref 4.0–10.5)

## 2017-01-15 MED ORDER — TRAMADOL HCL 50 MG PO TABS
50.0000 mg | ORAL_TABLET | Freq: Two times a day (BID) | ORAL | Status: DC | PRN
  Administered 2017-01-15 – 2017-01-16 (×2): 50 mg via ORAL
  Filled 2017-01-15 (×2): qty 1

## 2017-01-15 MED ORDER — LIDOCAINE HCL (PF) 1 % IJ SOLN
INTRAMUSCULAR | Status: AC
  Filled 2017-01-15: qty 10

## 2017-01-15 MED ORDER — DEXTROSE 50 % IV SOLN
INTRAVENOUS | Status: AC
  Administered 2017-01-15: 50 mL
  Filled 2017-01-15: qty 50

## 2017-01-15 NOTE — Procedures (Signed)
Ultrasound-guided therapeutic paracentesis performed yielding 4.4 liters of icteric colored fluid. No immediate complications.  Cynthia Long E 3:06 PM 01/15/2017

## 2017-01-15 NOTE — Progress Notes (Signed)
Inpatient Diabetes Program Recommendations  AACE/ADA: New Consensus Statement on Inpatient Glycemic Control (2015)  Target Ranges:  Prepandial:   less than 140 mg/dL      Peak postprandial:   less than 180 mg/dL (1-2 hours)      Critically ill patients:  140 - 180 mg/dL   Lab Results  Component Value Date   GLUCAP 102 (H) 01/15/2017   HGBA1C 5.4 01/15/2017    Review of Glycemic ControlResults for TOBI, GROESBECK (MRN [id number]) as of 01/15/2017 16:07  Ref. Range 01/15/2017 00:52 01/15/2017 04:38 01/15/2017 05:03 01/15/2017 06:42 01/15/2017 14:00  Glucose-Capillary Latest Ref Range: 65 - 99 mg/dL 82 48 (L) 197 (H) 111 (H) 102 (H)    Diabetes history: DM 2 Outpatient Diabetes medications: Onglyza 5 mg daily, Amaryl 4 mg daily Current orders for Inpatient glycemic control:  Novolog sensitive tid with meals  Inpatient Diabetes Program Recommendations:    Note low blood sugars.  A1C has dropped from 7.8% to 5.4%.  It appears that patient may be having low blood sugars at home as well.  Please consider d/c of Amaryl 4 mg from home meds due to risk for hypoglycemia.  May not need Onglyza either.  Will follow and speak to patient on 01/16/17.   Thanks,  Adah Perl, RN, BC-ADM Inpatient Diabetes Coordinator Pager [phone] (8a-5p)

## 2017-01-15 NOTE — Progress Notes (Signed)
Patient ID: Cynthia Long, female   DOB: 16-Mar-1940, 76 y.o.   MRN: [id number]    Referring Physician(s): Dr. Silvano Rusk  Supervising Physician: Daryll Brod  Patient Status: [facility] - In-pt  Chief Complaint: Nausea, transaminitis  Subjective: Patient is s/p TIPS last Monday by Dr. Laurence Ferrari.  Several days after being home she developed fatigue and nausea.  She went to the ED at [facility] yesterday and was found to have hyperbilirubinemia as well as transaminitis.  She was transferred to [facility] for further care by GI and IR.  Allergies: Biaxin [clarithromycin] and Sulfa antibiotics  Medications: Prior to Admission medications   Medication Sig Start Date End Date Taking? Authorizing Provider  acetaminophen (TYLENOL) 325 MG tablet Take 325 mg by mouth every 6 (six) hours as needed for mild pain or headache.   Yes [provider]  budesonide (PULMICORT) 0.5 MG/2ML nebulizer solution Take 0.5 mg by nebulization daily as needed (for shortness of breath).   Yes [provider]  CALCIUM LACTATE PO Take 6 tablets by mouth every evening.   Yes [provider]  diltiazem (CARDIZEM CD) 120 MG 24 hr capsule Take 1 capsule daily by mouth. 11/05/16  Yes [provider]  furosemide (LASIX) 40 MG tablet Take 40 mg by mouth daily.  11/11/16  Yes [provider]  glimepiride (AMARYL) 4 MG tablet Take 4 mg by mouth daily with breakfast.    Yes [provider]  lactulose (CHRONULAC) 10 GM/15ML solution Take 30-40 mLs daily by mouth. 01/08/17  Yes [provider]  Lancets (ONETOUCH ULTRASOFT) lancets 1 each by Other route 2 (two) times daily.  04/23/14  Yes [provider]  ondansetron (ZOFRAN ODT) 4 MG disintegrating tablet Take 1 tablet (4 mg total) every 6 (six) hours as needed by mouth for nausea or vomiting. 01/13/17  Yes Gatha Mayer, MD  PRESCRIPTION MEDICATION every 7 (seven) days. Allergy Shots    Yes [provider]  saxagliptin  HCl (ONGLYZA) 5 MG TABS tablet Take 5 mg by mouth daily.   Yes [provider]  spironolactone (ALDACTONE) 50 MG tablet Take 50 mg by mouth daily.  11/11/16  Yes [provider]  VENTOLIN HFA 108 (90 Base) MCG/ACT inhaler Inhale 2 puffs into the lungs every 6 (six) hours as needed for wheezing or shortness of breath.  10/14/16  Yes [provider]  diltiazem (DILACOR XR) 120 MG 24 hr capsule Take 120 mg by mouth daily.    [provider]    Vital Signs: BP (!) 132/42 (BP Location: Left Arm)   Pulse 78   Temp 97.6 F (36.4 C) (Oral)   Resp 16   Ht 5\' 2"  (1.575 m)   Wt 135 lb (61.2 kg)   SpO2 94%   BMI 24.69 kg/m   Physical Exam: Abd: soft, doughy, likely with some ascites, nontender  Imaging: Dg Chest 2 View  Result Date: 01/14/2017 CLINICAL DATA:  Nausea and vomiting, history of recent TIPS EXAM: CHEST  2 VIEW COMPARISON:  08/14/2013, 12/17/2016 FINDINGS: Cardiac shadow is mildly prominent. Left pleural effusion with underlying atelectasis/infiltrate is noted. This is new from the most recent exam of 12/17/2016. Mild right basilar atelectasis is noted as well. A small right pleural effusion is also seen. Changes consistent with recent TIPS are noted. IMPRESSION: Bilateral pleural effusions with atelectatic changes left significantly greater than right. Electronically Signed   By: Inez Catalina M.D.   On: 01/14/2017 14:37    Labs:  CBC: Recent Labs    01/06/17 1130 01/07/17 0539 01/14/17 1344 01/15/17 0542  WBC 5.7 8.8 11.2* 6.7  HGB 11.7* 10.1* 13.0 10.4*  HCT 33.7* 29.4* 36.3 29.5*  PLT 95* 95* 86* 53*    COAGS: Recent Labs    11/14/16 1144 12/11/16 1126 01/06/17 1200 01/14/17 1344  INR 1.2* 1.0 1.32 1.18  APTT  --   --  28  --     BMP: Recent Labs    01/06/17 1130 01/07/17 0539 01/14/17 1344 01/15/17 0542  NA 129* 131* 123* 127*  K 3.4* 3.6 4.5 3.9  CL 101 105 93* 99*  CO2 19* 18* 20* 21*  GLUCOSE 85 57* 110* 166*    BUN 19 13 24* 17  CALCIUM 8.2* 7.9* 8.7* 8.2*  CREATININE 0.97 0.96 1.16* 1.12*  GFRNONAA 57* 57* 46* 48*  GFRAA >60 >60 53* 55*    LIVER FUNCTION TESTS: Recent Labs    01/06/17 1130 01/07/17 0539 01/14/17 1344 01/15/17 0542  BILITOT 2.1* 3.0* 7.2* 5.7*  AST 26 130* 118* 86*  ALT 20 89* 213* 142*  ALKPHOS 217* 138* 349* 280*  PROT 5.2* 4.6* 5.9* 4.4*  ALBUMIN 2.5* 2.7* 3.0* 2.2*    Assessment and Plan: 1. S/p TIPS with hyperbilirubinemia and transaminitis  US doppler of the liver has been ordered to evaluate her TIPS to determine what may be the source of her lab abnormality.  She may require a revision or even more pending findings.  We will plan to do a para while she is down for her [place]. We will follow and make further recommendations after [place].  Electronically Signed: Henreitta Cea 01/15/2017, 9:28 AM   I spent a total of 15 Minutes at the the patient's bedside AND on the patient's hospital floor or unit, greater than 50% of which was counseling/coordinating care for transaminitis, s/p TIPS

## 2017-01-15 NOTE — Consult Note (Signed)
[facility] Consult: 1:38 PM 01/15/2017  LOS: 1 day    Referring Provider: Dr Lonny Prude  Primary Care Physician:  Macon Large, MD Primary Gastroenterologist:  Dr. Carlean Purl since 10/2016, previously [facility] >> [facility] in [place] >> [place].       Reason for Consultation:  Liver failure.   HPI: Cynthia Long is a 76 y.o. female.  PMH DM, not on insulin.  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   MRI 12/17/2016: Cirrhosis.  Large ascites.  Cholelithiasis.  Non-specific GB wall edema. No mass S/p TIPS and 7 liter paracentesis 01/06/17 for ascites with intolerance to diuretics and paracentesis.   Received Zosyn before TIPS.  IR notes mention transfusing platelets but these were never given (confirmed with blood bank)  No Albumin was given. She was observed overnight and left the hospital the next day in stable condition.  Dr. Laurence Ferrari initiated lactulose at discharge but patient only took 1 dose of this at home.  Home diuretics: lasix 75m, aldactone 566m    Since TIPS pt having nausea and increased abdominal swelling, feeling in need of repeat tap.  Dr GeCarlean Purlalled in Rx for Zofran and advised pt to contact Dr HeJacqulynn Cadetas she may need ultrasound of TIPS. Increasingly confused in last few days.   Brought to [facility] with fatigue, weakness.  Transferred to [facility] last night.           Ammonia 32.  LFTs elevated but not out of their historic norm with exception of Alk phos.   Na 123 >> 127, was 129 to 133 in recent weeks.  Platelets 53, were 80s to 90s last week.  Coags normal.   AKI with GFR 46 was 57 last week.   Last ascites fluid studies on 11/1: no SBP.  No repeat fluid studies on 11/5 or today.    Last BM was 2 days ago, normally 1 BM daily.  Increased  fatigue, weakness, DOE.  No chest or abdominal pain.  Zofran not that helpful.  No fevers or chills.    Ultrasound Doppler studies of the liver and repeat paracentesis performed this afternoon. Waiting on reports but pt says they removed a little bit more than 4 litres of ascites.  Around the time of the procedure she lost her IV access and has not been able to receive the IV albumin which has been ordered.  ENDOSCOPIC STUDIES: 10/08/13 EGD for melena:  Non-obstructing schatski's ring, small spot of gastric erythema, not bleeding.  10/12/13 Enteroscopy for melena: 2 columns grade 1 esophageal varices, not bleeding.  Non-obstructing distal esophageal stricture, not dilated.  Small HH.  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     10/19/2013 colonoscopy: 2 polyps in sigmoid: 98m30mnd 37m79mubular adenomas without HGD.   11/10/2013 [place]apsule endoscopy [place] undergone esophageal banding and latest EGD there was in 2017, no banding at that time. This hx obtained from pt and her brother in law who is an RN aTherapist, sports long term employee  of [employer].      Past Medical History:  Diagnosis Date  . Allergy   . Anemia   . ARF (acute renal failure) ([place]) 2015   ?prerenal azotemia- resolved by discharge  . Blood transfusion without reported diagnosis   . Bronchitis, acute   . Cholelithiasis   . Cirrhosis ([place]) 08/2013   likely from NASH  . Diabetes mellitus without complication ([place])   . Dyspnea    when fluid builds up in abdomen  . Dysrhythmia   . Esophageal varices ([place])   . Gastritis   . GI bleed 08/2013, 10/2013   EGD negative 08/2013.   Marland Kitchen Headache(784.0)   . Heart murmur   . Hypertension   . Obesity   . Pertussis 2011  . Tubular adenoma of colon   . Uncontrolled type 2 diabetes mellitus , without long-term current use of insulin ([place]) 11/15/2016    Past Surgical History:  Procedure Laterality Date  . COLONOSCOPY W/ BIOPSIES    . IR PARACENTESIS  01/02/2017  . IR PARACENTESIS   01/06/2017  . IR RADIOLOGIST EVAL & MGMT  12/11/2016  . IR TIPS  01/06/2017  . VAGINAL HYSTERECTOMY     partial, for prolapsed uterus.    Prior to Admission medications   Medication Sig Start Date End Date Taking? Authorizing Provider  acetaminophen (TYLENOL) 325 MG tablet Take 325 mg by mouth every 6 (six) hours as needed for mild pain or headache.   Yes [provider]  budesonide (PULMICORT) 0.5 MG/2ML nebulizer solution Take 0.5 mg by nebulization daily as needed (for shortness of breath).   Yes [provider]  CALCIUM LACTATE PO Take 6 tablets by mouth every evening.   Yes [provider]  diltiazem (CARDIZEM CD) 120 MG 24 hr capsule Take 1 capsule daily by mouth. 11/05/16  Yes [provider]  furosemide (LASIX) 40 MG tablet Take 40 mg by mouth daily.  11/11/16  Yes [provider]  glimepiride (AMARYL) 4 MG tablet Take 4 mg by mouth daily with breakfast.    Yes [provider]  lactulose (CHRONULAC) 10 GM/15ML solution Take 30-40 mLs daily by mouth. 01/08/17  Yes [provider]  Lancets (ONETOUCH ULTRASOFT) lancets 1 each by Other route 2 (two) times daily.  04/23/14  Yes [provider]  ondansetron (ZOFRAN ODT) 4 MG disintegrating tablet Take 1 tablet (4 mg total) every 6 (six) hours as needed by mouth for nausea or vomiting. 01/13/17  Yes Gatha Mayer, MD  PRESCRIPTION MEDICATION every 7 (seven) days. Allergy Shots    Yes [provider]  saxagliptin HCl (ONGLYZA) 5 MG TABS tablet Take 5 mg by mouth daily.   Yes [provider]  spironolactone (ALDACTONE) 50 MG tablet Take 50 mg by mouth daily.  11/11/16  Yes [provider]  VENTOLIN HFA 108 (90 Base) MCG/ACT inhaler Inhale 2 puffs into the lungs every 6 (six) hours as needed for wheezing or shortness of breath.  10/14/16  Yes [provider]  diltiazem (DILACOR XR) 120 MG 24 hr capsule Take 120 mg by mouth daily.    [provider]    Scheduled Meds: . diltiazem  120 mg Oral Daily  . furosemide  40 mg Oral Daily  . insulin aspart  0-9 Units Subcutaneous TID WC  . lactulose  20 g Oral Daily  . lidocaine (PF)      . sodium chloride flush  3 mL Intravenous Q12H  .  spironolactone  50 mg Oral Daily   Infusions: . sodium chloride    . albumin human Stopped (01/15/17 1000)   PRN Meds: sodium chloride, albuterol, budesonide, ondansetron **OR** ondansetron (ZOFRAN) IV, sodium chloride flush   Allergies as of 01/14/2017 - Review Complete 01/14/2017  Allergen Reaction Noted  . Biaxin [clarithromycin]  08/14/2013  . Sulfa antibiotics Diarrhea 08/14/2013    Family History  Problem Relation Age of Onset  . Breast cancer Daughter   . Stomach cancer Neg Hx   . Colon cancer Neg Hx     Social History   Socioeconomic History  . Marital status: Single    Spouse name: Not on file  . Number of children: 2  . Years of education: Not on file  . Highest education level: Not on file  Social Needs  . Financial resource strain: Not on file  . Food insecurity - worry: Not on file  . Food insecurity - inability: Not on file  . Transportation needs - medical: Not on file  . Transportation needs - non-medical: Not on file  Occupational History  . Occupation: retired    Fish farm manager: Engineer, [employer] & [employer]  Tobacco Use  . Smoking status: Former Smoker    Years: 1.00    Types: Cigarettes    Last attempt to quit: 10/08/1961    Years since quitting: 55.3  . Smokeless tobacco: Never Used  . Tobacco comment: as a teenager-   Substance and Sexual Activity  . Alcohol use: No  . Drug use: No  . Sexual activity: Not Currently    Partners: Male  Other Topics Concern  . Not on file  Social History Narrative   Retired Human resources officer work   Divorced   1 son and 1 daughter    REVIEW OF SYSTEMS: Constitutional: Generalized weakness and fatigue. ENT:  No nose bleeds Pulm: As abdominal girth has increased, she  has become more short of breath. CV:  No palpitations, no LE edema.  No chest pain GU:  No hematuria, no frequency GI:  Per HPI Heme: No unusual or excessive bleeding/bruising. Transfusions: None Neuro:  No headaches, no peripheral tingling or numbness Derm:  No itching, no rash or sores.  Endocrine:  No sweats or chills.  No polyuria or dysuria Immunization: Records reviewed, she received hepatitis A/B vaccinations in 2015 Travel:  None beyond local counties in last few months.    PHYSICAL EXAM: Vital signs in last 24 hours: Vitals:   01/14/17 1919 01/15/17 0444  BP: (!) 126/43 (!) 132/42  Pulse: 71 78  Resp:  16  Temp: 97.7 F (36.5 C) 97.6 F (36.4 C)  SpO2: 99% 94%   Wt Readings from Last 3 Encounters:  01/14/17 61.2 kg (135 lb)  01/06/17 61 kg (134 lb 8 oz)  01/02/17 61 kg (134 lb 8 oz)    General: Pale and frail, chronically unwell appearing WF who is comfortable Head: No facial asymmetry or swelling. Eyes: No scleral icterus.  No conjunctival pallor.  EOMI. Ears: Not hard of hearing Nose: No discharge or congestion Mouth: Oropharynx moist, pink, clear.  Some missing teeth, mostly molars.  Tongue midline. Neck: No JVD, no masses, no thyromegaly. Lungs: Clear bilaterally.  No cough or labored breathing.  Breath sounds somewhat distant. Heart: RRR.  Audible S1 and S2.  Soft systolic murmur radiates to the upper abdomen. Abdomen: Soft.  Not protuberant or distended.  Umbilical hernia.  Not tender.  Paracentesis site in right lower quadrant covered with  clean, dry bandage..   Rectal: Deferred Musc/Skeltl: No joint swelling, redness or gross deformity. Extremities: No CCE. Neurologic: Alert.  Oriented x3.  No tremor or asterixis.  Psychomotor retardation with slow speech and trouble remembering words. Skin: No jaundice.  No telangiectasia. Tattoos: None Nodes: No cervical or inguinal adenopathy. Psych: Affect a bit flat, pleasant.  Calm and not  agitated.  Intake/Output from previous day: 11/13 0701 - 11/14 0700 In: 123 [P.O.:120; I.V.:3] Out: -  Intake/Output this shift: No intake/output data recorded.  LAB RESULTS: Recent Labs    01/14/17 1344 01/15/17 0542  WBC 11.2* 6.7  HGB 13.0 10.4*  HCT 36.3 29.5*  PLT 86* 53*   BMET Lab Results  Component Value Date   NA 127 (L) 01/15/2017   NA 123 (L) 01/14/2017   NA 131 (L) 01/07/2017   K 3.9 01/15/2017   K 4.5 01/14/2017   K 3.6 01/07/2017   CL 99 (L) 01/15/2017   CL 93 (L) 01/14/2017   CL 105 01/07/2017   CO2 21 (L) 01/15/2017   CO2 20 (L) 01/14/2017   CO2 18 (L) 01/07/2017   GLUCOSE 166 (H) 01/15/2017   GLUCOSE 110 (H) 01/14/2017   GLUCOSE 57 (L) 01/07/2017   BUN 17 01/15/2017   BUN 24 (H) 01/14/2017   BUN 13 01/07/2017   CREATININE 1.12 (H) 01/15/2017   CREATININE 1.16 (H) 01/14/2017   CREATININE 0.96 01/07/2017   CALCIUM 8.2 (L) 01/15/2017   CALCIUM 8.7 (L) 01/14/2017   CALCIUM 7.9 (L) 01/07/2017   LFT Recent Labs    01/14/17 1344 01/15/17 0542  PROT 5.9* 4.4*  ALBUMIN 3.0* 2.2*  AST 118* 86*  ALT 213* 142*  ALKPHOS 349* 280*  BILITOT 7.2* 5.7*   PT/INR Lab Results  Component Value Date   INR 1.18 01/14/2017   INR 1.32 01/06/2017   INR 1.0 12/11/2016   Hepatitis Panel No results for input(s): HEPBSAG, HCVAB, HEPAIGM, HEPBIGM in the last 72 hours. C-Diff No components found for: CDIFF Lipase     Component Value Date/Time   LIPASE 49 10/08/2013 1105    Drugs of Abuse  No results found for: LABOPIA, COCAINSCRNUR, LABBENZ, AMPHETMU, THCU, LABBARB   RADIOLOGY STUDIES: Dg Chest 2 View  Result Date: 01/14/2017 CLINICAL DATA:  Nausea and vomiting, history of recent TIPS EXAM: CHEST  2 VIEW COMPARISON:  08/14/2013, 12/17/2016 FINDINGS: Cardiac shadow is mildly prominent. Left pleural effusion with underlying atelectasis/infiltrate is noted. This is new from the most recent exam of 12/17/2016. Mild right basilar atelectasis is noted as  well. A small right pleural effusion is also seen. Changes consistent with recent TIPS are noted. IMPRESSION: Bilateral pleural effusions with atelectatic changes left significantly greater than right. Electronically Signed   By: Inez Catalina M.D.   On: 01/14/2017 14:37       IMPRESSION:   *  Non alcoholic cirrhosis.   Child's score now 13/class C, up from 9/class B in 11/2016, 2 months ago.    *  Ascites.  S/p TIPS for mgt 11/5 but recurrent ascites  *  Mild AKI, overall improved c/w September and October.    *  Mild encephalopathy, ammonia normal.  Dr Laurence Ferrari added lactulose last week, but pt used this just 1x.   *  Thrombocytopenia.  Normal coags.    *  Anemia on oral iron at home.  Hgb today is down 2.5 gram in 24 hours and down 4 grams in 1 month but stable c/w last week.  No overt melena or GI bleeding.    *  Hx esophageal varices, reported variceal banding in [place], last EGD 2018 with no banding.    *  Hx adenomatous colon polyps 2015    PLAN:     *  Await results of doppler ultrasound done within the last 90 minutes.   *  Infuse albumin as soon as IV access restablished.     *  CMET, CBC in AM.    *  Continue daily Lactulose and Lasix, Aldactone.     Azucena Freed  01/15/2017, 1:38 PM Pager: [phone]  GI ATTENDING  History, laboratories, x-rays reviewed. Agree with comprehensive consultation note as outlined above. 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 Has been having problems with refractory ascites in the face of diuretic intolerance (renal). Underwent TIPS last week. Had large-volume paracentesis (7 L) at that time. Since that time has had recurrence of ascites with complaints of weakness and nausea. Initial evaluation reveals elevated liver tests slightly over baseline. Creatinine and INR are okay. She is on low-dose diuretics. Doppler interrogation shows that TIPS is patent. An additional 4.4 L of fluid  removed today. Fluid was not sent for cellular analysis. The patient has no evidence of encephalopathy or bleeding. She is DO NOT RESUSCITATE.Marland Kitchen Difficult situation. Recommend reinstituting diuretics and monitoring renal function. Possibly may have better control of ascites at lower doses of diuretics post TIPS. Need to confirm that she is watching her by mouth sodium intake. With reaccumulation of ascites might consider ruling out SBP (though she has not had previously). Poor prognosis. GI will follow. Thank you  Docia Chuck. Geri Seminole., M.D. [facility] Division of Gastroenterology

## 2017-01-15 NOTE — Evaluation (Signed)
Physical Therapy Evaluation  Patient Details Name: Cynthia Long MRN: [id number] DOB: 01-07-1941 Today's Date: 01/15/2017   History of Present Illness  Pt is a 76 y/o female admitted secondary to nausea and increased abdominal swelling. Pt recently underwent IR TIPS procedure and IR paracentesis on 11/5. Imaging revealed bilat pleural effusions and increased fluid in abdomen. Pt had R lower abdominal quadrant US paracentesis on 11/14. PMH includes GI bleed, HTN, DM, cirrhosis from NASH, and IR paracentesis and IR TIPS procedure.   Clinical Impression  Pt admitted secondary to problem above with deficits below. PTA, pt was requiring assist from daughter for mobility and for ADLs and IADLs. Upon eval, pt presenting with fatigue, decreased endurance, weakness, and decreased balance. Required min A for mobility this session secondary to unsteadiness. Only able to tolerate short distance in room secondary to fatigue. Discussed d/c plan with pt and family given current deficits. Family may not be able to provide 24/7 assist and are unsure if they can continue to provide as much assist as they currently do. Recommending short term SNF at d/c to increase independence and safety with functional mobility. Will continue to follow acutely to maximize functional mobility independence and safety.     Follow Up Recommendations SNF;Supervision/Assistance - 24 hour    Equipment Recommendations  Other (comment)(TBD pending pt progress )    Recommendations for Other Services       Precautions / Restrictions Precautions Precautions: Fall Restrictions Weight Bearing Restrictions: No      Mobility  Bed Mobility Overal bed mobility: Needs Assistance Bed Mobility: Supine to Sit;Sit to Supine     Supine to sit: Min assist Sit to supine: Min guard   General bed mobility comments: Min A for trunk elevation and for assist with scooting hips to EOB.   Transfers Overall transfer level: Needs  assistance Equipment used: 1 person hand held assist Transfers: Sit to/from Stand Sit to Stand: Min assist         General transfer comment: Min A and HHA for steadying assist. Guarded secondary to abdominal discomfort.   Ambulation/Gait Ambulation/Gait assistance: Min assist Ambulation Distance (Feet): 20 Feet Assistive device: 1 person hand held assist Gait Pattern/deviations: Step-through pattern;Decreased stride length Gait velocity: Decreased  Gait velocity interpretation: Below normal speed for age/gender General Gait Details: Slow, unsteady gait. Required min A for steadying and HHA throughout. Pt easily fatigued and only able to tolerate ambulation within the room.   Stairs            Wheelchair Mobility    Modified Rankin (Stroke Patients Only)       Balance Overall balance assessment: Needs assistance Sitting-balance support: No upper extremity supported;Feet supported Sitting balance-Leahy Scale: Fair     Standing balance support: Single extremity supported;During functional activity Standing balance-Leahy Scale: Poor Standing balance comment: Reliant on UE and external support for balance.                              Pertinent Vitals/Pain Pain Assessment: 0-10 Pain Score: 4  Pain Location: paracentesis site  Pain Descriptors / Indicators: Discomfort Pain Intervention(s): Limited activity within patient's tolerance;Monitored during session;Repositioned    Home Living Family/patient expects to be discharged to:: Private residence Living Arrangements: Children Available Help at Discharge: Family;Available PRN/intermittently Type of Home: House Home Access: Stairs to enter Entrance Stairs-Rails: Right Entrance Stairs-Number of Steps: 10 Home Layout: Two level;Laundry or work area in [place]: Multimedia programmer  seat - built in Additional Comments: Daughter available during the day     Prior Function Level of Independence: Needs  assistance   Gait / Transfers Assistance Needed: Per family pt required assist with walking throughout home  ADL's / Homemaking Assistance Needed: Required assist from daughter for bathing and dressing.   Comments: Was very independent until about September. Working 5 days/week up until september.      Hand Dominance   Dominant Hand: Right    Extremity/Trunk Assessment   Upper Extremity Assessment Upper Extremity Assessment: Defer to OT evaluation    Lower Extremity Assessment Lower Extremity Assessment: Generalized weakness    Cervical / Trunk Assessment Cervical / Trunk Assessment: Kyphotic  Communication   Communication: No difficulties  Cognition Arousal/Alertness: Awake/alert Behavior During Therapy: WFL for tasks assessed/performed Overall Cognitive Status: Within Functional Limits for tasks assessed                                        General Comments General comments (skin integrity, edema, etc.): Per family, pt requires assist with mobility at home from her daughter and has required increased assist from her daughter recently. Pt's daughter also reports burden of care has increased to where it is hard for her to assist pt and work. Educated about SNF given current deficits and family to discuss amongst one another.     Exercises     Assessment/Plan    PT Assessment Patient needs continued PT services  PT Problem List Decreased strength;Decreased balance;Decreased activity tolerance;Decreased mobility;Decreased knowledge of use of DME;Decreased knowledge of precautions;Pain       PT Treatment Interventions DME instruction;Gait training;Stair training;Functional mobility training;Therapeutic activities;Therapeutic exercise;Balance training;Neuromuscular re-education;Patient/family education    PT Goals (Current goals can be found in the Care Plan section)  Acute Rehab PT Goals Patient Stated Goal: to go home  PT Goal Formulation: With  patient Time For Goal Achievement: 01/22/17 Potential to Achieve Goals: Good    Frequency Min 3X/week(in case pt decides to go home )   Barriers to discharge Decreased caregiver support Daughter only available during the day.     Co-evaluation               AM-PAC PT "6 Clicks" Daily Activity  Outcome Measure Difficulty turning over in bed (including adjusting bedclothes, sheets and blankets)?: A Little Difficulty moving from lying on back to sitting on the side of the bed? : Unable Difficulty sitting down on and standing up from a chair with arms (e.g., wheelchair, bedside commode, etc,.)?: Unable Help needed moving to and from a bed to chair (including a wheelchair)?: A Little Help needed walking in hospital room?: A Little Help needed climbing 3-5 steps with a railing? : A Lot 6 Click Score: 13    End of Session   Activity Tolerance: Patient limited by fatigue Patient left: in bed;with call bell/phone within reach;with family/visitor present Nurse Communication: Mobility status PT Visit Diagnosis: Unsteadiness on feet (R26.81);Muscle weakness (generalized) (M62.81);Pain Pain - part of body: (abdomen )    Time: 9675-9163 PT Time Calculation (min) (ACUTE ONLY): 23 min   Charges:   PT Evaluation $PT Eval Low Complexity: 1 Low PT Treatments $Gait Training: 8-22 mins   PT G Codes:        Leighton Ruff, PT, DPT  Acute Rehabilitation Services  Pager: [phone]   Rudean Hitt 01/15/2017, 6:15 PM

## 2017-01-15 NOTE — Progress Notes (Signed)
PROGRESS NOTE    Cynthia Long  MVE:[id number] DOB: 1940/09/13 DOA: 01/14/2017 PCP: Macon Large, MD   Brief Narrative: Cynthia Long is a 76 y.o. female with a history of nonalcoholic steatohepatitis, diabetes mellitus, gastritis, esophageal varices.  Patient presented with symptoms of anorexia and fatigue.  Patient recently had a TIPS procedure on Monday, 01/06/2017 she was found to have acute hepatic failure..  GI was consulted and recommended transfer to [facility] for continued management.  Paracentesis ordered for ascites.   Assessment & Plan:   Active Problems:   HTN (hypertension)   Hepatic cirrhosis (HCC)   Ascites   Uncontrolled type 2 diabetes mellitus , without long-term current use of insulin ([place])   Acute hepatic failure Nonalcoholic steatohepatitis Patient with a recent TIPS procedure. Followed outpatient by gastroenterologists in [place] but is wanting to transition care to [facility] (has seen them in the past). Liver enzymes/alk phos trending down. Bilirubin trending down. -GI recommendations -continue lactulose -continue lasix/spironolactone  Ascites Secondary to hepatic failure. -IR consulted for paracentesis  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 No evidence of bleeding. -trend CBC  Acute kidney injury Secondary to fluid overload in setting of acute hepatic failure. Improved overnight.  Hyponatremia Hypervolemic hyponatremia secondary to cirrhosis and acute hepatic failure. Improved.  Essential hypertension -continue diltiazem  Diabetes mellitus, type 2 Patient on glimepiride and Onglyza as an outpatient. Labile blood sugar secondary to hepatic failure -discontinue glimepiride -continue SSI    DVT prophylaxis: SCDs Code Status: DNR Family Communication: Son and son in law Disposition Plan: Discharge pending medical improvement and PT/OT recommendatinos   Consultants:   Gastroenterology  Interventional  radiology  Procedures:   None  Antimicrobials:  None    Subjective: Patient reports mild abdominal discomfort.   Objective: Vitals:   01/14/17 1730 01/14/17 1800 01/14/17 1919 01/15/17 0444  BP: (!) 117/47 (!) 136/43 (!) 126/43 (!) 132/42  Pulse: 71 69 71 78  Resp: 16 14  16   Temp:   97.7 F (36.5 C) 97.6 F (36.4 C)  TempSrc:   Oral Oral  SpO2: 95% 98% 99% 94%  Weight:      Height:        Intake/Output Summary (Last 24 hours) at 01/15/2017 1239 Last data filed at 01/15/2017 1112 Gross per 24 hour  Intake 123 ml  Output -  Net 123 ml   Filed Weights   01/14/17 1113  Weight: 61.2 kg (135 lb)    Examination:  General exam: Appears calm and comfortable Respiratory system: Clear to auscultation. Respiratory effort normal. Cardiovascular system: S1 & S2 heard, RRR. 2/6 systolic murmur Gastrointestinal system: Abdomen is distended with fluid shift. Normal bowel sounds heard. Central nervous system: Alert and oriented. No focal neurological deficits. Extremities: No edema. No calf tenderness Skin: No cyanosis. No rashes Psychiatry: Judgement and insight appear normal. Mood & affect appropriate.     Data Reviewed: I have personally reviewed following labs and imaging studies  CBC: Recent Labs  Lab 01/14/17 1344 01/15/17 0542  WBC 11.2* 6.7  NEUTROABS 9.2*  --   HGB 13.0 10.4*  HCT 36.3 29.5*  MCV 87.9 87.5  PLT 86* 53*   Basic Metabolic Panel: Recent Labs  Lab 01/14/17 1344 01/15/17 0542  NA 123* 127*  K 4.5 3.9  CL 93* 99*  CO2 20* 21*  GLUCOSE 110* 166*  BUN 24* 17  CREATININE 1.16* 1.12*  CALCIUM 8.7* 8.2*   GFR: Estimated Creatinine Clearance: 38.5 mL/min (A) (by C-G formula  based on SCr of 1.12 mg/dL (H)). Liver Function Tests: Recent Labs  Lab 01/14/17 1344 01/15/17 0542  AST 118* 86*  ALT 213* 142*  ALKPHOS 349* 280*  BILITOT 7.2* 5.7*  PROT 5.9* 4.4*  ALBUMIN 3.0* 2.2*   No results for input(s): LIPASE, AMYLASE in the  last 168 hours. Recent Labs  Lab 01/14/17 1432  AMMONIA 32   Coagulation Profile: Recent Labs  Lab 01/14/17 1344  INR 1.18   Cardiac Enzymes: No results for input(s): CKTOTAL, CKMB, CKMBINDEX, TROPONINI in the last 168 hours. BNP (last 3 results) No results for input(s): PROBNP in the last 8760 hours. HbA1C: Recent Labs    01/15/17 0544  HGBA1C 5.4   CBG: Recent Labs  Lab 01/15/17 0026 01/15/17 0052 01/15/17 0438 01/15/17 0503 01/15/17 0642  GLUCAP 53* 82 48* 197* 111*   Lipid Profile: No results for input(s): CHOL, HDL, LDLCALC, TRIG, CHOLHDL, LDLDIRECT in the last 72 hours. Thyroid Function Tests: No results for input(s): TSH, T4TOTAL, FREET4, T3FREE, THYROIDAB in the last 72 hours. Anemia Panel: No results for input(s): VITAMINB12, FOLATE, FERRITIN, TIBC, IRON, RETICCTPCT in the last 72 hours. Sepsis Labs: No results for input(s): PROCALCITON, LATICACIDVEN in the last 168 hours.  No results found for this or any previous visit (from the past 240 hour(s)).       Radiology Studies: Dg Chest 2 View  Result Date: 01/14/2017 CLINICAL DATA:  Nausea and vomiting, history of recent TIPS EXAM: CHEST  2 VIEW COMPARISON:  08/14/2013, 12/17/2016 FINDINGS: Cardiac shadow is mildly prominent. Left pleural effusion with underlying atelectasis/infiltrate is noted. This is new from the most recent exam of 12/17/2016. Mild right basilar atelectasis is noted as well. A small right pleural effusion is also seen. Changes consistent with recent TIPS are noted. IMPRESSION: Bilateral pleural effusions with atelectatic changes left significantly greater than right. Electronically Signed   By: Inez Catalina M.D.   On: 01/14/2017 14:37        Scheduled Meds: . diltiazem  120 mg Oral Daily  . furosemide  40 mg Oral Daily  . insulin aspart  0-9 Units Subcutaneous TID WC  . lactulose  20 g Oral Daily  . lidocaine (PF)      . sodium chloride flush  3 mL Intravenous Q12H  .  spironolactone  50 mg Oral Daily   Continuous Infusions: . sodium chloride    . albumin human Stopped (01/15/17 1000)     LOS: 1 day     Cordelia Poche, MD [facility] 01/15/2017, 12:39 PM Pager: [phone]  If 7PM-7AM, please contact night-coverage [email or web address] Password [email or web address] 01/15/2017, 12:39 PM

## 2017-01-15 NOTE — Progress Notes (Signed)
Recheck CBG since patient felt jittery.  CBG=48. Gave 50 cc of 50% dextrose Injection per hypoglycemic protocol and will recheck CBG again at 0500.

## 2017-01-15 NOTE — Progress Notes (Addendum)
CBG=197 at 0503 after administration of 50 mL 50% dextrose Injection.  On-call [employer] coverage was made aware. Patient felt "ok" and stated, "Now I can go back to sleep".  Will closely monitor.

## 2017-01-15 NOTE — Progress Notes (Signed)
1345 Received pt from [place], post Paracenters. Bandaid to LLQ dry and intact. Denies pain at this time.

## 2017-01-16 DIAGNOSIS — D649 Anemia, unspecified: Secondary | ICD-10-CM

## 2017-01-16 DIAGNOSIS — K766 Portal hypertension: Secondary | ICD-10-CM

## 2017-01-16 LAB — GLUCOSE, CAPILLARY
GLUCOSE-CAPILLARY: 99 mg/dL (ref 65–99)
Glucose-Capillary: 133 mg/dL — ABNORMAL HIGH (ref 65–99)
Glucose-Capillary: 76 mg/dL (ref 65–99)

## 2017-01-16 LAB — COMPREHENSIVE METABOLIC PANEL
ALBUMIN: 2.5 g/dL — AB (ref 3.5–5.0)
ALT: 117 U/L — AB (ref 14–54)
ANION GAP: 6 (ref 5–15)
AST: 107 U/L — AB (ref 15–41)
Alkaline Phosphatase: 278 U/L — ABNORMAL HIGH (ref 38–126)
BILIRUBIN TOTAL: 6.3 mg/dL — AB (ref 0.3–1.2)
BUN: 13 mg/dL (ref 6–20)
CALCIUM: 8 mg/dL — AB (ref 8.9–10.3)
CHLORIDE: 101 mmol/L (ref 101–111)
CO2: 25 mmol/L (ref 22–32)
Creatinine, Ser: 1.22 mg/dL — ABNORMAL HIGH (ref 0.44–1.00)
GFR calc non Af Amer: 43 mL/min — ABNORMAL LOW (ref >60)
GFR, EST AFRICAN AMERICAN: 50 mL/min — AB (ref >60)
GLUCOSE: 58 mg/dL — AB (ref 65–99)
POTASSIUM: 4 mmol/L (ref 3.5–5.1)
Sodium: 132 mmol/L — ABNORMAL LOW (ref 135–145)
Total Protein: 4.5 g/dL — ABNORMAL LOW (ref 6.5–8.1)

## 2017-01-16 LAB — CBC
HEMATOCRIT: 30.9 % — AB (ref 36.0–46.0)
Hemoglobin: 10.4 g/dL — ABNORMAL LOW (ref 12.0–15.0)
MCH: 30.4 pg (ref 26.0–34.0)
MCHC: 33.7 g/dL (ref 30.0–36.0)
MCV: 90.4 fL (ref 78.0–100.0)
PLATELETS: 57 10*3/uL — AB (ref 150–400)
RBC: 3.42 MIL/uL — AB (ref 3.87–5.11)
RDW: 20 % — AB (ref 11.5–15.5)
WBC: 9.5 10*3/uL (ref 4.0–10.5)

## 2017-01-16 MED ORDER — SPIRONOLACTONE 50 MG PO TABS
50.0000 mg | ORAL_TABLET | Freq: Every day | ORAL | Status: DC
  Administered 2017-01-17 – 2017-01-18 (×2): 50 mg via ORAL
  Filled 2017-01-16 (×2): qty 1

## 2017-01-16 MED ORDER — WHITE PETROLATUM EX OINT
TOPICAL_OINTMENT | CUTANEOUS | Status: AC
  Filled 2017-01-16: qty 28.35

## 2017-01-16 MED ORDER — LACTULOSE 10 GM/15ML PO SOLN
20.0000 g | Freq: Once | ORAL | Status: AC
  Administered 2017-01-16: 20 g via ORAL
  Filled 2017-01-16: qty 30

## 2017-01-16 MED ORDER — FUROSEMIDE 20 MG PO TABS
20.0000 mg | ORAL_TABLET | Freq: Every day | ORAL | Status: DC
  Administered 2017-01-17 – 2017-01-18 (×2): 20 mg via ORAL
  Filled 2017-01-16 (×2): qty 1

## 2017-01-16 NOTE — Care Management Note (Addendum)
Case Management Note  Patient Details  Name: Cynthia Long MRN: [id number] Date of Birth: [birth year]  Subjective/Objective:                    Action/Plan:  See social worker note . Discussed discharged planning with patient at bedside confirmed face sheet information with patient.   Patient wants to go home at discharge with home health. States she does not need any DME . Asked patient if she has assistance at home. Patient states she lives by herself , but her daughter has a home office at her place( patient's home) and is there all day and can assist her when needed. Read PT note to patient that states daughter was present during PT session and is not sure she can provide the care that her mother needs at discharge. Patient states daughter can assist her at discharge , because " I don't need anything but meals". Asked who would be staying with her at night and patient stated she does not need anyone to stay with her at night. Patient consented for NCM to call daughter Lewie Loron cell [phone] to discuss discharge plan. Called Ms Lind Guest and left my direct call back number on her voice mail. Awaiting call back.   Patient would like home health through [facility] , who have changed there name to [facility] phone [phone] fax [phone]. Expected Discharge Date:                  Expected Discharge Plan:     In-House Referral:     Discharge planning Services  CM Consult  Post Acute Care Choice:    Choice offered to:  Patient  DME Arranged:    DME Agency:     HH Arranged:    [place] Agency:     Status of Service:  In process, will continue to follow  If discussed at Long Length of Stay Meetings, dates discussed:    Additional Comments:  Marilu Favre, RN 01/16/2017, 2:34 PM

## 2017-01-16 NOTE — Progress Notes (Signed)
CSW met with patient to discuss recommendation for SNF. Patient indicated that she really would prefer going home, and would be willing to work on finding support for when she's discharged. Patient's daughter works from home out of a home office at her home, so is there throughout the day every day to help her. Patient's daughter could potentially be able to stay with her at night for a little while at discharge to help, as well; patient would have to ask her. Patient discussed that she could also check to see if her son would be willing to come and stay with her for a little while at night to have 24 hour support.  Patient will be willing to have home health set up. Patient will need resources for [place], [place], because she has not had home health set up before so she isn't sure what services are available. CSW to alert RNCM.  CSW signing off. Please consult if patient becomes agreeable to SNF.  Laveda Abbe, [place] Clinical Social Worker [phone]

## 2017-01-16 NOTE — Progress Notes (Addendum)
Daily Rounding Note  01/16/2017, 9:53 AM  LOS: 2 days   SUBJECTIVE:   Chief complaint: Cirrhosis with portal hypertensive ascites She has upper abdominal cramping, which is typical for her after paracentesis and is/was a contributing factor in decision to place TIPS.  Urinating ok.  No n/v but has not eaten much.  No BM yesterday or today, got lactulose dose this AM.  Walked in hallway.  No SOB.      OBJECTIVE:         Vital signs in last 24 hours:    Temp:  [97.7 F (36.5 C)-98.7 F (37.1 C)] 98.2 F (36.8 C) (11/15 0444) Pulse Rate:  [54-74] 54 (11/15 0444) Resp:  [16-17] 16 (11/15 0444) BP: (106-129)/(31-54) 112/39 (11/15 0444) SpO2:  [95 %-100 %] 96 % (11/15 0444) Last BM Date: 01/12/17 Filed Weights   01/14/17 1113  Weight: 61.2 kg (135 lb)   General: pleasant, looks frail and pale but overall not acutely ill looking.  She is alert and conversational with fluent speech Heart: RRR with a harsh systolic murmur (known moderate mitral stenosis on recent cardiac echo) Chest: clear bil, reduced BS at right base. Abdomen: soft, NT, ND.  Slight protuberance and umbilical Hernia.  Mild upper abdominal tenderness without guarding or rebound, spleen tip palpable. Left lobe liver enlarged Extremities: no CCE Neuro/Psych:  Alert, Oriented x 3.  Good historian.  No asterixis or treemor, moves all 4 limbs.    Intake/Output from previous day: 11/14 0701 - 11/15 0700 In: 460 [P.O.:460] Out: -   Intake/Output this shift: Total I/O In: 240 [P.O.:240] Out: -  Review of systems: She denies, dyspnea with exertion or dysuria. She has for abdominal pain after walking and generalized fatigue.  Lab Results: Recent Labs    01/14/17 1344 01/15/17 0542 01/16/17 0541  WBC 11.2* 6.7 9.5  HGB 13.0 10.4* 10.4*  HCT 36.3 29.5* 30.9*  PLT 86* 53* 57*   BMET Recent Labs    01/14/17 1344 01/15/17 0542 01/16/17 0541  NA 123*  127* 132*  K 4.5 3.9 4.0  CL 93* 99* 101  CO2 20* 21* 25  GLUCOSE 110* 166* 58*  BUN 24* 17 13  CREATININE 1.16* 1.12* 1.22*  CALCIUM 8.7* 8.2* 8.0*   LFT Recent Labs    01/14/17 1344 01/15/17 0542 01/16/17 0541  PROT 5.9* 4.4* 4.5*  ALBUMIN 3.0* 2.2* 2.5*  AST 118* 86* 107*  ALT 213* 142* 117*  ALKPHOS 349* 280* 278*  BILITOT 7.2* 5.7* 6.3*   PT/INR Recent Labs    01/14/17 1344  LABPROT 14.9  INR 1.18   Hepatitis Panel No results for input(s): HEPBSAG, HCVAB, HEPAIGM, HEPBIGM in the last 72 hours. Recent echocardiogram report reviewed  Studies/Results: Dg Chest 2 View  Result Date: 01/14/2017 CLINICAL DATA:  Nausea and vomiting, history of recent TIPS EXAM: CHEST  2 VIEW COMPARISON:  08/14/2013, 12/17/2016 FINDINGS: Cardiac shadow is mildly prominent. Left pleural effusion with underlying atelectasis/infiltrate is noted. This is new from the most recent exam of 12/17/2016. Mild right basilar atelectasis is noted as well. A small right pleural effusion is also seen. Changes consistent with recent TIPS are noted. IMPRESSION: Bilateral pleural effusions with atelectatic changes left significantly greater than right. Electronically Signed   By: Inez Catalina M.D.   On: 01/14/2017 14:37   US Abdomen Complete  Result Date: 01/15/2017 CLINICAL DATA:  Transaminitis, effusion post paracentesis. Recent TIPS 01/06/2017. EXAM: COMPLETE ABDOMINAL  ULTRASOUND US HEPATIC DOPPLER ARTERIAL/VENOUS ULTRASOUND COMPARISON:  MR 12/17/2016 FINDINGS: Gallbladder: Echogenic calculi in the dependent aspect of the gallbladder near the neck, measured up to 0.8 cm diameter. No wall thickening. No sonographic Murphy sign per technologist. Common bile duct:  Normal in caliber, 3.32mm diameter. Liver: Nodular parenchymal contour and coarse echotexture. No focal lesion. Portal Vein Velocities Main:  41 cm/sec, hepatopetal Right: 24 cm/sec, hepatofugal Left:  6 cm/sec, hepatofugal TIPS stent velocities  (all  antegrade): Proximal 119 cm/sec Mid 134 cm/sec Distal 100 cm/sec Hepatic Vein Velocities (all hepatofugal) Right:  24 cm/sec Middle:  26 cm/sec Left:  50  cm/sec Hepatic Artery Velocity: 118/19 cm/sec IVC:  Negative Pancreas: Visualized segments unremarkable, portions obscured by overlying bowel gas. Spleen:  No focal lesion, 13.1 x 5.7 x 12.6 cm (volume = 490 cm^3) Splenic Vein Velocity: 36 cm/sec Superior mesenteric vein velocity 33 cm/second Right Kidney:  No mass or hydronephrosis, 10.4cm in length. Left Kidney:  No lesion or hydronephrosis, 10cm in length. Abdominal aorta: Visualized segments unremarkable, portions obscured by overlying bowel gas. Varices: None identified Ascites: Present Bilateral pleural effusions noted. IMPRESSION: 1. Patent TIPS stent with reversal of flow within the right and left branches of the portal vein. 2. Nodular liver, splenomegaly, and ascites consistent with cirrhosis and portal venous hypertension. 3. Cholelithiasis without other ultrasound evidence of cholecystitis or biliary obstruction. Electronically Signed   By: Lucrezia Europe M.D.   On: 01/15/2017 17:03   US Paracentesis  Result Date: 01/15/2017 INDICATION: History of NASH cirrhosis with recurrent abdominal ascites. The patient is status post TIPS on January 06, 2017. Request has been made for paracentesis. EXAM: ULTRASOUND GUIDED THERAPEUTIC PARACENTESIS MEDICATIONS: 1% xylocaine COMPLICATIONS: None immediate. PROCEDURE: Informed written consent was obtained from the patient after a discussion of the risks, benefits and alternatives to treatment. A timeout was performed prior to the initiation of the procedure. Initial ultrasound scanning demonstrates a moderate amount of ascites within the right lower abdominal quadrant. The right lower abdomen was prepped and draped in the usual sterile fashion. 1% xylocaine was used for local anesthesia. Following this, a 19 gauge, 7-cm, Yueh catheter was introduced. An ultrasound  image was saved for documentation purposes. The paracentesis was performed. The catheter was removed and a dressing was applied. The patient tolerated the procedure well without immediate post procedural complication. FINDINGS: A total of approximately 4.4 L of icteric fluid was removed. IMPRESSION: Successful ultrasound-guided paracentesis yielding 4.4 liters of peritoneal fluid. Read by: Saverio Danker, PA-C Electronically Signed   By: Jerilynn Mages.  Shick M.D.   On: 01/15/2017 15:05   US Liver Doppler  Result Date: 01/15/2017 CLINICAL DATA:  Transaminitis, effusion post paracentesis. Recent TIPS 01/06/2017. EXAM: COMPLETE ABDOMINAL ULTRASOUND US HEPATIC DOPPLER ARTERIAL/VENOUS ULTRASOUND COMPARISON:  MR 12/17/2016 FINDINGS: Gallbladder: Echogenic calculi in the dependent aspect of the gallbladder near the neck, measured up to 0.8 cm diameter. No wall thickening. No sonographic Murphy sign per technologist. Common bile duct:  Normal in caliber, 3.12mm diameter. Liver: Nodular parenchymal contour and coarse echotexture. No focal lesion. Portal Vein Velocities Main:  41 cm/sec, hepatopetal Right: 24 cm/sec, hepatofugal Left:  6 cm/sec, hepatofugal TIPS stent velocities  (all antegrade): Proximal 119 cm/sec Mid 134 cm/sec Distal 100 cm/sec Hepatic Vein Velocities (all hepatofugal) Right:  24 cm/sec Middle:  26 cm/sec Left:  50  cm/sec Hepatic Artery Velocity: 118/19 cm/sec IVC:  Negative Pancreas: Visualized segments unremarkable, portions obscured by overlying bowel gas. Spleen:  No focal lesion, 13.1 x 5.7  x 12.6 cm (volume = 490 cm^3) Splenic Vein Velocity: 36 cm/sec Superior mesenteric vein velocity 33 cm/second Right Kidney:  No mass or hydronephrosis, 10.4cm in length. Left Kidney:  No lesion or hydronephrosis, 10cm in length. Abdominal aorta: Visualized segments unremarkable, portions obscured by overlying bowel gas. Varices: None identified Ascites: Present Bilateral pleural effusions noted. IMPRESSION: 1. Patent  TIPS stent with reversal of flow within the right and left branches of the portal vein. 2. Nodular liver, splenomegaly, and ascites consistent with cirrhosis and portal venous hypertension. 3. Cholelithiasis without other ultrasound evidence of cholecystitis or biliary obstruction. Electronically Signed   By: Lucrezia Europe M.D.   On: 01/15/2017 17:03   Scheduled Meds: . diltiazem  120 mg Oral Daily  . furosemide  40 mg Oral Daily  . insulin aspart  0-9 Units Subcutaneous TID WC  . lactulose  20 g Oral Daily  . sodium chloride flush  3 mL Intravenous Q12H  . spironolactone  50 mg Oral Daily   Continuous Infusions: . 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   LFTs fluctuating,   * portal hypertensive  ascites.  S/p TIPS for mgt 11/5 but recurrent ascites Doppler ultrasound and 4.4 liter paracentesis 11/14.  TIPS patent though retrograde flow through PV.    *  Mild AKI, overall improved c/w September and October.    *  Mild encephalopathy, ammonia normal.  Lactulose added last week, but pt used this just 1x at home.  Now receiving daily but still no BM  *  Hyponatremia.  Improved.   .   *  Thrombocytopenia, stable in 64s.  Normal coags.    *  Anemia on oral iron at home.  Hgb stable c/w last week.  No overt melena or GI bleeding.   *  Type 2 DM, note Diabetes coordinator's (RN) 11/14 questioning need for Amaryl (and possibly Onglyza as well) given drop in Hgb A1c and hospital observed hypoglycemic events.  These are both currently on hold.     *  Hx esophageal varices, reported variceal banding in [place], last EGD 2018 with no banding.    *  Hx adenomatous colon polyps 2015   PLAN   *  Hold diuretics given decline in renal function?  Already  received these today Add additional dose lactulose today.    *  2gm Na diet.  Can be more liberal with carb restrictions given no need for tight glucose control in setting of hypoglycemic and decompensated cirrhosis.  Her diabetes is not a long term risk, her liver will be her undoing.    *  CMET in AM.    Addendum 11:30 PM Spoke with Dr Anselm Pancoast, IR, today re the retrograde flow through PV.  This can lead to hypoperfusion of the liver and subsequent worsening liver failure, in which case the TIPS would need to be occluded (reversed) by IR.  For now he suggests a wait and see approach; follow labs and clinical status for signs of worsening liver failure.  Re: resolution of ascites, it may be 2 weeks post TIPS before it has impact on re-accumulation/resolution of ascites.     Azucena Freed  01/16/2017, 9:53 AM Pager: [phone]   I have discussed the case with the PA, and that  is the plan I formulated. I personally interviewed and examined the patient.  She has a complex picture of cirrhosis with portal hypertensive ascites that has persisted despite recent TIPS placement. She was displaying a type II hepatorenal syndrome type physiology with renal sensitivity to relatively low-dose diuretics. It is bothersome that she still has hepatofugal portal flow after TIPS, but I suspect it may take weeks yet for her portal and hepatic vascular hemodynamic physiology to adjust after TIPS placement. I am therefore hopeful she will have control of, and perhaps resolution of, any significant ascites with time. I am in agreement with Dr. Blanch Media consult note of yesterday that we continue low-dose diuretics and watch her renal function closely. Her creatinine is stable at 1.2 today. INR is normal, and she is not encephalopathic on low dose lactulose.  We have decided to resume spironolactone 50 mg once daily and furosemide 20 g once daily tomorrow morning. She is already received diuretics today.  BMP tomorrow  morning is ordered. If renal function remained stable on this, she may be ready to discharge in the next day or 2 for close follow-up and check of renal function through our office. We will make that decision day by day.  Total 40 minutes face-to-face time, over half spent in extensive record review, counseling and coordinating care this complex patient.  Nelida Meuse III Pager [phone]  Mon-Fri 8a-5p [phone] after 5p, weekends, holidays

## 2017-01-16 NOTE — Progress Notes (Signed)
Inpatient Diabetes Program Recommendations  AACE/ADA: New Consensus Statement on Inpatient Glycemic Control (2015)  Target Ranges:  Prepandial:   less than 140 mg/dL      Peak postprandial:   less than 180 mg/dL (1-2 hours)      Critically ill patients:  140 - 180 mg/dL   Lab Results  Component Value Date   GLUCAP 99 01/16/2017   HGBA1C 5.4 01/15/2017    Review of Glycemic Control  Inpatient Diabetes Program Recommendations:    Received consult. Spoke with patient @ bedside. Reviewed with patient symptoms and treatment of hypoglycemia. Patient able to give verbal feedback on 15:15 rule. Reviewed that medications may be changed due to lower CBGs.  Thank you, Nani Gasser. Dortha Neighbors, RN, MSN, CDE  Diabetes Coordinator Inpatient Glycemic Control Team Team Pager [phone] (8am-5pm) 01/16/2017 2:24 PM

## 2017-01-16 NOTE — Progress Notes (Signed)
PROGRESS NOTE    Cynthia Long  TRZ:[id number] DOB: 1940/05/16 DOA: 01/14/2017 PCP: Macon Large, MD   Brief Narrative: Cynthia Long is a 76 y.o. female with a history of nonalcoholic steatohepatitis, diabetes mellitus, gastritis, esophageal varices.  Patient presented with symptoms of anorexia and fatigue.  Patient recently had a TIPS procedure on Monday, 01/06/2017 she was found to have acute hepatic failure..  GI was consulted and recommended transfer to [facility] for continued management.  Paracentesis ordered for ascites.   Assessment & Plan:   Active Problems:   HTN (hypertension)   Hepatic cirrhosis (HCC)   Ascites   Uncontrolled type 2 diabetes mellitus , without long-term current use of insulin ([place])   Acute hepatic failure Nonalcoholic steatohepatitis Patient with a recent TIPS procedure. Followed outpatient by gastroenterologists in [place] but is wanting to transition care to [facility] (has seen them in the past). AST and bilirubin increased slightly. ALT decreased and alk phos stable. -GI recommendations: hold lasix/spironolactone -continue lactulose -may need to consider palliative care consult depending on GI's recommendation  Ascites Secondary to hepatic failure. S/p paracentesis with 4.4L removed. No abdominal pain or confusion to suspect SBP.  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 No evidence of bleeding. Stable. -trend CBC  Acute kidney injury Secondary to fluid overload in setting of acute hepatic failure. Improved initially and slightly worsened/stable since yesterday. -will hold diuretics in setting of recent paracentesis  Hyponatremia Hypervolemic hyponatremia secondary to cirrhosis and acute hepatic failure. Improved.  Essential hypertension -continue diltiazem  Diabetes mellitus, type 2 Patient on glimepiride and Onglyza as an outpatient. Labile blood sugar secondary to hepatic failure. Hemoglobin A1C of  5.4% -discontinue glimepiride and Onglyza on discharge -continue SSI  Abdominal cramping Secondary to recent paracentesis. Intermittent. -continue Tramadol.   DVT prophylaxis: SCDs Code Status: DNR Family Communication: Son and son in law Disposition Plan: Discharge pending medical improvement and PT/OT recommendatinos   Consultants:   Gastroenterology  Interventional radiology  Procedures:   None  Antimicrobials:  None    Subjective: Some abdominal cramping. Usually Tylenol helped. Tramadol helped slightly  Objective: Vitals:   01/15/17 1349 01/15/17 1509 01/15/17 2117 01/16/17 0444  BP: (!) 122/45 (!) 118/35 (!) 106/31 (!) 112/39  Pulse:  74 68 (!) 54  Resp:  16 17 16   Temp: 98.7 F (37.1 C) 97.7 F (36.5 C) 98.7 F (37.1 C) 98.2 F (36.8 C)  TempSrc: Oral Oral Oral Oral  SpO2: 95% 100% 96% 96%  Weight:      Height:        Intake/Output Summary (Last 24 hours) at 01/16/2017 1133 Last data filed at 01/16/2017 0956 Gross per 24 hour  Intake 922 ml  Output -  Net 922 ml   Filed Weights   01/14/17 1113  Weight: 61.2 kg (135 lb)    Examination:  General exam: Appears calm and comfortable Respiratory system: Clear to auscultation. Respiratory effort normal. Cardiovascular system: S1 & S2 heard, RRR. 2/6 systolic murmur Gastrointestinal system: Soft, nontender, normal bowel sounds Central nervous system: Alert and oriented. No focal neurological deficits. Extremities: No edema. No calf tenderness Skin: No cyanosis. No rashes Psychiatry: Judgement and insight appear normal. Mood & affect appropriate.     Data Reviewed: I have personally reviewed following labs and imaging studies  CBC: Recent Labs  Lab 01/14/17 1344 01/15/17 0542 01/16/17 0541  WBC 11.2* 6.7 9.5  NEUTROABS 9.2*  --   --   HGB 13.0 10.4* 10.4*  HCT 36.3  29.5* 30.9*  MCV 87.9 87.5 90.4  PLT 86* 53* 57*   Basic Metabolic Panel: Recent Labs  Lab 01/14/17 1344  01/15/17 0542 01/16/17 0541  NA 123* 127* 132*  K 4.5 3.9 4.0  CL 93* 99* 101  CO2 20* 21* 25  GLUCOSE 110* 166* 58*  BUN 24* 17 13  CREATININE 1.16* 1.12* 1.22*  CALCIUM 8.7* 8.2* 8.0*   GFR: Estimated Creatinine Clearance: 35.3 mL/min (A) (by C-G formula based on SCr of 1.22 mg/dL (H)). Liver Function Tests: Recent Labs  Lab 01/14/17 1344 01/15/17 0542 01/16/17 0541  AST 118* 86* 107*  ALT 213* 142* 117*  ALKPHOS 349* 280* 278*  BILITOT 7.2* 5.7* 6.3*  PROT 5.9* 4.4* 4.5*  ALBUMIN 3.0* 2.2* 2.5*   No results for input(s): LIPASE, AMYLASE in the last 168 hours. Recent Labs  Lab 01/14/17 1432  AMMONIA 32   Coagulation Profile: Recent Labs  Lab 01/14/17 1344  INR 1.18   Cardiac Enzymes: No results for input(s): CKTOTAL, CKMB, CKMBINDEX, TROPONINI in the last 168 hours. BNP (last 3 results) No results for input(s): PROBNP in the last 8760 hours. HbA1C: Recent Labs    01/15/17 0544  HGBA1C 5.4   CBG: Recent Labs  Lab 01/15/17 0503 01/15/17 0642 01/15/17 1400 01/15/17 1737 01/16/17 0020  GLUCAP 197* 111* 102* 75 76   Lipid Profile: No results for input(s): CHOL, HDL, LDLCALC, TRIG, CHOLHDL, LDLDIRECT in the last 72 hours. Thyroid Function Tests: No results for input(s): TSH, T4TOTAL, FREET4, T3FREE, THYROIDAB in the last 72 hours. Anemia Panel: No results for input(s): VITAMINB12, FOLATE, FERRITIN, TIBC, IRON, RETICCTPCT in the last 72 hours. Sepsis Labs: No results for input(s): PROCALCITON, LATICACIDVEN in the last 168 hours.  No results found for this or any previous visit (from the past 240 hour(s)).       Radiology Studies: Dg Chest 2 View  Result Date: 01/14/2017 CLINICAL DATA:  Nausea and vomiting, history of recent TIPS EXAM: CHEST  2 VIEW COMPARISON:  08/14/2013, 12/17/2016 FINDINGS: Cardiac shadow is mildly prominent. Left pleural effusion with underlying atelectasis/infiltrate is noted. This is new from the most recent exam of  12/17/2016. Mild right basilar atelectasis is noted as well. A small right pleural effusion is also seen. Changes consistent with recent TIPS are noted. IMPRESSION: Bilateral pleural effusions with atelectatic changes left significantly greater than right. Electronically Signed   By: Inez Catalina M.D.   On: 01/14/2017 14:37   US Abdomen Complete  Result Date: 01/15/2017 CLINICAL DATA:  Transaminitis, effusion post paracentesis. Recent TIPS 01/06/2017. EXAM: COMPLETE ABDOMINAL ULTRASOUND US HEPATIC DOPPLER ARTERIAL/VENOUS ULTRASOUND COMPARISON:  MR 12/17/2016 FINDINGS: Gallbladder: Echogenic calculi in the dependent aspect of the gallbladder near the neck, measured up to 0.8 cm diameter. No wall thickening. No sonographic Murphy sign per technologist. Common bile duct:  Normal in caliber, 3.56m diameter. Liver: Nodular parenchymal contour and coarse echotexture. No focal lesion. Portal Vein Velocities Main:  41 cm/sec, hepatopetal Right: 24 cm/sec, hepatofugal Left:  6 cm/sec, hepatofugal TIPS stent velocities  (all antegrade): Proximal 119 cm/sec Mid 134 cm/sec Distal 100 cm/sec Hepatic Vein Velocities (all hepatofugal) Right:  24 cm/sec Middle:  26 cm/sec Left:  50  cm/sec Hepatic Artery Velocity: 118/19 cm/sec IVC:  Negative Pancreas: Visualized segments unremarkable, portions obscured by overlying bowel gas. Spleen:  No focal lesion, 13.1 x 5.7 x 12.6 cm (volume = 490 cm^3) Splenic Vein Velocity: 36 cm/sec Superior mesenteric vein velocity 33 cm/second Right Kidney:  No mass  or hydronephrosis, 10.4cm in length. Left Kidney:  No lesion or hydronephrosis, 10cm in length. Abdominal aorta: Visualized segments unremarkable, portions obscured by overlying bowel gas. Varices: None identified Ascites: Present Bilateral pleural effusions noted. IMPRESSION: 1. Patent TIPS stent with reversal of flow within the right and left branches of the portal vein. 2. Nodular liver, splenomegaly, and ascites consistent with  cirrhosis and portal venous hypertension. 3. Cholelithiasis without other ultrasound evidence of cholecystitis or biliary obstruction. Electronically Signed   By: Lucrezia Europe M.D.   On: 01/15/2017 17:03   US Paracentesis  Result Date: 01/15/2017 INDICATION: History of NASH cirrhosis with recurrent abdominal ascites. The patient is status post TIPS on January 06, 2017. Request has been made for paracentesis. EXAM: ULTRASOUND GUIDED THERAPEUTIC PARACENTESIS MEDICATIONS: 1% xylocaine COMPLICATIONS: None immediate. PROCEDURE: Informed written consent was obtained from the patient after a discussion of the risks, benefits and alternatives to treatment. A timeout was performed prior to the initiation of the procedure. Initial ultrasound scanning demonstrates a moderate amount of ascites within the right lower abdominal quadrant. The right lower abdomen was prepped and draped in the usual sterile fashion. 1% xylocaine was used for local anesthesia. Following this, a 19 gauge, 7-cm, Yueh catheter was introduced. An ultrasound image was saved for documentation purposes. The paracentesis was performed. The catheter was removed and a dressing was applied. The patient tolerated the procedure well without immediate post procedural complication. FINDINGS: A total of approximately 4.4 L of icteric fluid was removed. IMPRESSION: Successful ultrasound-guided paracentesis yielding 4.4 liters of peritoneal fluid. Read by: Saverio Danker, PA-C Electronically Signed   By: Jerilynn Mages.  Shick M.D.   On: 01/15/2017 15:05   US Liver Doppler  Result Date: 01/15/2017 CLINICAL DATA:  Transaminitis, effusion post paracentesis. Recent TIPS 01/06/2017. EXAM: COMPLETE ABDOMINAL ULTRASOUND US HEPATIC DOPPLER ARTERIAL/VENOUS ULTRASOUND COMPARISON:  MR 12/17/2016 FINDINGS: Gallbladder: Echogenic calculi in the dependent aspect of the gallbladder near the neck, measured up to 0.8 cm diameter. No wall thickening. No sonographic Murphy sign per  technologist. Common bile duct:  Normal in caliber, 3.36m diameter. Liver: Nodular parenchymal contour and coarse echotexture. No focal lesion. Portal Vein Velocities Main:  41 cm/sec, hepatopetal Right: 24 cm/sec, hepatofugal Left:  6 cm/sec, hepatofugal TIPS stent velocities  (all antegrade): Proximal 119 cm/sec Mid 134 cm/sec Distal 100 cm/sec Hepatic Vein Velocities (all hepatofugal) Right:  24 cm/sec Middle:  26 cm/sec Left:  50  cm/sec Hepatic Artery Velocity: 118/19 cm/sec IVC:  Negative Pancreas: Visualized segments unremarkable, portions obscured by overlying bowel gas. Spleen:  No focal lesion, 13.1 x 5.7 x 12.6 cm (volume = 490 cm^3) Splenic Vein Velocity: 36 cm/sec Superior mesenteric vein velocity 33 cm/second Right Kidney:  No mass or hydronephrosis, 10.4cm in length. Left Kidney:  No lesion or hydronephrosis, 10cm in length. Abdominal aorta: Visualized segments unremarkable, portions obscured by overlying bowel gas. Varices: None identified Ascites: Present Bilateral pleural effusions noted. IMPRESSION: 1. Patent TIPS stent with reversal of flow within the right and left branches of the portal vein. 2. Nodular liver, splenomegaly, and ascites consistent with cirrhosis and portal venous hypertension. 3. Cholelithiasis without other ultrasound evidence of cholecystitis or biliary obstruction. Electronically Signed   By: DLucrezia EuropeM.D.   On: 01/15/2017 17:03        Scheduled Meds: . diltiazem  120 mg Oral Daily  . furosemide  40 mg Oral Daily  . insulin aspart  0-9 Units Subcutaneous TID WC  . lactulose  20 g Oral Daily  .  lactulose  20 g Oral Once  . sodium chloride flush  3 mL Intravenous Q12H  . spironolactone  50 mg Oral Daily   Continuous Infusions: . sodium chloride       LOS: 2 days     Cordelia Poche, MD [facility] 01/16/2017, 11:33 AM Pager: [phone]  If 7PM-7AM, please contact night-coverage [email or web address] Password [email or web address] 01/16/2017, 11:33 AM

## 2017-01-16 NOTE — Progress Notes (Signed)
PT Cancellation Note  Patient Details Name: Cynthia Long MRN: [id number] DOB: 14-Dec-1940   Cancelled Treatment:    Reason Eval/Treat Not Completed: Fatigue/lethargy limiting ability to participate Pt just getting back to bed and asking for PT to come back later. Will reattempt as schedule allows.   Leighton Ruff, PT, DPT  Acute Rehabilitation Services  Pager: [phone]    Rudean Hitt 01/16/2017, 11:06 AM

## 2017-01-16 NOTE — Progress Notes (Signed)
Patient ID: Cynthia Long, female   DOB: [birth year], 76 y.o.   MRN: [id number]    Referring Physician(s): Dr. Carlean Purl  Supervising Physician: Markus Daft  Patient Status: [facility] - In-pt  Chief Complaint: S/p TIPS  Subjective: Patient complains of some crampy abdominal pain after para yesterday.  Otherwise no new complaints.  Allergies: Biaxin [clarithromycin] and Sulfa antibiotics  Medications: Prior to Admission medications   Medication Sig Start Date End Date Taking? Authorizing Provider  acetaminophen (TYLENOL) 325 MG tablet Take 325 mg by mouth every 6 (six) hours as needed for mild pain or headache.   Yes [provider]  budesonide (PULMICORT) 0.5 MG/2ML nebulizer solution Take 0.5 mg by nebulization daily as needed (for shortness of breath).   Yes [provider]  CALCIUM LACTATE PO Take 6 tablets by mouth every evening.   Yes [provider]  diltiazem (CARDIZEM CD) 120 MG 24 hr capsule Take 1 capsule daily by mouth. 11/05/16  Yes [provider]  furosemide (LASIX) 40 MG tablet Take 40 mg by mouth daily.  11/11/16  Yes [provider]  glimepiride (AMARYL) 4 MG tablet Take 4 mg by mouth daily with breakfast.    Yes [provider]  lactulose (CHRONULAC) 10 GM/15ML solution Take 30-40 mLs daily by mouth. 01/08/17  Yes [provider]  Lancets (ONETOUCH ULTRASOFT) lancets 1 each by Other route 2 (two) times daily.  04/23/14  Yes [provider]  ondansetron (ZOFRAN ODT) 4 MG disintegrating tablet Take 1 tablet (4 mg total) every 6 (six) hours as needed by mouth for nausea or vomiting. 01/13/17  Yes Gatha Mayer, MD  PRESCRIPTION MEDICATION every 7 (seven) days. Allergy Shots    Yes [provider]  saxagliptin HCl (ONGLYZA) 5 MG TABS tablet Take 5 mg by mouth daily.   Yes [provider]  spironolactone (ALDACTONE) 50 MG tablet Take 50 mg by mouth daily.  11/11/16  Yes [provider]    VENTOLIN HFA 108 (90 Base) MCG/ACT inhaler Inhale 2 puffs into the lungs every 6 (six) hours as needed for wheezing or shortness of breath.  10/14/16  Yes [provider]  diltiazem (DILACOR XR) 120 MG 24 hr capsule Take 120 mg by mouth daily.    [provider]    Vital Signs: BP (!) 112/39 (BP Location: Right Arm)   Pulse (!) 54   Temp 98.2 F (36.8 C) (Oral)   Resp 16   Ht 5\' 2"  (1.575 m)   Wt 135 lb (61.2 kg)   SpO2 96%   BMI 24.69 kg/m   Physical Exam: Abd: soft, minimally tender, +BS, ND  Imaging: Dg Chest 2 View  Result Date: 01/14/2017 CLINICAL DATA:  Nausea and vomiting, history of recent TIPS EXAM: CHEST  2 VIEW COMPARISON:  08/14/2013, 12/17/2016 FINDINGS: Cardiac shadow is mildly prominent. Left pleural effusion with underlying atelectasis/infiltrate is noted. This is new from the most recent exam of 12/17/2016. Mild right basilar atelectasis is noted as well. A small right pleural effusion is also seen. Changes consistent with recent TIPS are noted. IMPRESSION: Bilateral pleural effusions with atelectatic changes left significantly greater than right. Electronically Signed   By: Inez Catalina M.D.   On: 01/14/2017 14:37   US Abdomen Complete  Result Date: 01/15/2017 CLINICAL DATA:  Transaminitis, effusion post paracentesis. Recent TIPS 01/06/2017. EXAM: COMPLETE ABDOMINAL ULTRASOUND US HEPATIC DOPPLER ARTERIAL/VENOUS ULTRASOUND COMPARISON:  MR 12/17/2016 FINDINGS: Gallbladder: Echogenic calculi in the dependent aspect of the gallbladder  near the neck, measured up to 0.8 cm diameter. No wall thickening. No sonographic Murphy sign per technologist. Common bile duct:  Normal in caliber, 3.50mm diameter. Liver: Nodular parenchymal contour and coarse echotexture. No focal lesion. Portal Vein Velocities Main:  41 cm/sec, hepatopetal Right: 24 cm/sec, hepatofugal Left:  6 cm/sec, hepatofugal TIPS stent velocities  (all antegrade): Proximal 119 cm/sec Mid 134 cm/sec  Distal 100 cm/sec Hepatic Vein Velocities (all hepatofugal) Right:  24 cm/sec Middle:  26 cm/sec Left:  50  cm/sec Hepatic Artery Velocity: 118/19 cm/sec IVC:  Negative Pancreas: Visualized segments unremarkable, portions obscured by overlying bowel gas. Spleen:  No focal lesion, 13.1 x 5.7 x 12.6 cm (volume = 490 cm^3) Splenic Vein Velocity: 36 cm/sec Superior mesenteric vein velocity 33 cm/second Right Kidney:  No mass or hydronephrosis, 10.4cm in length. Left Kidney:  No lesion or hydronephrosis, 10cm in length. Abdominal aorta: Visualized segments unremarkable, portions obscured by overlying bowel gas. Varices: None identified Ascites: Present Bilateral pleural effusions noted. IMPRESSION: 1. Patent TIPS stent with reversal of flow within the right and left branches of the portal vein. 2. Nodular liver, splenomegaly, and ascites consistent with cirrhosis and portal venous hypertension. 3. Cholelithiasis without other ultrasound evidence of cholecystitis or biliary obstruction. Electronically Signed   By: Lucrezia Europe M.D.   On: 01/15/2017 17:03   US Paracentesis  Result Date: 01/15/2017 INDICATION: History of NASH cirrhosis with recurrent abdominal ascites. The patient is status post TIPS on January 06, 2017. Request has been made for paracentesis. EXAM: ULTRASOUND GUIDED THERAPEUTIC PARACENTESIS MEDICATIONS: 1% xylocaine COMPLICATIONS: None immediate. PROCEDURE: Informed written consent was obtained from the patient after a discussion of the risks, benefits and alternatives to treatment. A timeout was performed prior to the initiation of the procedure. Initial ultrasound scanning demonstrates a moderate amount of ascites within the right lower abdominal quadrant. The right lower abdomen was prepped and draped in the usual sterile fashion. 1% xylocaine was used for local anesthesia. Following this, a 19 gauge, 7-cm, Yueh catheter was introduced. An ultrasound image was saved for documentation purposes. The  paracentesis was performed. The catheter was removed and a dressing was applied. The patient tolerated the procedure well without immediate post procedural complication. FINDINGS: A total of approximately 4.4 L of icteric fluid was removed. IMPRESSION: Successful ultrasound-guided paracentesis yielding 4.4 liters of peritoneal fluid. Read by: Saverio Danker, PA-C Electronically Signed   By: Jerilynn Mages.  Shick M.D.   On: 01/15/2017 15:05   US Liver Doppler  Result Date: 01/15/2017 CLINICAL DATA:  Transaminitis, effusion post paracentesis. Recent TIPS 01/06/2017. EXAM: COMPLETE ABDOMINAL ULTRASOUND US HEPATIC DOPPLER ARTERIAL/VENOUS ULTRASOUND COMPARISON:  MR 12/17/2016 FINDINGS: Gallbladder: Echogenic calculi in the dependent aspect of the gallbladder near the neck, measured up to 0.8 cm diameter. No wall thickening. No sonographic Murphy sign per technologist. Common bile duct:  Normal in caliber, 3.41mm diameter. Liver: Nodular parenchymal contour and coarse echotexture. No focal lesion. Portal Vein Velocities Main:  41 cm/sec, hepatopetal Right: 24 cm/sec, hepatofugal Left:  6 cm/sec, hepatofugal TIPS stent velocities  (all antegrade): Proximal 119 cm/sec Mid 134 cm/sec Distal 100 cm/sec Hepatic Vein Velocities (all hepatofugal) Right:  24 cm/sec Middle:  26 cm/sec Left:  50  cm/sec Hepatic Artery Velocity: 118/19 cm/sec IVC:  Negative Pancreas: Visualized segments unremarkable, portions obscured by overlying bowel gas. Spleen:  No focal lesion, 13.1 x 5.7 x 12.6 cm (volume = 490 cm^3) Splenic Vein Velocity: 36 cm/sec Superior mesenteric vein velocity 33 cm/second Right Kidney:  No mass or hydronephrosis, 10.4cm in length. Left Kidney:  No lesion or hydronephrosis, 10cm in length. Abdominal aorta: Visualized segments unremarkable, portions obscured by overlying bowel gas. Varices: None identified Ascites: Present Bilateral pleural effusions noted. IMPRESSION: 1. Patent TIPS stent with reversal of flow within the right  and left branches of the portal vein. 2. Nodular liver, splenomegaly, and ascites consistent with cirrhosis and portal venous hypertension. 3. Cholelithiasis without other ultrasound evidence of cholecystitis or biliary obstruction. Electronically Signed   By: Lucrezia Europe M.D.   On: 01/15/2017 17:03    Labs:  CBC: Recent Labs    01/07/17 0539 01/14/17 1344 01/15/17 0542 01/16/17 0541  WBC 8.8 11.2* 6.7 9.5  HGB 10.1* 13.0 10.4* 10.4*  HCT 29.4* 36.3 29.5* 30.9*  PLT 95* 86* 53* 57*    COAGS: Recent Labs    11/14/16 1144 12/11/16 1126 01/06/17 1200 01/14/17 1344  INR 1.2* 1.0 1.32 1.18  APTT  --   --  28  --     BMP: Recent Labs    01/07/17 0539 01/14/17 1344 01/15/17 0542 01/16/17 0541  NA 131* 123* 127* 132*  K 3.6 4.5 3.9 4.0  CL 105 93* 99* 101  CO2 18* 20* 21* 25  GLUCOSE 57* 110* 166* 58*  BUN 13 24* 17 13  CALCIUM 7.9* 8.7* 8.2* 8.0*  CREATININE 0.96 1.16* 1.12* 1.22*  GFRNONAA 57* 46* 48* 43*  GFRAA >60 53* 55* 50*    LIVER FUNCTION TESTS: Recent Labs    01/07/17 0539 01/14/17 1344 01/15/17 0542 01/16/17 0541  BILITOT 3.0* 7.2* 5.7* 6.3*  AST 130* 118* 86* 107*  ALT 89* 213* 142* 117*  ALKPHOS 138* 349* 280* 278*  PROT 4.6* 5.9* 4.4* 4.5*  ALBUMIN 2.7* 3.0* 2.2* 2.5*    Assessment and Plan: 1. NASH, s/p TIPS with elevated LFTs  Dr. Anselm Pancoast has reviewed the US doppler and the TIPS looks good.  However, given her elevated LFTs if her labs worsen further or she does not resolve this elevation, then she may require her TIPS to be occluded.  For now we will watch her LFTS and hope they resolve.  We will follow closely.  Electronically Signed: Henreitta Cea 01/16/2017, 2:12 PM   I spent a total of 15 Minutes at the the patient's bedside AND on the patient's hospital floor or unit, greater than 50% of which was counseling/coordinating care for elevated LFTs s/p TIPS

## 2017-01-16 NOTE — Progress Notes (Signed)
Physical Therapy Treatment Patient Details Name: Cynthia Long MRN: [id number] DOB: Mar 10, 1940 Today's Date: 01/16/2017    History of Present Illness Pt is a 76 y/o female admitted secondary to nausea and increased abdominal swelling. Pt recently underwent IR TIPS procedure and IR paracentesis on 11/5. Imaging revealed bilat pleural effusions and increased fluid in abdomen. Pt had R lower abdominal quadrant US paracentesis on 11/14. PMH includes GI bleed, HTN, DM, cirrhosis from NASH, and IR paracentesis and IR TIPS procedure.     PT Comments    Pt progressing towards goals. Able to increase gait tolerance this session, however, required encouragement to participate. Continues to be limited in gait distance secondary to fatigue. Pt also continues to be unsteady with ambulation requiring min to min guard assist for balance. Discussed disposition with pt and pt reports she would like to go home. Continue to feel pt would benefit from short term SNF at d/c to increase independence and safety with functional mobility, however, if pt decides to go home will need HHPT and DME below. Will continue to follow acutely to maximize funcitonal mobility independence and safety.   Follow Up Recommendations  SNF;Supervision/Assistance - 24 hour     Equipment Recommendations  Rolling walker with 5" wheels;3in1 (PT)    Recommendations for Other Services       Precautions / Restrictions Precautions Precautions: Fall Restrictions Weight Bearing Restrictions: No    Mobility  Bed Mobility Overal bed mobility: Needs Assistance Bed Mobility: Supine to Sit;Sit to Supine     Supine to sit: Min assist Sit to supine: Supervision   General bed mobility comments: Min A for trunk elevation. Use of bed rails and elevated HOB.   Transfers Overall transfer level: Needs assistance Equipment used: 1 person hand held assist Transfers: Sit to/from Stand Sit to Stand: Min guard         General transfer  comment: Min guard for steadying assist. Reporting some grogginess secondary to medicines she took.   Ambulation/Gait Ambulation/Gait assistance: Min assist;Min guard Ambulation Distance (Feet): 100 Feet Assistive device: (rail in hallway) Gait Pattern/deviations: Step-through pattern;Decreased stride length;Drifts right/left Gait velocity: Decreased Gait velocity interpretation: Below normal speed for age/gender General Gait Details:  Required max encouragement to participate in gait training this session. Slow, unsteady gait. Able to increase distance, however, continues to be limited secondary to [place]. Pt reaching for rail in hallway. LOB X 3 during gait and required min A for steadying.   Stairs            Wheelchair Mobility    Modified Rankin (Stroke Patients Only)       Balance Overall balance assessment: Needs assistance Sitting-balance support: No upper extremity supported;Feet supported Sitting balance-Leahy Scale: Fair     Standing balance support: No upper extremity supported;Single extremity supported;During functional activity Standing balance-Leahy Scale: Poor Standing balance comment: Reliant on external support for balance. LOB X 3 during gait.                             Cognition Arousal/Alertness: Awake/alert Behavior During Therapy: WFL for tasks assessed/performed Overall Cognitive Status: Within Functional Limits for tasks assessed                                        Exercises General Exercises - Lower Extremity Ankle Circles/Pumps: AROM;Both;20 reps Quad Sets: AROM;Both;10 reps  Gluteal Sets: AROM;Both;10 reps Heel Slides: AROM;Both;10 reps Hip ABduction/ADduction: AROM;Both;10 reps    General Comments General comments (skin integrity, edema, etc.): Pt would like to go home at d/c, despite education about short term SNF. No family present to discuss disposition.       Pertinent Vitals/Pain Pain  Assessment: Faces Faces Pain Scale: Hurts little more Pain Location: abdomen  Pain Descriptors / Indicators: Discomfort Pain Intervention(s): Limited activity within patient's tolerance;Monitored during session;Repositioned    Home Living                      Prior Function            PT Goals (current goals can now be found in the care plan section) Acute Rehab PT Goals Patient Stated Goal: to go home  PT Goal Formulation: With patient Time For Goal Achievement: 01/22/17 Potential to Achieve Goals: Good Progress towards PT goals: Progressing toward goals    Frequency    Min 3X/week(in case pt decides to go home )      PT Plan Current plan remains appropriate    Co-evaluation              AM-PAC PT "6 Clicks" Daily Activity  Outcome Measure  Difficulty turning over in bed (including adjusting bedclothes, sheets and blankets)?: A Little Difficulty moving from lying on back to sitting on the side of the bed? : Unable Difficulty sitting down on and standing up from a chair with arms (e.g., wheelchair, bedside commode, etc,.)?: Unable Help needed moving to and from a bed to chair (including a wheelchair)?: A Little Help needed walking in hospital room?: A Little Help needed climbing 3-5 steps with a railing? : A Lot 6 Click Score: 13    End of Session Equipment Utilized During Treatment: Gait belt Activity Tolerance: Patient limited by fatigue Patient left: in bed;with call bell/phone within reach Nurse Communication: Mobility status PT Visit Diagnosis: Unsteadiness on feet (R26.81);Muscle weakness (generalized) (M62.81);Pain Pain - part of body: (abdomen )     Time: 8527-7824 PT Time Calculation (min) (ACUTE ONLY): 26 min  Charges:  $Gait Training: 8-22 mins $Therapeutic Exercise: 8-22 mins                    G Codes:       Leighton Ruff, PT, DPT  Acute Rehabilitation Services  Pager: [phone]    Rudean Hitt 01/16/2017,  5:40 PM

## 2017-01-16 NOTE — Progress Notes (Signed)
OT Cancellation Note  Patient Details Name: Cynthia Long MRN: [id number] DOB: Apr 23, 1940   Cancelled Treatment:    Reason Eval/Treat Not Completed: Fatigue/lethargy limiting ability to participate;Other (comment). Attempted x 3 to see pt today and requested OT return later due to fatigue, preparing for lunch and with other staff  [name] 01/16/2017, 2:26 PM

## 2017-01-17 DIAGNOSIS — R1010 Upper abdominal pain, unspecified: Secondary | ICD-10-CM

## 2017-01-17 LAB — GLUCOSE, CAPILLARY
GLUCOSE-CAPILLARY: 211 mg/dL — AB (ref 65–99)
Glucose-Capillary: 111 mg/dL — ABNORMAL HIGH (ref 65–99)
Glucose-Capillary: 141 mg/dL — ABNORMAL HIGH (ref 65–99)
Glucose-Capillary: 146 mg/dL — ABNORMAL HIGH (ref 65–99)

## 2017-01-17 LAB — COMPREHENSIVE METABOLIC PANEL
ALBUMIN: 2.5 g/dL — AB (ref 3.5–5.0)
ALK PHOS: 271 U/L — AB (ref 38–126)
ALT: 105 U/L — ABNORMAL HIGH (ref 14–54)
AST: 107 U/L — AB (ref 15–41)
Anion gap: 9 (ref 5–15)
BUN: 15 mg/dL (ref 6–20)
CALCIUM: 8.1 mg/dL — AB (ref 8.9–10.3)
CO2: 25 mmol/L (ref 22–32)
Chloride: 95 mmol/L — ABNORMAL LOW (ref 101–111)
Creatinine, Ser: 1.26 mg/dL — ABNORMAL HIGH (ref 0.44–1.00)
GFR calc Af Amer: 48 mL/min — ABNORMAL LOW (ref >60)
GFR calc non Af Amer: 41 mL/min — ABNORMAL LOW (ref >60)
GLUCOSE: 146 mg/dL — AB (ref 65–99)
Potassium: 4.1 mmol/L (ref 3.5–5.1)
SODIUM: 129 mmol/L — AB (ref 135–145)
Total Bilirubin: 6.3 mg/dL — ABNORMAL HIGH (ref 0.3–1.2)
Total Protein: 4.6 g/dL — ABNORMAL LOW (ref 6.5–8.1)

## 2017-01-17 MED ORDER — LACTULOSE 10 GM/15ML PO SOLN
20.0000 g | Freq: Two times a day (BID) | ORAL | Status: DC
  Administered 2017-01-17 – 2017-01-18 (×3): 20 g via ORAL
  Filled 2017-01-17 (×3): qty 30

## 2017-01-17 NOTE — Evaluation (Signed)
Occupational Therapy Evaluation Patient Details Name: Cynthia Long MRN: [id number] DOB: 1940-04-13 Today's Date: 01/17/2017    History of Present Illness Pt is a 76 y/o female admitted secondary to nausea and increased abdominal swelling. Pt recently underwent IR TIPS procedure and IR paracentesis on 11/5. Imaging revealed bilat pleural effusions and increased fluid in abdomen. Pt had R lower abdominal quadrant US paracentesis on 11/14. PMH includes GI bleed, HTN, DM, cirrhosis from NASH, and IR paracentesis and IR TIPS procedure.    Clinical Impression   Pt admitted with the above diagnoses and presents with below problem list. Pt will benefit from continued acute OT to address the below listed deficits and maximize independence with basic ADLs prior to d/c to next venue. PTA pt reports she was independent to mod I with ADLs. Pt is currently min to mod A with LB ADLs and functional transfers/mobility. Pt tolerated session well. No family present during session.       Follow Up Recommendations  SNF;Supervision/Assistance - 24 hour    Equipment Recommendations  None recommended by OT    Recommendations for Other Services       Precautions / Restrictions Precautions Precautions: Fall Restrictions Weight Bearing Restrictions: No      Mobility Bed Mobility Overal bed mobility: Needs Assistance Bed Mobility: Supine to Sit     Supine to sit: HOB elevated;Min guard     General bed mobility comments: use of bed rails. HOB elevated.  Transfers Overall transfer level: Needs assistance Equipment used: 1 person hand held assist Transfers: Sit to/from Stand Sit to Stand: Min guard         General transfer comment: min guard for safety. from EOB, toilet, and to recliner.    Balance Overall balance assessment: Needs assistance Sitting-balance support: No upper extremity supported;Feet supported Sitting balance-Leahy Scale: Fair     Standing balance support: No upper  extremity supported;Single extremity supported;During functional activity Standing balance-Leahy Scale: Poor Standing balance comment: external support for balance particulary for dynamic standing tasks.                           ADL either performed or assessed with clinical judgement   ADL Overall ADL's : Needs assistance/impaired Eating/Feeding: Set up;Sitting   Grooming: Set up;Min guard;Sitting;Standing   Upper Body Bathing: Minimal assistance;Sitting   Lower Body Bathing: Sit to/from stand;Moderate assistance   Upper Body Dressing : Minimal assistance;Sitting   Lower Body Dressing: Sit to/from stand;Moderate assistance   Toilet Transfer: Min guard;Ambulation;Comfort height toilet;Grab bars   Toileting- Clothing Manipulation and Hygiene: Minimal assistance;Min guard;Sit to/from stand;Sitting/lateral lean   Tub/ Shower Transfer: Walk-in shower;Min guard;Ambulation;Shower seat;Grab Paediatric nurse Details (indicate cue type and reason): practice with simulated walkin shower Functional mobility during ADLs: Minimal assistance;Min guard General ADL Comments: Pt with difficulty raising B feet to access for LB ADLs in seated position. Educated on AE and techniques to avoid bending at waist. Seeks external support during functional transfers/mobility.      Vision         Perception     Praxis      Pertinent Vitals/Pain Pain Assessment: Faces Faces Pain Scale: Hurts little more Pain Location: abdomen  Pain Descriptors / Indicators: Discomfort Pain Intervention(s): Limited activity within patient's tolerance;Monitored during session;Repositioned     Hand Dominance Right   Extremity/Trunk Assessment Upper Extremity Assessment Upper Extremity Assessment: Overall WFL for tasks assessed;Generalized weakness   Lower Extremity Assessment Lower Extremity Assessment:  Defer to PT evaluation   Cervical / Trunk Assessment Cervical / Trunk Assessment:  Kyphotic   Communication Communication Communication: No difficulties   Cognition Arousal/Alertness: Awake/alert Behavior During Therapy: WFL for tasks assessed/performed Overall Cognitive Status: Within Functional Limits for tasks assessed                                     General Comments  Pt would like to go home at d/c, despite education about short term SNF. No family present to discuss disposition.     Exercises     Shoulder Instructions      Home Living Family/patient expects to be discharged to:: Private residence Living Arrangements: Children Available Help at Discharge: Family;Available PRN/intermittently Type of Home: House Home Access: Stairs to enter [employer] of Steps: 10 Entrance Stairs-Rails: Right Home Layout: Two level;Laundry or work area in Building surveyor of Steps: flight  Alternate Level Stairs-Rails: Right Bathroom Shower/Tub: Occupational psychologist: Handicapped height     Home Equipment: Civil engineer, contracting - built in   Additional Comments: Daughter available during the day       Prior Functioning/Environment Level of Independence: Needs assistance  Gait / Transfers Assistance Needed: Per family pt required assist with walking throughout home ADL's / Homemaking Assistance Needed: Required assist from daughter for bathing and dressing.    Comments: Was very independent until about September. Working 5 days/week up until september.         OT Problem List: Decreased activity tolerance;Impaired balance (sitting and/or standing);Decreased strength;Decreased knowledge of use of DME or AE;Decreased knowledge of precautions;Pain      OT Treatment/Interventions: Self-care/ADL training;Therapeutic exercise;Energy conservation;DME and/or AE instruction;Therapeutic activities;Patient/family education;Balance training    OT Goals(Current goals can be found in the care plan section) Acute Rehab OT  Goals Patient Stated Goal: to go home  OT Goal Formulation: With patient Time For Goal Achievement: 01/31/17 Potential to Achieve Goals: Good ADL Goals Pt Will Perform Lower Body Bathing: with modified independence;with adaptive equipment;sit to/from stand Pt Will Perform Lower Body Dressing: with modified independence;with adaptive equipment;sit to/from stand Pt Will Transfer to Toilet: with modified independence;ambulating Pt Will Perform Toileting - Clothing Manipulation and hygiene: with modified independence;sit to/from stand  OT Frequency: Min 2X/week   Barriers to D/C:    Pt's daughter works out of [employer]. Pt usually home alone at night.        Co-evaluation              AM-PAC PT "6 Clicks" Daily Activity     Outcome Measure Help from another person eating meals?: None Help from another person taking care of personal grooming?: A Little Help from another person toileting, which includes using toliet, bedpan, or urinal?: A Little Help from another person bathing (including washing, rinsing, drying)?: A Lot Help from another person to put on and taking off regular upper body clothing?: A Little Help from another person to put on and taking off regular lower body clothing?: A Lot 6 Click Score: 17   End of Session    Activity Tolerance: Patient tolerated treatment well Patient left: in chair;with call bell/phone within reach  OT Visit Diagnosis: Unsteadiness on feet (R26.81);Pain;Muscle weakness (generalized) (M62.81)                Time: 1139-1150 OT Time Calculation (min): 11 min Charges:  OT General Charges $OT Visit:  1 Visit OT Evaluation $OT Eval Low Complexity: 1 Low G-Codes:       Hortencia Pilar 01/17/2017, 12:04 PM

## 2017-01-17 NOTE — Progress Notes (Signed)
PROGRESS NOTE    Delane Stalling  TKZ:[id number] DOB: 12-28-1940 DOA: 01/14/2017 PCP: Macon Large, MD   Brief Narrative: Marcianne Ozbun is a 76 y.o. female with a history of nonalcoholic steatohepatitis, diabetes mellitus, gastritis, esophageal varices.  Patient presented with symptoms of anorexia and fatigue.  Patient recently had a TIPS procedure on Monday, 01/06/2017 she was found to have acute hepatic failure. GI was consulted and recommended transfer to [facility] for continued management.  Paracentesis ordered for ascites.   Assessment & Plan:   Active Problems:   HTN (hypertension)   Hepatic cirrhosis (HCC)   Ascites   Uncontrolled type 2 diabetes mellitus , without long-term current use of insulin ([place])   Acute hepatic failure Nonalcoholic steatohepatitis Patient with a recent TIPS procedure. Followed outpatient by gastroenterologists in [place] but is wanting to transition care to [facility] (has seen them in the past). AST and bilirubin increased slightly. ALT decreased and alk phos stable. -GI recommendations: lasix/spironolactone -continue lactulose -may need to consider palliative care consult depending on GI's recommendation  Ascites Secondary to hepatic failure. S/p paracentesis with 4.4L removed. No abdominal pain or confusion to suspect SBP.  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 No evidence of bleeding. Stable. -trend CBC  Acute kidney injury Secondary to fluid overload in setting of acute hepatic failure. Improved initially and slightly worsened -will be very difficult to manage fluid appropriately. dieretics as above -repeat metabolic panel in AM  Hyponatremia Hypervolemic hyponatremia secondary to cirrhosis and acute hepatic failure. Slightly worsened today. As mentioned above, fluid status will be very difficult to manage. -repeat metabolic panel in AM  Essential hypertension Blood pressure on lower side. Will need to watch for  hypotension in setting of diuresis and fluid overload. -continue diltiazem  Diabetes mellitus, type 2 Patient on glimepiride and Onglyza as an outpatient. Labile blood sugar secondary to hepatic failure. Hemoglobin A1C of 5.4% -discontinue glimepiride and Onglyza on discharge -continue SSI  Abdominal cramping Secondary to recent paracentesis. Resolved. -continue Tramadol prn   DVT prophylaxis: SCDs Code Status: DNR Family Communication: Son and son in law Disposition Plan: Discharge pending medical improvement and PT/OT recommendatinos   Consultants:   Gastroenterology  Interventional radiology  Procedures:   None  Antimicrobials:  None    Subjective: Some mild abdominal soreness from a "coughing fit" yesterday.   Objective: Vitals:   01/15/17 2117 01/16/17 0444 01/16/17 2207 01/17/17 0449  BP: (!) 106/31 (!) 112/39 (!) 102/34 (!) 104/34  Pulse: 68 (!) 54 (!) 58 (!) 59  Resp: _0 Temp: 98.7 F (37.1 C) 98.2 F (36.8 C) 98.3 F (36.8 C) 98.3 F (36.8 C)  TempSrc: Oral Oral Oral Oral  SpO2: 96% 96% 96% 96%  Weight:      Height:        Intake/Output Summary (Last 24 hours) at 01/17/2017 1101 Last data filed at 01/17/2017 0453 Gross per 24 hour  Intake 420 ml  Output -  Net 420 ml   Filed Weights   01/14/17 1113  Weight: 61.2 kg (135 lb)    Examination:  General exam: Appears calm and comfortable Respiratory system: Clear to auscultation. Respiratory effort normal. Cardiovascular system: S1 & S2 heard, RRR. 2/6 systolic murmur Gastrointestinal system: Soft, nontender, normal bowel sounds, distended. Central nervous system: Alert and oriented. No focal neurological deficits. Extremities: No edema. No calf tenderness Skin: No cyanosis. No rashes Psychiatry: Judgement and insight appear normal. Mood & affect appropriate.  Data Reviewed: I have personally reviewed following labs and imaging studies  CBC: Recent Labs  Lab  01/14/17 1344 01/15/17 0542 01/16/17 0541  WBC 11.2* 6.7 9.5  NEUTROABS 9.2*  --   --   HGB 13.0 10.4* 10.4*  HCT 36.3 29.5* 30.9*  MCV 87.9 87.5 90.4  PLT 86* 53* 57*   Basic Metabolic Panel: Recent Labs  Lab 01/14/17 1344 01/15/17 0542 01/16/17 0541 01/17/17 0334  NA 123* 127* 132* 129*  K 4.5 3.9 4.0 4.1  CL 93* 99* 101 95*  CO2 20* 21* 25 25  GLUCOSE 110* 166* 58* 146*  BUN 24* _0 CREATININE 1.16* 1.12* 1.22* 1.26*  CALCIUM 8.7* 8.2* 8.0* 8.1*   GFR: Estimated Creatinine Clearance: 34.2 mL/min (A) (by C-G formula based on SCr of 1.26 mg/dL (H)). Liver Function Tests: Recent Labs  Lab 01/14/17 1344 01/15/17 0542 01/16/17 0541 01/17/17 0334  AST 118* 86* 107* 107*  ALT 213* 142* 117* 105*  ALKPHOS 349* 280* 278* 271*  BILITOT 7.2* 5.7* 6.3* 6.3*  PROT 5.9* 4.4* 4.5* 4.6*  ALBUMIN 3.0* 2.2* 2.5* 2.5*   No results for input(s): LIPASE, AMYLASE in the last 168 hours. Recent Labs  Lab 01/14/17 1432  AMMONIA 32   Coagulation Profile: Recent Labs  Lab 01/14/17 1344  INR 1.18   Cardiac Enzymes: No results for input(s): CKTOTAL, CKMB, CKMBINDEX, TROPONINI in the last 168 hours. BNP (last 3 results) No results for input(s): PROBNP in the last 8760 hours. HbA1C: Recent Labs    01/15/17 0544  HGBA1C 5.4   CBG: Recent Labs  Lab 01/15/17 1737 01/16/17 0020 01/16/17 1216 01/16/17 1748 01/17/17 0650  GLUCAP 75 76 99 133* 111*   Lipid Profile: No results for input(s): CHOL, HDL, LDLCALC, TRIG, CHOLHDL, LDLDIRECT in the last 72 hours. Thyroid Function Tests: No results for input(s): TSH, T4TOTAL, FREET4, T3FREE, THYROIDAB in the last 72 hours. Anemia Panel: No results for input(s): VITAMINB12, FOLATE, FERRITIN, TIBC, IRON, RETICCTPCT in the last 72 hours. Sepsis Labs: No results for input(s): PROCALCITON, LATICACIDVEN in the last 168 hours.  No results found for this or any previous visit (from the past 240 hour(s)).       Radiology  Studies: US Abdomen Complete  Result Date: 01/15/2017 CLINICAL DATA:  Transaminitis, effusion post paracentesis. Recent TIPS 01/06/2017. EXAM: COMPLETE ABDOMINAL ULTRASOUND US HEPATIC DOPPLER ARTERIAL/VENOUS ULTRASOUND COMPARISON:  MR 12/17/2016 FINDINGS: Gallbladder: Echogenic calculi in the dependent aspect of the gallbladder near the neck, measured up to 0.8 cm diameter. No wall thickening. No sonographic Murphy sign per technologist. Common bile duct:  Normal in caliber, 3.67m diameter. Liver: Nodular parenchymal contour and coarse echotexture. No focal lesion. Portal Vein Velocities Main:  41 cm/sec, hepatopetal Right: 24 cm/sec, hepatofugal Left:  6 cm/sec, hepatofugal TIPS stent velocities  (all antegrade): Proximal 119 cm/sec Mid 134 cm/sec Distal 100 cm/sec Hepatic Vein Velocities (all hepatofugal) Right:  24 cm/sec Middle:  26 cm/sec Left:  50  cm/sec Hepatic Artery Velocity: 118/19 cm/sec IVC:  Negative Pancreas: Visualized segments unremarkable, portions obscured by overlying bowel gas. Spleen:  No focal lesion, 13.1 x 5.7 x 12.6 cm (volume = 490 cm^3) Splenic Vein Velocity: 36 cm/sec Superior mesenteric vein velocity 33 cm/second Right Kidney:  No mass or hydronephrosis, 10.4cm in length. Left Kidney:  No lesion or hydronephrosis, 10cm in length. Abdominal aorta: Visualized segments unremarkable, portions obscured by overlying bowel gas. Varices: None identified Ascites: Present Bilateral pleural effusions noted. IMPRESSION: 1. Patent TIPS  stent with reversal of flow within the right and left branches of the portal vein. 2. Nodular liver, splenomegaly, and ascites consistent with cirrhosis and portal venous hypertension. 3. Cholelithiasis without other ultrasound evidence of cholecystitis or biliary obstruction. Electronically Signed   By: Lucrezia Europe M.D.   On: 01/15/2017 17:03   US Paracentesis  Result Date: 01/15/2017 INDICATION: History of NASH cirrhosis with recurrent abdominal ascites. The  patient is status post TIPS on January 06, 2017. Request has been made for paracentesis. EXAM: ULTRASOUND GUIDED THERAPEUTIC PARACENTESIS MEDICATIONS: 1% xylocaine COMPLICATIONS: None immediate. PROCEDURE: Informed written consent was obtained from the patient after a discussion of the risks, benefits and alternatives to treatment. A timeout was performed prior to the initiation of the procedure. Initial ultrasound scanning demonstrates a moderate amount of ascites within the right lower abdominal quadrant. The right lower abdomen was prepped and draped in the usual sterile fashion. 1% xylocaine was used for local anesthesia. Following this, a 19 gauge, 7-cm, Yueh catheter was introduced. An ultrasound image was saved for documentation purposes. The paracentesis was performed. The catheter was removed and a dressing was applied. The patient tolerated the procedure well without immediate post procedural complication. FINDINGS: A total of approximately 4.4 L of icteric fluid was removed. IMPRESSION: Successful ultrasound-guided paracentesis yielding 4.4 liters of peritoneal fluid. Read by: Saverio Danker, PA-C Electronically Signed   By: Jerilynn Mages.  Shick M.D.   On: 01/15/2017 15:05   US Liver Doppler  Result Date: 01/15/2017 CLINICAL DATA:  Transaminitis, effusion post paracentesis. Recent TIPS 01/06/2017. EXAM: COMPLETE ABDOMINAL ULTRASOUND US HEPATIC DOPPLER ARTERIAL/VENOUS ULTRASOUND COMPARISON:  MR 12/17/2016 FINDINGS: Gallbladder: Echogenic calculi in the dependent aspect of the gallbladder near the neck, measured up to 0.8 cm diameter. No wall thickening. No sonographic Murphy sign per technologist. Common bile duct:  Normal in caliber, 3.46m diameter. Liver: Nodular parenchymal contour and coarse echotexture. No focal lesion. Portal Vein Velocities Main:  41 cm/sec, hepatopetal Right: 24 cm/sec, hepatofugal Left:  6 cm/sec, hepatofugal TIPS stent velocities  (all antegrade): Proximal 119 cm/sec Mid 134 cm/sec  Distal 100 cm/sec Hepatic Vein Velocities (all hepatofugal) Right:  24 cm/sec Middle:  26 cm/sec Left:  50  cm/sec Hepatic Artery Velocity: 118/19 cm/sec IVC:  Negative Pancreas: Visualized segments unremarkable, portions obscured by overlying bowel gas. Spleen:  No focal lesion, 13.1 x 5.7 x 12.6 cm (volume = 490 cm^3) Splenic Vein Velocity: 36 cm/sec Superior mesenteric vein velocity 33 cm/second Right Kidney:  No mass or hydronephrosis, 10.4cm in length. Left Kidney:  No lesion or hydronephrosis, 10cm in length. Abdominal aorta: Visualized segments unremarkable, portions obscured by overlying bowel gas. Varices: None identified Ascites: Present Bilateral pleural effusions noted. IMPRESSION: 1. Patent TIPS stent with reversal of flow within the right and left branches of the portal vein. 2. Nodular liver, splenomegaly, and ascites consistent with cirrhosis and portal venous hypertension. 3. Cholelithiasis without other ultrasound evidence of cholecystitis or biliary obstruction. Electronically Signed   By: DLucrezia EuropeM.D.   On: 01/15/2017 17:03        Scheduled Meds: . diltiazem  120 mg Oral Daily  . furosemide  20 mg Oral Daily  . insulin aspart  0-9 Units Subcutaneous TID WC  . lactulose  20 g Oral BID  . sodium chloride flush  3 mL Intravenous Q12H  . spironolactone  50 mg Oral Daily   Continuous Infusions: . sodium chloride       LOS: 3 days     RDeidre Ala  Lonny Prude, MD [facility] 01/17/2017, 11:01 AM Pager: [phone]  If 7PM-7AM, please contact night-coverage [email or web address] Password [email or web address] 01/17/2017, 11:01 AM

## 2017-01-17 NOTE — Progress Notes (Signed)
Referring Physician(s): Dr Darci Current  Supervising Physician: Sandi Mariscal  Patient Status:  [facility] - In-pt  Chief Complaint:  TIPS 01/06/17 Increased abd swelling and N/V   Subjective:  NASH Cirrhosis Refractory ascites TIPS 11/5 in IR Admitted with abd swelling; N/V Elevated liver functions Has been followed by GI service while inpt. Stable labs Symptoms of N/V resolved Maybe home soon  Allergies: Biaxin [clarithromycin] and Sulfa antibiotics  Medications: Prior to Admission medications   Medication Sig Start Date End Date Taking? Authorizing Provider  acetaminophen (TYLENOL) 325 MG tablet Take 325 mg by mouth every 6 (six) hours as needed for mild pain or headache.   Yes [provider]  budesonide (PULMICORT) 0.5 MG/2ML nebulizer solution Take 0.5 mg by nebulization daily as needed (for shortness of breath).   Yes [provider]  CALCIUM LACTATE PO Take 6 tablets by mouth every evening.   Yes [provider]  diltiazem (CARDIZEM CD) 120 MG 24 hr capsule Take 1 capsule daily by mouth. 11/05/16  Yes [provider]  furosemide (LASIX) 40 MG tablet Take 40 mg by mouth daily.  11/11/16  Yes [provider]  glimepiride (AMARYL) 4 MG tablet Take 4 mg by mouth daily with breakfast.    Yes [provider]  lactulose (CHRONULAC) 10 GM/15ML solution Take 30-40 mLs daily by mouth. 01/08/17  Yes [provider]  Lancets (ONETOUCH ULTRASOFT) lancets 1 each by Other route 2 (two) times daily.  04/23/14  Yes [provider]  ondansetron (ZOFRAN ODT) 4 MG disintegrating tablet Take 1 tablet (4 mg total) every 6 (six) hours as needed by mouth for nausea or vomiting. 01/13/17  Yes Gatha Mayer, MD  PRESCRIPTION MEDICATION every 7 (seven) days. Allergy Shots    Yes [provider]  saxagliptin HCl (ONGLYZA) 5 MG TABS tablet Take 5 mg by mouth daily.   Yes [provider]  spironolactone (ALDACTONE) 50  MG tablet Take 50 mg by mouth daily.  11/11/16  Yes [provider]  VENTOLIN HFA 108 (90 Base) MCG/ACT inhaler Inhale 2 puffs into the lungs every 6 (six) hours as needed for wheezing or shortness of breath.  10/14/16  Yes [provider]  diltiazem (DILACOR XR) 120 MG 24 hr capsule Take 120 mg by mouth daily.    [provider]     Vital Signs: BP (!) 104/34 (BP Location: Right Arm)   Pulse (!) 59   Temp 98.3 F (36.8 C) (Oral)   Resp 17   Ht 5\' 2"  (1.575 m)   Wt 135 lb (61.2 kg)   SpO2 96%   BMI 24.69 kg/m   Physical Exam  Constitutional: She is oriented to person, place, and time.  Cardiovascular: Normal rate and regular rhythm.  Pulmonary/Chest: Effort normal and breath sounds normal.  Abdominal: Soft. She exhibits distension. There is no tenderness.  Musculoskeletal: Normal range of motion.  Neurological: She is alert and oriented to person, place, and time.  Skin: Skin is warm and dry.  Psychiatric: She has a normal mood and affect. Her behavior is normal.  Nursing note and vitals reviewed.   Imaging: Dg Chest 2 View  Result Date: 01/14/2017 CLINICAL DATA:  Nausea and vomiting, history of recent TIPS EXAM: CHEST  2 VIEW COMPARISON:  08/14/2013, 12/17/2016 FINDINGS: Cardiac shadow is mildly prominent. Left pleural effusion with underlying atelectasis/infiltrate is noted. This is new from the most recent exam of 12/17/2016. Mild right basilar atelectasis is noted  as well. A small right pleural effusion is also seen. Changes consistent with recent TIPS are noted. IMPRESSION: Bilateral pleural effusions with atelectatic changes left significantly greater than right. Electronically Signed   By: Inez Catalina M.D.   On: 01/14/2017 14:37   US Abdomen Complete  Result Date: 01/15/2017 CLINICAL DATA:  Transaminitis, effusion post paracentesis. Recent TIPS 01/06/2017. EXAM: COMPLETE ABDOMINAL ULTRASOUND US HEPATIC DOPPLER ARTERIAL/VENOUS ULTRASOUND  COMPARISON:  MR 12/17/2016 FINDINGS: Gallbladder: Echogenic calculi in the dependent aspect of the gallbladder near the neck, measured up to 0.8 cm diameter. No wall thickening. No sonographic Murphy sign per technologist. Common bile duct:  Normal in caliber, 3.38mm diameter. Liver: Nodular parenchymal contour and coarse echotexture. No focal lesion. Portal Vein Velocities Main:  41 cm/sec, hepatopetal Right: 24 cm/sec, hepatofugal Left:  6 cm/sec, hepatofugal TIPS stent velocities  (all antegrade): Proximal 119 cm/sec Mid 134 cm/sec Distal 100 cm/sec Hepatic Vein Velocities (all hepatofugal) Right:  24 cm/sec Middle:  26 cm/sec Left:  50  cm/sec Hepatic Artery Velocity: 118/19 cm/sec IVC:  Negative Pancreas: Visualized segments unremarkable, portions obscured by overlying bowel gas. Spleen:  No focal lesion, 13.1 x 5.7 x 12.6 cm (volume = 490 cm^3) Splenic Vein Velocity: 36 cm/sec Superior mesenteric vein velocity 33 cm/second Right Kidney:  No mass or hydronephrosis, 10.4cm in length. Left Kidney:  No lesion or hydronephrosis, 10cm in length. Abdominal aorta: Visualized segments unremarkable, portions obscured by overlying bowel gas. Varices: None identified Ascites: Present Bilateral pleural effusions noted. IMPRESSION: 1. Patent TIPS stent with reversal of flow within the right and left branches of the portal vein. 2. Nodular liver, splenomegaly, and ascites consistent with cirrhosis and portal venous hypertension. 3. Cholelithiasis without other ultrasound evidence of cholecystitis or biliary obstruction. Electronically Signed   By: Lucrezia Europe M.D.   On: 01/15/2017 17:03   US Paracentesis  Result Date: 01/15/2017 INDICATION: History of NASH cirrhosis with recurrent abdominal ascites. The patient is status post TIPS on January 06, 2017. Request has been made for paracentesis. EXAM: ULTRASOUND GUIDED THERAPEUTIC PARACENTESIS MEDICATIONS: 1% xylocaine COMPLICATIONS: None immediate. PROCEDURE: Informed written  consent was obtained from the patient after a discussion of the risks, benefits and alternatives to treatment. A timeout was performed prior to the initiation of the procedure. Initial ultrasound scanning demonstrates a moderate amount of ascites within the right lower abdominal quadrant. The right lower abdomen was prepped and draped in the usual sterile fashion. 1% xylocaine was used for local anesthesia. Following this, a 19 gauge, 7-cm, Yueh catheter was introduced. An ultrasound image was saved for documentation purposes. The paracentesis was performed. The catheter was removed and a dressing was applied. The patient tolerated the procedure well without immediate post procedural complication. FINDINGS: A total of approximately 4.4 L of icteric fluid was removed. IMPRESSION: Successful ultrasound-guided paracentesis yielding 4.4 liters of peritoneal fluid. Read by: Saverio Danker, PA-C Electronically Signed   By: Jerilynn Mages.  Shick M.D.   On: 01/15/2017 15:05   US Liver Doppler  Result Date: 01/15/2017 CLINICAL DATA:  Transaminitis, effusion post paracentesis. Recent TIPS 01/06/2017. EXAM: COMPLETE ABDOMINAL ULTRASOUND US HEPATIC DOPPLER ARTERIAL/VENOUS ULTRASOUND COMPARISON:  MR 12/17/2016 FINDINGS: Gallbladder: Echogenic calculi in the dependent aspect of the gallbladder near the neck, measured up to 0.8 cm diameter. No wall thickening. No sonographic Murphy sign per technologist. Common bile duct:  Normal in caliber, 3.51mm diameter. Liver: Nodular parenchymal contour and coarse echotexture. No focal lesion. Portal Vein Velocities Main:  41 cm/sec, hepatopetal Right: 24 cm/sec,  hepatofugal Left:  6 cm/sec, hepatofugal TIPS stent velocities  (all antegrade): Proximal 119 cm/sec Mid 134 cm/sec Distal 100 cm/sec Hepatic Vein Velocities (all hepatofugal) Right:  24 cm/sec Middle:  26 cm/sec Left:  50  cm/sec Hepatic Artery Velocity: 118/19 cm/sec IVC:  Negative Pancreas: Visualized segments unremarkable, portions  obscured by overlying bowel gas. Spleen:  No focal lesion, 13.1 x 5.7 x 12.6 cm (volume = 490 cm^3) Splenic Vein Velocity: 36 cm/sec Superior mesenteric vein velocity 33 cm/second Right Kidney:  No mass or hydronephrosis, 10.4cm in length. Left Kidney:  No lesion or hydronephrosis, 10cm in length. Abdominal aorta: Visualized segments unremarkable, portions obscured by overlying bowel gas. Varices: None identified Ascites: Present Bilateral pleural effusions noted. IMPRESSION: 1. Patent TIPS stent with reversal of flow within the right and left branches of the portal vein. 2. Nodular liver, splenomegaly, and ascites consistent with cirrhosis and portal venous hypertension. 3. Cholelithiasis without other ultrasound evidence of cholecystitis or biliary obstruction. Electronically Signed   By: Lucrezia Europe M.D.   On: 01/15/2017 17:03    Labs:  CBC: Recent Labs    01/07/17 0539 01/14/17 1344 01/15/17 0542 01/16/17 0541  WBC 8.8 11.2* 6.7 9.5  HGB 10.1* 13.0 10.4* 10.4*  HCT 29.4* 36.3 29.5* 30.9*  PLT 95* 86* 53* 57*    COAGS: Recent Labs    11/14/16 1144 12/11/16 1126 01/06/17 1200 01/14/17 1344  INR 1.2* 1.0 1.32 1.18  APTT  --   --  28  --     BMP: Recent Labs    01/14/17 1344 01/15/17 0542 01/16/17 0541 01/17/17 0334  NA 123* 127* 132* 129*  K 4.5 3.9 4.0 4.1  CL 93* 99* 101 95*  CO2 20* 21* 25 25  GLUCOSE 110* 166* 58* 146*  BUN 24* 17 13 15   CALCIUM 8.7* 8.2* 8.0* 8.1*  CREATININE 1.16* 1.12* 1.22* 1.26*  GFRNONAA 46* 48* 43* 41*  GFRAA 53* 55* 50* 48*    LIVER FUNCTION TESTS: Recent Labs    01/14/17 1344 01/15/17 0542 01/16/17 0541 01/17/17 0334  BILITOT 7.2* 5.7* 6.3* 6.3*  AST 118* 86* 107* 107*  ALT 213* 142* 117* 105*  ALKPHOS 349* 280* 278* 271*  PROT 5.9* 4.4* 4.5* 4.6*  ALBUMIN 3.0* 2.2* 2.5* 2.5*    Assessment and Plan:  NASH cirrhosis TIPS 11/5 in IR Admitted 11/12 with new sxs; and elevated LFTs Now all sxs resolved; LFTs down and  stable Plan per GI Pt to keep 6 week follow up with Dr Laurence Ferrari Ask pt to bring labs from GI follow up to [facility] follow up (see orders)   Electronically Signed: Holli Rengel A, PA-C 01/17/2017, 12:41 PM   I spent a total of 20 min at the the patient's bedside AND on the patient's hospital floor or unit, greater than 50% of which was counseling/coordinating care for TIPS

## 2017-01-17 NOTE — Consult Note (Signed)
Daily Rounding Note  01/17/2017, 8:31 AM  LOS: 3 days   SUBJECTIVE:   Chief complaint: NASH cirrhosis and ascites  Fatigue still limits activity.  + unsteady gait.  Hypotensive.  Urinates well, not being measured.  Coughed a lot yesterday, no signif sputum but as a result her abdomen muscles feel sore.  The cramps she had yesterday have improved.  1 stool last night.  Urinating well, no dark or bloody urine.   Eating well.       OBJECTIVE:         Vital signs in last 24 hours:    Temp:  [98.3 F (36.8 C)] 98.3 F (36.8 C) (11/16 0449) Pulse Rate:  [58-59] 59 (11/16 0449) Resp:  [17] 17 (11/16 0449) BP: (102-104)/(34) 104/34 (11/16 0449) SpO2:  [96 %] 96 % (11/16 0449) Last BM Date: 01/14/17 Filed Weights   01/14/17 1113  Weight: 61.2 kg (135 lb)   General: pleasant, alert   Heart: RRR Chest: clear bil.  Cough with deep inspiration.  No labored breathing  Abdomen: More protuberant but still soft.  Umbilical hernia.  Active BS.  NT  Extremities: 1 to 2+ pedal edema Neuro/Psych:  Oriented x 3.  Appropriate.  Fluid speech.  No asterixis.    Intake/Output from previous day: 11/15 0701 - 11/16 0700 In: 882 [P.O.:882] Out: -   Intake/Output this shift: No intake/output data recorded.  Lab Results: Recent Labs    01/14/17 1344 01/15/17 0542 01/16/17 0541  WBC 11.2* 6.7 9.5  HGB 13.0 10.4* 10.4*  HCT 36.3 29.5* 30.9*  PLT 86* 53* 57*   BMET Recent Labs    01/15/17 0542 01/16/17 0541 01/17/17 0334  NA 127* 132* 129*  K 3.9 4.0 4.1  CL 99* 101 95*  CO2 21* 25 25  GLUCOSE 166* 58* 146*  BUN 17 13 15   CREATININE 1.12* 1.22* 1.26*  CALCIUM 8.2* 8.0* 8.1*   LFT Recent Labs    01/15/17 0542 01/16/17 0541 01/17/17 0334  PROT 4.4* 4.5* 4.6*  ALBUMIN 2.2* 2.5* 2.5*  AST 86* 107* 107*  ALT 142* 117* 105*  ALKPHOS 280* 278* 271*  BILITOT 5.7* 6.3* 6.3*   PT/INR Recent Labs    01/14/17 1344    LABPROT 14.9  INR 1.18   Hepatitis Panel No results for input(s): HEPBSAG, HCVAB, HEPAIGM, HEPBIGM in the last 72 hours.  Studies/Results: US Abdomen Complete  Result Date: 01/15/2017 CLINICAL DATA:  Transaminitis, effusion post paracentesis. Recent TIPS 01/06/2017. EXAM: COMPLETE ABDOMINAL ULTRASOUND US HEPATIC DOPPLER ARTERIAL/VENOUS ULTRASOUND COMPARISON:  MR 12/17/2016 FINDINGS: Gallbladder: Echogenic calculi in the dependent aspect of the gallbladder near the neck, measured up to 0.8 cm diameter. No wall thickening. No sonographic Murphy sign per technologist. Common bile duct:  Normal in caliber, 3.84mm diameter. Liver: Nodular parenchymal contour and coarse echotexture. No focal lesion. Portal Vein Velocities Main:  41 cm/sec, hepatopetal Right: 24 cm/sec, hepatofugal Left:  6 cm/sec, hepatofugal TIPS stent velocities  (all antegrade): Proximal 119 cm/sec Mid 134 cm/sec Distal 100 cm/sec Hepatic Vein Velocities (all hepatofugal) Right:  24 cm/sec Middle:  26 cm/sec Left:  50  cm/sec Hepatic Artery Velocity: 118/19 cm/sec IVC:  Negative Pancreas: Visualized segments unremarkable, portions obscured by overlying bowel gas. Spleen:  No focal lesion, 13.1 x 5.7 x 12.6 cm (volume = 490 cm^3) Splenic Vein Velocity: 36 cm/sec Superior mesenteric vein velocity 33 cm/second Right Kidney:  No mass or hydronephrosis, 10.4cm in length.  Left Kidney:  No lesion or hydronephrosis, 10cm in length. Abdominal aorta: Visualized segments unremarkable, portions obscured by overlying bowel gas. Varices: None identified Ascites: Present Bilateral pleural effusions noted. IMPRESSION: 1. Patent TIPS stent with reversal of flow within the right and left branches of the portal vein. 2. Nodular liver, splenomegaly, and ascites consistent with cirrhosis and portal venous hypertension. 3. Cholelithiasis without other ultrasound evidence of cholecystitis or biliary obstruction. Electronically Signed   By: Lucrezia Europe M.D.   On:  01/15/2017 17:03   US Paracentesis  Result Date: 01/15/2017 INDICATION: History of NASH cirrhosis with recurrent abdominal ascites. The patient is status post TIPS on January 06, 2017. Request has been made for paracentesis. EXAM: ULTRASOUND GUIDED THERAPEUTIC PARACENTESIS MEDICATIONS: 1% xylocaine COMPLICATIONS: None immediate. PROCEDURE: Informed written consent was obtained from the patient after a discussion of the risks, benefits and alternatives to treatment. A timeout was performed prior to the initiation of the procedure. Initial ultrasound scanning demonstrates a moderate amount of ascites within the right lower abdominal quadrant. The right lower abdomen was prepped and draped in the usual sterile fashion. 1% xylocaine was used for local anesthesia. Following this, a 19 gauge, 7-cm, Yueh catheter was introduced. An ultrasound image was saved for documentation purposes. The paracentesis was performed. The catheter was removed and a dressing was applied. The patient tolerated the procedure well without immediate post procedural complication. FINDINGS: A total of approximately 4.4 L of icteric fluid was removed. IMPRESSION: Successful ultrasound-guided paracentesis yielding 4.4 liters of peritoneal fluid. Read by: Saverio Danker, PA-C Electronically Signed   By: Jerilynn Mages.  Shick M.D.   On: 01/15/2017 15:05   US Liver Doppler  Result Date: 01/15/2017 CLINICAL DATA:  Transaminitis, effusion post paracentesis. Recent TIPS 01/06/2017. EXAM: COMPLETE ABDOMINAL ULTRASOUND US HEPATIC DOPPLER ARTERIAL/VENOUS ULTRASOUND COMPARISON:  MR 12/17/2016 FINDINGS: Gallbladder: Echogenic calculi in the dependent aspect of the gallbladder near the neck, measured up to 0.8 cm diameter. No wall thickening. No sonographic Murphy sign per technologist. Common bile duct:  Normal in caliber, 3.23mm diameter. Liver: Nodular parenchymal contour and coarse echotexture. No focal lesion. Portal Vein Velocities Main:  41 cm/sec,  hepatopetal Right: 24 cm/sec, hepatofugal Left:  6 cm/sec, hepatofugal TIPS stent velocities  (all antegrade): Proximal 119 cm/sec Mid 134 cm/sec Distal 100 cm/sec Hepatic Vein Velocities (all hepatofugal) Right:  24 cm/sec Middle:  26 cm/sec Left:  50  cm/sec Hepatic Artery Velocity: 118/19 cm/sec IVC:  Negative Pancreas: Visualized segments unremarkable, portions obscured by overlying bowel gas. Spleen:  No focal lesion, 13.1 x 5.7 x 12.6 cm (volume = 490 cm^3) Splenic Vein Velocity: 36 cm/sec Superior mesenteric vein velocity 33 cm/second Right Kidney:  No mass or hydronephrosis, 10.4cm in length. Left Kidney:  No lesion or hydronephrosis, 10cm in length. Abdominal aorta: Visualized segments unremarkable, portions obscured by overlying bowel gas. Varices: None identified Ascites: Present Bilateral pleural effusions noted. IMPRESSION: 1. Patent TIPS stent with reversal of flow within the right and left branches of the portal vein. 2. Nodular liver, splenomegaly, and ascites consistent with cirrhosis and portal venous hypertension. 3. Cholelithiasis without other ultrasound evidence of cholecystitis or biliary obstruction. Electronically Signed   By: Lucrezia Europe M.D.   On: 01/15/2017 17:03   Scheduled Meds: . diltiazem  120 mg Oral Daily  . furosemide  20 mg Oral Daily  . insulin aspart  0-9 Units Subcutaneous TID WC  . lactulose  20 g Oral Daily  . sodium chloride flush  3 mL Intravenous Q12H  .  spironolactone  50 mg Oral Daily   Continuous Infusions: . sodium chloride     PRN Meds:.sodium chloride, albuterol, budesonide, ondansetron **OR** ondansetron (ZOFRAN) IV, sodium chloride flush, traMADol  ASSESMENT:   *  NASH  Cirrhosis.  Decompensated.  Fluctuating LFTs but no acute rising trend as inpt.    *  01/06/17  TIPS for difficult to manage ascites.  Doppler 11/14: patent TIPS but retrograde/hepatofugal PV flow.  4.4 liter tap 11/14.    Diuretics adjusted to Lasix 20, Aldactone 50 daily as of  this AM.    *  AKI/type II HRS.  Though urine not measured, not oliguric.    *  Thrombocytopenia.    *  Normocytic anemia   Upper abdominal pain - improved  PLAN   *  Daily CMET.  CBC in AM.    *  Change Lactulose from Q day to BID.    *  Stay on current doses of diuretics.     Azucena Freed  01/17/2017, 8:31 AM Pager: [phone]  I have discussed the case with the PA, and that is the plan I formulated. I personally interviewed and examined the patient.  She is doing reasonably well with stable renal function and volume of ascites. Caution warranted with diuretic dosing given renal sensitivity and hyponatremia.  Exam unchanged today other than decreased upper tenderness.   No signs encephalopathy.  Monitor here another day to ensure stable ascites control and renal function.  If so, home tomorrow for close follow up with our office and check of BMP early next week.  2 gram sodium restriction and 2 liter daily fluid restriction.  Dr. Henrene Pastor on duty over weekend to follow patient.  Nelida Meuse III Pager [phone]  Mon-Fri 8a-5p [phone] after 5p, weekends, holidays

## 2017-01-17 NOTE — Care Management Note (Signed)
Case Management Note  Patient Details  Name: Cynthia Long MRN: [id number] Date of Birth: August 18, 1940  Subjective/Objective:                    Action/Plan:  [name] with [facility] called to decline referral, Patient aware and would like [facility] . Spoke to [place] at [facility] they have been brought by [employer] as of Nov 30, therefore she is unable to take referral.   Patient aware. Mechele Claude at [facility] accepted referral start of care date is Tuesday Nov 20. [place] phone number [phone] fax [phone], referral faxed discharge summary needs to be faxed at time of discharge. Expected Discharge Date:                  Expected Discharge Plan:  [place]  In-House Referral:  Clinical Social Work  Discharge planning Services  CM Consult  Post Acute Care Choice:    Choice offered to:  Patient  DME Arranged:    DME Agency:     HH Arranged:    [place] Agency:     Status of Service:  In process, will continue to follow  If discussed at Long Length of Stay Meetings, dates discussed:    Additional Comments:  Marilu Favre, RN 01/17/2017, 4:16 PM

## 2017-01-17 NOTE — Care Management Note (Addendum)
Case Management Note  Patient Details  Name: Cynthia Long MRN: [id number] Date of Birth: September 24, 1940  Subjective/Objective:                    Action/Plan:  Patient's daughter [name] returned phone call. [name] aware her mother wants to go home with home health at discharge. [name] is with her mother 10 to 12 hours a day. [name] aware of PT recommendations . [name] wants to honor her mother's wishes and take her home. She will discuss SNF again with her mother. NCM advised [name] MD is thinking her mother is going to be discharged over weekend and if patient and daughter decide on SNF , social work will have to know ASAP. [name] voiced understanding.   Once MD signs home health orders and face to face NCM will arrange home health   Per patient's request arranged home health through [facility] [phone] fax [phone] , spoke with [name].   Ordered walker and 3 in 1 through [facility].  Expected Discharge Date:                  Expected Discharge Plan:     In-House Referral:  Clinical Social Work  Discharge planning Services  CM Consult  Post Acute Care Choice:    Choice offered to:  Patient  DME Arranged:    DME Agency:     HH Arranged:    [place] Agency:     Status of Service:  In process, will continue to follow  If discussed at Long Length of Stay Meetings, dates discussed:    Additional Comments:  Marilu Favre, RN 01/17/2017, 11:15 AM

## 2017-01-17 NOTE — Progress Notes (Signed)
Physical Therapy Treatment Patient Details Name: Cynthia Long MRN: [id number] DOB: [birth year] Today's Date: 01/17/2017    History of Present Illness Pt is a 76 y/o female admitted secondary to nausea and increased abdominal swelling. Pt recently underwent IR TIPS procedure and IR paracentesis on 11/5. Imaging revealed bilat pleural effusions and increased fluid in abdomen. Pt had R lower abdominal quadrant US paracentesis on 11/14. PMH includes GI bleed, HTN, DM, cirrhosis from NASH, and IR paracentesis and IR TIPS procedure.     PT Comments    Pt progressing towards goals, however, continues to be limited secondary to fatigue. Practiced stair navigation this session and pt requiring min to min guard assist. Fatigued after stair navigation, so requesting to return to room. Educated about use of RW as pt continues to require min A for steadying without use of AD. Pt reports she would prefer to go home at d/c, however, still wants to discuss disposition with daughter. Recommending short term SNF at d/c, however, if pt declines will need HHPT. Will continue to follow acutely to maximize functional mobility independence and safety.    Follow Up Recommendations  SNF;Supervision/Assistance - 24 hour     Equipment Recommendations  Rolling walker with 5" wheels;3in1 (PT)    Recommendations for Other Services       Precautions / Restrictions Precautions Precautions: Fall Restrictions Weight Bearing Restrictions: No    Mobility  Bed Mobility Overal bed mobility: Needs Assistance Bed Mobility: Supine to Sit     Supine to sit: HOB elevated;Min guard     General bed mobility comments: In chair upon entry   Transfers Overall transfer level: Needs assistance Equipment used: 1 person hand held assist Transfers: Sit to/from Stand Sit to Stand: Min guard         General transfer comment: Min guard for safety  Ambulation/Gait Ambulation/Gait assistance: Min guard;Min  assist Ambulation Distance (Feet): 125 Feet Assistive device: Rolling walker (2 wheeled);1 person hand held assist Gait Pattern/deviations: Step-through pattern;Decreased stride length;Drifts right/left Gait velocity: Decreased Gait velocity interpretation: Below normal speed for age/gender General Gait Details: Slow, cautious gait. Continued to be unsteady with no DME, so educated about use of RW. Increased steadiness with use of RW and required min guard for safety. Distance continues to be limited by fatigue.    Stairs Stairs: Yes   Stair Management: One rail Right;Step to pattern;Alternating pattern;Forwards Number of Stairs: 8 General stair comments: Min A to min guard for steadying assist. Pt winded after completing stair navigation secondary to fatigue. Educated about need for assist with stair management. Educated about step to technique and use of BUE on R rail to increase safety.   Wheelchair Mobility    Modified Rankin (Stroke Patients Only)       Balance Overall balance assessment: Needs assistance Sitting-balance support: No upper extremity supported;Feet supported Sitting balance-Leahy Scale: Fair     Standing balance support: Bilateral upper extremity supported;During functional activity Standing balance-Leahy Scale: Poor Standing balance comment: Reliant on UE support                             Cognition Arousal/Alertness: Awake/alert Behavior During Therapy: WFL for tasks assessed/performed Overall Cognitive Status: Within Functional Limits for tasks assessed  Exercises      General Comments General comments (skin integrity, edema, etc.): Pt would like to go home at d/c, despite education about short term SNF. No family present to discuss disposition. Discussed energy conservation at home and need for assist for 24/7 initially upon return home. Verbally reviewed HEP, as pt was fatigued  after mobility this session.       Pertinent Vitals/Pain Pain Assessment: 0-10 Pain Score: 4  Faces Pain Scale: Hurts little more Pain Location: abdomen secondary to coughing spell  Pain Descriptors / Indicators: Sore Pain Intervention(s): Limited activity within patient's tolerance;Monitored during session;Repositioned    Home Living Family/patient expects to be discharged to:: Private residence Living Arrangements: Children Available Help at Discharge: Family;Available PRN/intermittently Type of Home: House Home Access: Stairs to enter Entrance Stairs-Rails: Right Home Layout: Two level;Laundry or work area in [place]: Civil engineer, contracting - built in Additional Comments: Daughter available during the day     Prior Function Level of Independence: Needs assistance  Gait / Transfers Assistance Needed: Per family pt required assist with walking throughout home ADL's / Homemaking Assistance Needed: Required assist from daughter for bathing and dressing.  Comments: Was very independent until about September. Working 5 days/week up until september.    PT Goals (current goals can now be found in the care plan section) Acute Rehab PT Goals Patient Stated Goal: to go home  PT Goal Formulation: With patient Time For Goal Achievement: 01/22/17 Potential to Achieve Goals: Good Progress towards PT goals: Progressing toward goals    Frequency    Min 3X/week(in case pt goes home )      PT Plan Current plan remains appropriate    Co-evaluation              AM-PAC PT "6 Clicks" Daily Activity  Outcome Measure  Difficulty turning over in bed (including adjusting bedclothes, sheets and blankets)?: A Little Difficulty moving from lying on back to sitting on the side of the bed? : Unable Difficulty sitting down on and standing up from a chair with arms (e.g., wheelchair, bedside commode, etc,.)?: Unable Help needed moving to and from a bed to chair (including a  wheelchair)?: A Little Help needed walking in hospital room?: A Little Help needed climbing 3-5 steps with a railing? : A Little 6 Click Score: 14    End of Session Equipment Utilized During Treatment: Gait belt Activity Tolerance: Patient limited by fatigue Patient left: in chair;with call bell/phone within reach;Other (comment)(with MD in room ) Nurse Communication: Mobility status PT Visit Diagnosis: Unsteadiness on feet (R26.81);Muscle weakness (generalized) (M62.81);Pain Pain - part of body: (abdomen )     Time: 1194-1740 PT Time Calculation (min) (ACUTE ONLY): 18 min  Charges:  $Gait Training: 8-22 mins                    G Codes:       Leighton Ruff, PT, DPT  Acute Rehabilitation Services  Pager: [phone]    Rudean Hitt 01/17/2017, 12:17 PM

## 2017-01-18 ENCOUNTER — Other Ambulatory Visit: Payer: Self-pay | Admitting: Physician Assistant

## 2017-01-18 DIAGNOSIS — E871 Hypo-osmolality and hyponatremia: Secondary | ICD-10-CM

## 2017-01-18 DIAGNOSIS — R410 Disorientation, unspecified: Secondary | ICD-10-CM

## 2017-01-18 DIAGNOSIS — R188 Other ascites: Secondary | ICD-10-CM

## 2017-01-18 DIAGNOSIS — I10 Essential (primary) hypertension: Secondary | ICD-10-CM

## 2017-01-18 LAB — CBC
HCT: 29.8 % — ABNORMAL LOW (ref 36.0–46.0)
HEMOGLOBIN: 10.1 g/dL — AB (ref 12.0–15.0)
MCH: 30.6 pg (ref 26.0–34.0)
MCHC: 33.9 g/dL (ref 30.0–36.0)
MCV: 90.3 fL (ref 78.0–100.0)
Platelets: 47 10*3/uL — ABNORMAL LOW (ref 150–400)
RBC: 3.3 MIL/uL — AB (ref 3.87–5.11)
RDW: 20.2 % — ABNORMAL HIGH (ref 11.5–15.5)
WBC: 8.5 10*3/uL (ref 4.0–10.5)

## 2017-01-18 LAB — COMPREHENSIVE METABOLIC PANEL
ALK PHOS: 251 U/L — AB (ref 38–126)
ALT: 89 U/L — ABNORMAL HIGH (ref 14–54)
ANION GAP: 9 (ref 5–15)
AST: 86 U/L — ABNORMAL HIGH (ref 15–41)
Albumin: 2.3 g/dL — ABNORMAL LOW (ref 3.5–5.0)
BILIRUBIN TOTAL: 6.3 mg/dL — AB (ref 0.3–1.2)
BUN: 14 mg/dL (ref 6–20)
CALCIUM: 7.9 mg/dL — AB (ref 8.9–10.3)
CO2: 24 mmol/L (ref 22–32)
Chloride: 94 mmol/L — ABNORMAL LOW (ref 101–111)
Creatinine, Ser: 1.04 mg/dL — ABNORMAL HIGH (ref 0.44–1.00)
GFR calc non Af Amer: 52 mL/min — ABNORMAL LOW (ref >60)
Glucose, Bld: 133 mg/dL — ABNORMAL HIGH (ref 65–99)
POTASSIUM: 3.7 mmol/L (ref 3.5–5.1)
Sodium: 127 mmol/L — ABNORMAL LOW (ref 135–145)
TOTAL PROTEIN: 4.4 g/dL — AB (ref 6.5–8.1)

## 2017-01-18 LAB — GLUCOSE, CAPILLARY: GLUCOSE-CAPILLARY: 143 mg/dL — AB (ref 65–99)

## 2017-01-18 MED ORDER — FUROSEMIDE 40 MG PO TABS: 20.0000 mg | ORAL_TABLET | Freq: Every day | ORAL | Status: DC

## 2017-01-18 NOTE — Discharge Instructions (Signed)
[facility]  Phone [phone] Ina,  You have acute liver failure. Your labs have improved slightly. You do have some associated hyponatremia (low sodium) and thrombocytopenia (low platelets). Please have your labs rechecked in 2-3 days by your PCP. If you notice spontaneous bleeding, please seek medical attention as this can be caused by your low platelets. Your low platelets are secondary to your liver disease. I am discontinuing your diabetes medications. Your sodium level will be difficult to control because of your liver disease and fluid retention. Sodium levels between 127 and 135 are find for now. If you have confusion or start feeling ill, this may need to be more aggressively corrected.

## 2017-01-18 NOTE — Progress Notes (Signed)
Daily Rounding Note  01/18/2017, 9:44 AM  LOS: 4 days   SUBJECTIVE:      Abdomen remains soft and the distention/protuberance is about the same.  No belly pain.  Eating well.  Had 2 bowel movements yesterday, one this morning.  Even though she is having a bowel movement she says she still feels a little bit constipated and like it is hard to have a bowel movement.  OBJECTIVE:         Vital signs in last 24 hours:    Temp:  [98.2 F (36.8 C)-98.5 F (36.9 C)] 98.5 F (36.9 C) (11/17 0449) Pulse Rate:  [50-58] 53 (11/17 0449) Resp:  [16] 16 (11/17 0449) BP: (101-111)/(31-34) 108/31 (11/17 0449) SpO2:  [96 %-98 %] 97 % (11/17 0449) Last BM Date: 01/17/17 Filed Weights   01/14/17 1113  Weight: 61.2 kg (135 lb)   General: Chronically ill-appearing.  Mild jaundice Heart: RRR.  No MRG. Chest: No labored breathing or cough.  Reduced breath sounds overall but clear bilaterally. Abdomen: Mildly to moderately protuberant, soft.  Not tender.  Active bowel sounds. Extremities: 1+ pedal edema. Neuro/Psych: No tremor, no asterixis.  Alert.  Clear speech though speaks somewhat deliberately and slowly.  Oriented x3.  Moving all 4 limbs, strength not tested.  Intake/Output from previous day: 11/16 0701 - 11/17 0700 In: 780 [P.O.:780] Out: 400 [Urine:400]  Intake/Output this shift: No intake/output data recorded.  Lab Results: Recent Labs    01/16/17 0541 01/18/17 0429  WBC 9.5 8.5  HGB 10.4* 10.1*  HCT 30.9* 29.8*  PLT 57* 47*   BMET Recent Labs    01/16/17 0541 01/17/17 0334 01/18/17 0429  NA 132* 129* 127*  K 4.0 4.1 3.7  CL 101 95* 94*  CO2 25 25 24   GLUCOSE 58* 146* 133*  BUN 13 15 14   CREATININE 1.22* 1.26* 1.04*  CALCIUM 8.0* 8.1* 7.9*   LFT Recent Labs    01/16/17 0541 01/17/17 0334 01/18/17 0429  PROT 4.5* 4.6* 4.4*  ALBUMIN 2.5* 2.5* 2.3*  AST 107* 107* 86*  ALT 117* 105* 89*  ALKPHOS 278*  271* 251*  BILITOT 6.3* 6.3* 6.3*   PT/INR No results for input(s): LABPROT, INR in the last 72 hours. Hepatitis Panel No results for input(s): HEPBSAG, HCVAB, HEPAIGM, HEPBIGM in the last 72 hours.  Studies/Results: No results found.   Scheduled Meds: . diltiazem  120 mg Oral Daily  . furosemide  20 mg Oral Daily  . insulin aspart  0-9 Units Subcutaneous TID WC  . lactulose  20 g Oral BID  . sodium chloride flush  3 mL Intravenous Q12H  . spironolactone  50 mg Oral Daily   Continuous Infusions: . sodium chloride     PRN Meds:.sodium chloride, albuterol, budesonide, ondansetron **OR** ondansetron (ZOFRAN) IV, sodium chloride flush, traMADol   ASSESMENT:   *  NASH  Cirrhosis.  Decompensated.  Fluctuating LFTs but no acute rising trend as inpt.    *  01/06/17  TIPS for difficult to manage ascites.  Doppler 11/14: patent TIPS but retrograde/hepatofugal PV flow.  4.4 liter tap 11/14.    Diuretics adjusted to Lasix 20, Aldactone 50 daily as of this AM.   Prophylactic Lactulose in place.  Ammonia level normal.    *  AKI/type II HRS.  Though urine not measured, not oliguric.   BUN/Creatinine/GFR improved last 24 hours.  On lowered dose of Lasix and stable dose Aldactone    *  Thrombocytopenia.    *  Normocytic anemia   *  Hyponatremia.  Non-critical.  On 2gm Na diet and 2 liter fluid restrictions    PLAN   *  Patient is safe for discharge today.  Reminded her of the sodium and fluid restrictions regarding her diet intake  *  Continue Lasix 20, aldactone 50 mg daily.  Continue 2 gm Na diet with 2 liter fluid restrictions.   Daily weights at home.  Continue 20 gm Lactulose BID, titrate up/down to 2-3 BMs daily.  *  GI will arrange labs: cmet, CBC in next 7 to 10 days at [facility] which is much closer to her home than low-power lab in [place]..  Office visit with APP or Dr Carlean Purl in next 2 weeks (note sent to him).    Patient aware that GI will be contacting  her at home in the coming week.  Cynthia Long  01/18/2017, 9:44 AM Pager: [phone]  GI ATTENDING  Interval history data reviewed. Patient seen and examined. Overall she feels reasonably well. Anxious to go down. Agree with interval progress note. I discussed with her the importance of fluid restriction (approximately 1500-2,000 mL) with low serum sodium. Plan is discharge home on low-dose diuretic with outpatient blood work and GI follow-up to be arranged.  Docia Chuck. Geri Seminole., M.D. [facility] Division of Gastroenterology

## 2017-01-18 NOTE — Discharge Summary (Signed)
Physician Discharge Summary  Cynthia Long MCR:[id number] DOB: [birth year] DOA: 01/14/2017  PCP: Macon Large, MD  Admit date: 01/14/2017 Discharge date: 01/18/2017  Admitted From: Home Disposition: Home  Recommendations for Outpatient Follow-up:  1. Follow up with PCP in 1 week 2. Follow up with Gastroenterology 3. Follow-up with Vascular/Interventional radiology 4. Please obtain BMP/CBC in one week 5. Please follow up on the following pending results: None  Home Health: PT, OT Equipment/Devices: 3 in 1, walking roller  Discharge Condition: Stable CODE STATUS: DNR Diet recommendation: Renal diet, fluid restriction 1.5-2L, 2 gram sodium limit  Brief/Interim Summary:  Admission HPI written by Waldemar Dickens, MD    HPI:   Cynthia Long  is a 76 y.o. female, with history of Nonalcoholic steatohepatitis, diabetes mellitus, gastritis, esophageal varices came to hospital with complaints of fatigue, anorexia.  Patient recently had a TIPS procedure done on Monday, 01/06/2017.  Today came to hospital for further evaluation. In the ED lab work revealed elevated liver enzymes AST 118, ALT 213, alk phos 349, total bilirubin 7.2, ammonia was normal at 32. GI was consulted at [facility].  Dr. Carlean Purl recommends patient to be transferred to [facility] For evaluation of TIPS stent.  She denies diarrhea.,  No vomiting. No chest pain or shortness of breath. Denies fever or dysuria.    Hospital course:  Acute hepatic failure Nonalcoholic steatohepatitis Patient with a recent TIPS procedure. Followed outpatient by gastroenterologists in [place] but is wanting to transition care to [facility] (has seen them in the past). AST and bilirubin increased slightly. ALT decreased and alk phos stable. Low dose Lasix and spironolactone. Continued lactulose. Patient declining palliative care consult.  Ascites Secondary to hepatic failure. S/p paracentesis with 4.4L removed. No  abdominal pain or confusion to suspect SBP.  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 No evidence of bleeding. Stable. Repeat CBC as an outpatient.  Acute kidney injury Secondary to fluid overload in setting of acute hepatic failure. Improved with transition to low dose lasix  Hyponatremia Hypervolemic hyponatremia secondary to cirrhosis and acute hepatic failure. Will likely remain hyponatremia. Keep sodium greater than 125. Recheck BMP as an outpatient.  Essential hypertension Continued diltiazem  Diabetes mellitus, type 2 Patient on glimepiride and Onglyza as an outpatient. Labile blood sugar secondary to hepatic failure. Hemoglobin A1C of 5.4%. Discontinued glimepiride and Onglyza on discharge.  Abdominal cramping Secondary to recent paracentesis. Resolved.    Discharge Diagnoses:  Active Problems:   HTN (hypertension)   Hepatic cirrhosis (HCC)   Ascites   Uncontrolled type 2 diabetes mellitus , without long-term current use of insulin [facility])    Discharge Instructions  Discharge Instructions    Diet - low sodium heart healthy   Complete by:  As directed    2 Gram limit. Fluid restriction of 2 litres   Discharge instructions   Complete by:  As directed    Please bring the results of any new lab work to your appointment with Dr. Laurence Ferrari in December at Interventional Radiology clinic.   Increase activity slowly   Complete by:  As directed      Allergies as of 01/18/2017      Reactions   Biaxin [clarithromycin]    Liver enzymes elevated per pt   Sulfa Antibiotics Diarrhea      Medication List    STOP taking these medications   glimepiride 4 MG tablet Commonly known as:  AMARYL   ONGLYZA 5 MG Tabs tablet Generic drug:  saxagliptin  HCl     TAKE these medications   acetaminophen 325 MG tablet Commonly known as:  TYLENOL Take 325 mg by mouth every 6 (six) hours as needed for mild pain or headache.   budesonide 0.5 MG/2ML nebulizer  solution Commonly known as:  PULMICORT Take 0.5 mg by nebulization daily as needed (for shortness of breath).   CALCIUM LACTATE PO Take 6 tablets by mouth every evening.   diltiazem 120 MG 24 hr capsule Commonly known as:  DILACOR XR Take 120 mg by mouth daily.   diltiazem 120 MG 24 hr capsule Commonly known as:  CARDIZEM CD Take 1 capsule daily by mouth.   furosemide 40 MG tablet Commonly known as:  LASIX Take 0.5 tablets (20 mg total) daily by mouth. What changed:  how much to take   lactulose 10 GM/15ML solution Commonly known as:  CHRONULAC Take 30-40 mLs daily by mouth.   ondansetron 4 MG disintegrating tablet Commonly known as:  ZOFRAN ODT Take 1 tablet (4 mg total) every 6 (six) hours as needed by mouth for nausea or vomiting.   onetouch ultrasoft lancets 1 each by Other route 2 (two) times daily.   PRESCRIPTION MEDICATION every 7 (seven) days. Allergy Shots   spironolactone 50 MG tablet Commonly known as:  ALDACTONE Take 50 mg by mouth daily.   VENTOLIN HFA 108 (90 Base) MCG/ACT inhaler Generic drug:  albuterol Inhale 2 puffs into the lungs every 6 (six) hours as needed for wheezing or shortness of breath.      [place] Follow up.   Why:  RW will be delivered to your room prior to discharge Contact information: [place] [address] [phone]        [facility] Follow up.   Why:  Start of care will be January 21, 2017 Contact information: phone: [phone] Fax: [phone]       Macon Large, MD. Schedule an appointment as soon as possible for a visit in 2 day(s).   Specialty:  Internal Medicine Why:  Lab work Sport and exercise psychologist information: [address] [place] [address]        Gatha Mayer, MD Follow up in 2 week(s).   Specialty:  Gastroenterology Contact information: [address] [place] [address] [phone]          Allergies   Allergen Reactions  . Biaxin [Clarithromycin]     Liver enzymes elevated per pt  . Sulfa Antibiotics Diarrhea    Consultations:  Gastroenterology   Procedures/Studies: Dg Chest 2 View  Result Date: 01/14/2017 CLINICAL DATA:  Nausea and vomiting, history of recent TIPS EXAM: CHEST  2 VIEW COMPARISON:  08/14/2013, 12/17/2016 FINDINGS: Cardiac shadow is mildly prominent. Left pleural effusion with underlying atelectasis/infiltrate is noted. This is new from the most recent exam of 12/17/2016. Mild right basilar atelectasis is noted as well. A small right pleural effusion is also seen. Changes consistent with recent TIPS are noted. IMPRESSION: Bilateral pleural effusions with atelectatic changes left significantly greater than right. Electronically Signed   By: Inez Catalina M.D.   On: 01/14/2017 14:37   US Abdomen Complete  Result Date: 01/15/2017 CLINICAL DATA:  Transaminitis, effusion post paracentesis. Recent TIPS 01/06/2017. EXAM: COMPLETE ABDOMINAL ULTRASOUND US HEPATIC DOPPLER ARTERIAL/VENOUS ULTRASOUND COMPARISON:  MR 12/17/2016 FINDINGS: Gallbladder: Echogenic calculi in the dependent aspect of the gallbladder near the neck, measured up to 0.8 cm diameter. No wall thickening.  No sonographic Murphy sign per technologist. Common bile duct:  Normal in caliber, 3.8m diameter. Liver: Nodular parenchymal contour and coarse echotexture. No focal lesion. Portal Vein Velocities Main:  41 cm/sec, hepatopetal Right: 24 cm/sec, hepatofugal Left:  6 cm/sec, hepatofugal TIPS stent velocities  (all antegrade): Proximal 119 cm/sec Mid 134 cm/sec Distal 100 cm/sec Hepatic Vein Velocities (all hepatofugal) Right:  24 cm/sec Middle:  26 cm/sec Left:  50  cm/sec Hepatic Artery Velocity: 118/19 cm/sec IVC:  Negative Pancreas: Visualized segments unremarkable, portions obscured by overlying bowel gas. Spleen:  No focal lesion, 13.1 x 5.7 x 12.6 cm (volume = 490 cm^3) Splenic Vein Velocity: 36 cm/sec Superior  mesenteric vein velocity 33 cm/second Right Kidney:  No mass or hydronephrosis, 10.4cm in length. Left Kidney:  No lesion or hydronephrosis, 10cm in length. Abdominal aorta: Visualized segments unremarkable, portions obscured by overlying bowel gas. Varices: None identified Ascites: Present Bilateral pleural effusions noted. IMPRESSION: 1. Patent TIPS stent with reversal of flow within the right and left branches of the portal vein. 2. Nodular liver, splenomegaly, and ascites consistent with cirrhosis and portal venous hypertension. 3. Cholelithiasis without other ultrasound evidence of cholecystitis or biliary obstruction. Electronically Signed   By: DLucrezia EuropeM.D.   On: 01/15/2017 17:03   [place]acentesis  Result Date: 01/15/2017 INDICATION: History of NASH cirrhosis with recurrent abdominal ascites. The patient is status post TIPS on January 06, 2017. Request has been made for paracentesis. EXAM: ULTRASOUND GUIDED THERAPEUTIC PARACENTESIS MEDICATIONS: 1% xylocaine COMPLICATIONS: None immediate. PROCEDURE: Informed written consent was obtained from the patient after a discussion of the risks, benefits and alternatives to treatment. A timeout was performed prior to the initiation of the procedure. Initial ultrasound scanning demonstrates a moderate amount of ascites within the right lower abdominal quadrant. The right lower abdomen was prepped and draped in the usual sterile fashion. 1% xylocaine was used for local anesthesia. Following this, a 19 gauge, 7-cm, Yueh catheter was introduced. An ultrasound image was saved for documentation purposes. The paracentesis was performed. The catheter was removed and a dressing was applied. The patient tolerated the procedure well without immediate post procedural complication. FINDINGS: A total of approximately 4.4 L of icteric fluid was removed. IMPRESSION: Successful ultrasound-guided paracentesis yielding 4.4 liters of peritoneal fluid. Read by: KSaverio Danker PA-C  Electronically Signed   By: MJerilynn Mages  Shick M.D.   On: 01/15/2017 15:05   Ir Tips  Result Date: 01/06/2017 CLINICAL DATA:  76year old female with NASH cirrhosis complicated by medically refractory large volume symptomatic ascites. She presents for paracentesis and elective creation of a TIPS. EXAM: 1. Ultrasound-guided paracentesis 2. Creation of a transjugular intrahepatic portosystemic shunt MEDICATIONS: As antibiotic prophylaxis, 3.375 g Zosyn was ordered pre-procedure and administered intravenously within one hour of incision. One unit of platelets was also administered IV during the procedure. ANESTHESIA/SEDATION: General - as administered by the Anesthesia department CONTRAST:  75 mL Isovue 370 FLUOROSCOPY TIME:  Fluoroscopy Time: 14 minutes 0 seconds (135 mGy). COMPLICATIONS: None immediate. PROCEDURE: Informed written consent was obtained from the patient after a thorough discussion of the procedural risks, benefits and alternatives. All questions were addressed. Maximal Sterile Barrier Technique was utilized including caps, mask, sterile gowns, sterile gloves, sterile drape, hand hygiene and skin antiseptic. A timeout was performed prior to the initiation of the procedure. Ultrasound was used to interrogate the right lower quadrant. There is large volume ascites. A suitable skin entry site was selected. A small dermatotomy was made. Under real-time sonographic guidance,  a Safe-T-Centesis catheter was advanced into the peritoneal space. The catheter was connected to fact a nurse suction and paracentesis was performed removing a total volume of 7200 mL amber colored ascites. Attention was next turned to the right upper quadrant. Using ultrasound, the liver was evaluated. A small peripheral portal venous branch was identified. Under real-time sonographic guidance, the peripheral portal venous branch was punctured with a 22 gauge Chiba needle. A gentle hand injection of contrast material confirmed location  within the portal vein. The 0.018 wire was advanced into the main portal vein. The Chiba needle was then exchanged for the Accustick sheath was which was advanced into the main portal vein. A portal venogram was performed. The right internal jugular vein was interrogated with ultrasound and found to be widely patent. An image was obtained and stored for the medical record. Local anesthesia was attained by infiltration with 1% lidocaine. A small dermatotomy was made. Under real-time sonographic guidance, the vessel was punctured with a 21 gauge micropuncture needle. Using standard technique, the initial micro needle was exchanged over a 0.018 micro wire for a transitional 4 [place] micro sheath. The micro sheath was then exchanged over a 0.035 wire for a 10 [place] dilator. The tract was dilated. A 10 French TIPS sheath was then advanced over the wire and positioned in the right atrium. An MPA catheter and Bentson wire were used to select the right hepatic vein. A right hepatic venogram was performed. Using the coal open toe needle, the central aspect of the right portal vein was then successfully punctured. A glidewire was advanced in the main portal vein. A glide catheter was advanced over the wire. The glidewire was exchanged for a marker pigtail catheter. The main portal venous pressure was then measured. Pre TIPS main portal venous pressure:  23 mm Hg Pre TIPS right hepatic venous pressure:  10 mm Hg Pre TIPS portosystemic gradient: 13 mm Hg The Amplatz wire was reintroduced in the marker pigtail catheter was removed. The transhepatic tract was then dilated to 6 mm using a 6 x 40 mm Mustang balloon. A 6 (covered) by 10 mm Viatorr stent was then selected in advanced. The stent was deployed in the standard fashion and post dilated to 8 mm using an 8 x 40 mm Mustang balloon. The marker pigtail catheter was reintroduced. The portal pressures were again measured. Post TIPS main portal venous pressure:  20 mm Hg Post  TIPS right hepatic venous pressure:  17 mm Hg Post TIPS portosystemic gradient: 3 mm Hg A final portal venogram was obtained. The TIPS is well positioned in widely patent. There is antegrade flow in the both the left and right main portal veins. The pigtail catheter was un formed and removed over a wire. The 10 French jugular sheath was removed and hemostasis was attained by manual pressure. The Safe-T-Centesis catheter was removed and a sterile bandage applied. The Accustick sheath was pulled back into the hepatic parenchyma and the tract was embolized with a Gel-Foam slurry as the sheath was removed. A sterile bandage was applied. The patient tolerated the procedure well. IMPRESSION: 1. Paracentesis removing 7.2 L ascites. 2. Successful TIPS creation using a 6 x 10 Viatorr stent from the right hepatic to the right portal vein. Patient will be admitted to the hospitalist service for overnight observation. Outpatient follow-up with a TIPS ultrasound in 1 month. Signed, Criselda Peaches, MD [facility] Radiology Electronically Signed   By: Dellis Filbert.D.  On: 01/06/2017 17:17   US Liver Doppler  Result Date: 01/15/2017 CLINICAL DATA:  Transaminitis, effusion post paracentesis. Recent TIPS 01/06/2017. EXAM: COMPLETE ABDOMINAL ULTRASOUND US HEPATIC DOPPLER ARTERIAL/VENOUS ULTRASOUND COMPARISON:  MR 12/17/2016 FINDINGS: Gallbladder: Echogenic calculi in the dependent aspect of the gallbladder near the neck, measured up to 0.8 cm diameter. No wall thickening. No sonographic Murphy sign per technologist. Common bile duct:  Normal in caliber, 3.35m diameter. Liver: Nodular parenchymal contour and coarse echotexture. No focal lesion. Portal Vein Velocities Main:  41 cm/sec, hepatopetal Right: 24 cm/sec, hepatofugal Left:  6 cm/sec, hepatofugal TIPS stent velocities  (all antegrade): Proximal 119 cm/sec Mid 134 cm/sec Distal 100 cm/sec Hepatic Vein Velocities  (all hepatofugal) Right:  24 cm/sec Middle:  26 cm/sec Left:  50  cm/sec Hepatic Artery Velocity: 118/19 cm/sec IVC:  Negative Pancreas: Visualized segments unremarkable, portions obscured by overlying bowel gas. Spleen:  No focal lesion, 13.1 x 5.7 x 12.6 cm (volume = 490 cm^3) Splenic Vein Velocity: 36 cm/sec Superior mesenteric vein velocity 33 cm/second Right Kidney:  No mass or hydronephrosis, 10.4cm in length. Left Kidney:  No lesion or hydronephrosis, 10cm in length. Abdominal aorta: Visualized segments unremarkable, portions obscured by overlying bowel gas. Varices: None identified Ascites: Present Bilateral pleural effusions noted. IMPRESSION: 1. Patent TIPS stent with reversal of flow within the right and left branches of the portal vein. 2. Nodular liver, splenomegaly, and ascites consistent with cirrhosis and portal venous hypertension. 3. Cholelithiasis without other ultrasound evidence of cholecystitis or biliary obstruction. Electronically Signed   By: DLucrezia EuropeM.D.   On: 01/15/2017 17:03   Ir Paracentesis  Result Date: 01/06/2017 CLINICAL DATA:  76year old female with NASH cirrhosis complicated by medically refractory large volume symptomatic ascites. She presents for paracentesis and elective creation of a TIPS. EXAM: 1. Ultrasound-guided paracentesis 2. Creation of a transjugular intrahepatic portosystemic shunt MEDICATIONS: As antibiotic prophylaxis, 3.375 g Zosyn was ordered pre-procedure and administered intravenously within one hour of incision. One unit of platelets was also administered IV during the procedure. ANESTHESIA/SEDATION: General - as administered by the Anesthesia department CONTRAST:  75 mL Isovue 370 FLUOROSCOPY TIME:  Fluoroscopy Time: 14 minutes 0 seconds (135 mGy). COMPLICATIONS: None immediate. PROCEDURE: Informed written consent was obtained from the patient after a thorough discussion of the procedural risks, benefits and alternatives. All questions were addressed.  Maximal Sterile Barrier Technique was utilized including caps, mask, sterile gowns, sterile gloves, sterile drape, hand hygiene and skin antiseptic. A timeout was performed prior to the initiation of the procedure. Ultrasound was used to interrogate the right lower quadrant. There is large volume ascites. A suitable skin entry site was selected. A small dermatotomy was made. Under real-time sonographic guidance, a Safe-T-Centesis catheter was advanced into the peritoneal space. The catheter was connected to fact a nurse suction and paracentesis was performed removing a total volume of 7200 mL amber colored ascites. Attention was next turned to the right upper quadrant. Using ultrasound, the liver was evaluated. A small peripheral portal venous branch was identified. Under real-time sonographic guidance, the peripheral portal venous branch was punctured with a 22 gauge Chiba needle. A gentle hand injection of contrast material confirmed location within the portal vein. The 0.018 wire was advanced into the main portal vein. The Chiba needle was then exchanged for the Accustick sheath was which was advanced into the main portal vein. A portal venogram was performed. The right internal jugular vein was interrogated with ultrasound and found to be widely patent. An  image was obtained and stored for the medical record. Local anesthesia was attained by infiltration with 1% lidocaine. A small dermatotomy was made. Under real-time sonographic guidance, the vessel was punctured with a 21 gauge micropuncture needle. Using standard technique, the initial micro needle was exchanged over a 0.018 micro wire for a transitional 4 [place] micro sheath. The micro sheath was then exchanged over a 0.035 wire for a 10 [place] dilator. The tract was dilated. A 10 French TIPS sheath was then advanced over the wire and positioned in the right atrium. An MPA catheter and Bentson wire were used to select the right hepatic vein. A right hepatic  venogram was performed. Using the coal open toe needle, the central aspect of the right portal vein was then successfully punctured. A glidewire was advanced in the main portal vein. A glide catheter was advanced over the wire. The glidewire was exchanged for a marker pigtail catheter. The main portal venous pressure was then measured. Pre TIPS main portal venous pressure:  23 mm Hg Pre TIPS right hepatic venous pressure:  10 mm Hg Pre TIPS portosystemic gradient: 13 mm Hg The Amplatz wire was reintroduced in the marker pigtail catheter was removed. The transhepatic tract was then dilated to 6 mm using a 6 x 40 mm Mustang balloon. A 6 (covered) by 10 mm Viatorr stent was then selected in advanced. The stent was deployed in the standard fashion and post dilated to 8 mm using an 8 x 40 mm Mustang balloon. The marker pigtail catheter was reintroduced. The portal pressures were again measured. Post TIPS main portal venous pressure:  20 mm Hg Post TIPS right hepatic venous pressure:  17 mm Hg Post TIPS portosystemic gradient: 3 mm Hg A final portal venogram was obtained. The TIPS is well positioned in widely patent. There is antegrade flow in the both the left and right main portal veins. The pigtail catheter was un formed and removed over a wire. The 10 French jugular sheath was removed and hemostasis was attained by manual pressure. The Safe-T-Centesis catheter was removed and a sterile bandage applied. The Accustick sheath was pulled back into the hepatic parenchyma and the tract was embolized with a Gel-Foam slurry as the sheath was removed. A sterile bandage was applied. The patient tolerated the procedure well. IMPRESSION: 1. Paracentesis removing 7.2 L ascites. 2. Successful TIPS creation using a 6 x 10 Viatorr stent from the right hepatic to the right portal vein. Patient will be admitted to the hospitalist service for overnight observation. Outpatient follow-up with a TIPS ultrasound in 1 month. Signed, Criselda Peaches, MD [facility] Radiology Electronically Signed   By: Jacqulynn Cadet M.D.   On: 01/06/2017 17:17   Ir Paracentesis  Result Date: 01/02/2017 INDICATION: Cirrhosis likely due to NASH, ascites. Request for therapeutic paracentesis. EXAM: ULTRASOUND GUIDED LEFT LATERAL ABDOMEN PARACENTESIS MEDICATIONS: 1% Lidocaine = 10 mL COMPLICATIONS: None immediate. PROCEDURE: Informed written consent was obtained from the patient after a discussion of the risks, benefits and alternatives to treatment. A timeout was performed prior to the initiation of the procedure. Initial ultrasound scanning demonstrates a large amount of ascites within the left lateral abdomen. The left lateral abdomen was prepped and draped in the usual sterile fashion. 1% lidocaine with epinephrine was used for local anesthesia. Following this, a 19 gauge, 7-cm, Yueh catheter was introduced. An ultrasound image was saved for documentation purposes. The paracentesis was performed. The catheter was removed and a dressing  was applied. The patient tolerated the procedure well without immediate post procedural complication. FINDINGS: A total of approximately 6 liters of clear yellow fluid was removed. Samples were sent to the laboratory as requested by the clinical team. IMPRESSION: Successful ultrasound-guided paracentesis yielding 6 liters of peritoneal fluid. Read by:  Gareth Eagle, PA-C Electronically Signed   By: Marybelle Killings M.D.   On: 01/02/2017 10:43      Subjective: No abdominal pain.   Discharge Exam: Vitals:   01/17/17 2040 01/18/17 0449  BP: (!) 111/34 (!) 108/31  Pulse: (!) 50 (!) 53  Resp: 16 16  Temp: 98.3 F (36.8 C) 98.5 F (36.9 C)  SpO2: 96% 97%   Vitals:   01/17/17 0449 01/17/17 1412 01/17/17 2040 01/18/17 0449  BP: (!) 104/34 (!) 101/33 (!) 111/34 (!) 108/31  Pulse: (!) 59 (!) 58 (!) 50 (!) 53  Resp: 17 16 16 16   Temp: 98.3 F (36.8 C) 98.2 F (36.8 C)  98.3 F (36.8 C) 98.5 F (36.9 C)  TempSrc: Oral Oral Oral Oral  SpO2: 96% 98% 96% 97%  Weight:      Height:        General exam: Appears calm and comfortable Respiratory system: Clear to auscultation. Respiratory effort normal. Cardiovascular system: S1 & S2 heard, RRR. 2/6 systolic murmur Gastrointestinal system: Soft, nontender, normal bowel sounds, distended. Central nervous system: Alert and oriented. No focal neurological deficits. Extremities: No edema. No calf tenderness Skin: No cyanosis. No rashes Psychiatry: Judgement and insight appear normal. Mood & affect appropriate.     The results of significant diagnostics from this hospitalization (including imaging, microbiology, ancillary and laboratory) are listed below for reference.     Microbiology: No results found for this or any previous visit (from the past 240 hour(s)).   Labs: BNP (last 3 results) No results for input(s): BNP in the last 8760 hours. Basic Metabolic Panel: Recent Labs  Lab 01/14/17 1344 01/15/17 0542 01/16/17 0541 01/17/17 0334 01/18/17 0429  NA 123* 127* 132* 129* 127*  K 4.5 3.9 4.0 4.1 3.7  CL 93* 99* 101 95* 94*  CO2 20* 21* 25 25 24   GLUCOSE 110* 166* 58* 146* 133*  BUN 24* 17 13 15 14   CREATININE 1.16* 1.12* 1.22* 1.26* 1.04*  CALCIUM 8.7* 8.2* 8.0* 8.1* 7.9*   Liver Function Tests: Recent Labs  Lab 01/14/17 1344 01/15/17 0542 01/16/17 0541 01/17/17 0334 01/18/17 0429  AST 118* 86* 107* 107* 86*  ALT 213* 142* 117* 105* 89*  ALKPHOS 349* 280* 278* 271* 251*  BILITOT 7.2* 5.7* 6.3* 6.3* 6.3*  PROT 5.9* 4.4* 4.5* 4.6* 4.4*  ALBUMIN 3.0* 2.2* 2.5* 2.5* 2.3*   No results for input(s): LIPASE, AMYLASE in the last 168 hours. Recent Labs  Lab 01/14/17 1432  AMMONIA 32   CBC: Recent Labs  Lab 01/14/17 1344 01/15/17 0542 01/16/17 0541 01/18/17 0429  WBC 11.2* 6.7 9.5 8.5  NEUTROABS 9.2*  --   --   --   HGB 13.0 10.4* 10.4* 10.1*  HCT 36.3 29.5* 30.9* 29.8*  MCV  87.9 87.5 90.4 90.3  PLT 86* 53* 57* 47*   Cardiac Enzymes: No results for input(s): CKTOTAL, CKMB, CKMBINDEX, TROPONINI in the last 168 hours. BNP: Invalid input(s): POCBNP CBG: Recent Labs  Lab 01/17/17 0650 01/17/17 1324 01/17/17 1746 01/17/17 2352 01/18/17 1206  GLUCAP 111* 146* 211* 141* 143*   D-Dimer No results for input(s): DDIMER in the last 72 hours. Hgb A1c No results for  input(s): HGBA1C in the last 72 hours. Lipid Profile No results for input(s): CHOL, HDL, LDLCALC, TRIG, CHOLHDL, LDLDIRECT in the last 72 hours. Thyroid function studies No results for input(s): TSH, T4TOTAL, T3FREE, THYROIDAB in the last 72 hours.  Invalid input(s): FREET3 Anemia work up No results for input(s): VITAMINB12, FOLATE, FERRITIN, TIBC, IRON, RETICCTPCT in the last 72 hours. Urinalysis    Component Value Date/Time   COLORURINE YELLOW 01/14/2017 1430   APPEARANCEUR CLEAR 01/14/2017 1430   LABSPEC 1.005 01/14/2017 1430   PHURINE 5.0 01/14/2017 1430   GLUCOSEU NEGATIVE 01/14/2017 1430   HGBUR NEGATIVE 01/14/2017 1430   BILIRUBINUR NEGATIVE 01/14/2017 1430   KETONESUR NEGATIVE 01/14/2017 1430   PROTEINUR NEGATIVE 01/14/2017 1430   UROBILINOGEN 0.2 10/08/2013 1746   NITRITE NEGATIVE 01/14/2017 1430   LEUKOCYTESUR NEGATIVE 01/14/2017 1430     Time coordinating discharge: Over 30 minutes  SIGNED:   Cordelia Poche, MD [facility] 01/18/2017, 12:59 PM Pager [phone]  If 7PM-7AM, please contact night-coverage [email or web address] Password [email or web address]

## 2017-01-18 NOTE — Progress Notes (Signed)
Referring Physician(s): Darci Current  Supervising Physician: Arne [place]  Patient Status:  [facility] - In-pt  Chief Complaint: Abdominal fullness, elevated liver function tests   Subjective: Patient feeling ok this morning.  Has less abdominal discomfort.  Denies nausea or vomiting; had BM this morning   Allergies: Biaxin [clarithromycin] and Sulfa antibiotics  Medications: Prior to Admission medications   Medication Sig Start Date End Date Taking? Authorizing Provider  acetaminophen (TYLENOL) 325 MG tablet Take 325 mg by mouth every 6 (six) hours as needed for mild pain or headache.   Yes [provider]  budesonide (PULMICORT) 0.5 MG/2ML nebulizer solution Take 0.5 mg by nebulization daily as needed (for shortness of breath).   Yes [provider]  CALCIUM LACTATE PO Take 6 tablets by mouth every evening.   Yes [provider]  diltiazem (CARDIZEM CD) 120 MG 24 hr capsule Take 1 capsule daily by mouth. 11/05/16  Yes [provider]  furosemide (LASIX) 40 MG tablet Take 40 mg by mouth daily.  11/11/16  Yes [provider]  glimepiride (AMARYL) 4 MG tablet Take 4 mg by mouth daily with breakfast.    Yes [provider]  lactulose (CHRONULAC) 10 GM/15ML solution Take 30-40 mLs daily by mouth. 01/08/17  Yes [provider]  Lancets (ONETOUCH ULTRASOFT) lancets 1 each by Other route 2 (two) times daily.  04/23/14  Yes [provider]  ondansetron (ZOFRAN ODT) 4 MG disintegrating tablet Take 1 tablet (4 mg total) every 6 (six) hours as needed by mouth for nausea or vomiting. 01/13/17  Yes Gatha Mayer, MD  PRESCRIPTION MEDICATION every 7 (seven) days. Allergy Shots    Yes [provider]  saxagliptin HCl (ONGLYZA) 5 MG TABS tablet Take 5 mg by mouth daily.   Yes [provider]  spironolactone (ALDACTONE) 50 MG tablet Take 50 mg by mouth daily.  11/11/16  Yes [provider]  VENTOLIN HFA 108  (90 Base) MCG/ACT inhaler Inhale 2 puffs into the lungs every 6 (six) hours as needed for wheezing or shortness of breath.  10/14/16  Yes [provider]  diltiazem (DILACOR XR) 120 MG 24 hr capsule Take 120 mg by mouth daily.    [provider]     Vital Signs: BP (!) 108/31 (BP Location: Right Arm)   Pulse (!) 53   Temp 98.5 F (36.9 C) (Oral)   Resp 16   Ht 5\' 2"  (1.575 m)   Wt 135 lb (61.2 kg)   SpO2 97%   BMI 24.69 kg/m   Physical Exam awake, alert.  Abdomen mildly distended, not significantly tender  Imaging: Dg Chest 2 View  Result Date: 01/14/2017 CLINICAL DATA:  Nausea and vomiting, history of recent TIPS EXAM: CHEST  2 VIEW COMPARISON:  08/14/2013, 12/17/2016 FINDINGS: Cardiac shadow is mildly prominent. Left pleural effusion with underlying atelectasis/infiltrate is noted. This is new from the most recent exam of 12/17/2016. Mild right basilar atelectasis is noted as well. A small right pleural effusion is also seen. Changes consistent with recent TIPS are noted. IMPRESSION: Bilateral pleural effusions with atelectatic changes left significantly greater than right. Electronically Signed   By: Inez Catalina M.D.   On: 01/14/2017 14:37   US Abdomen Complete  Result Date: 01/15/2017 CLINICAL DATA:  Transaminitis, effusion post paracentesis. Recent TIPS 01/06/2017. EXAM: COMPLETE ABDOMINAL ULTRASOUND US HEPATIC DOPPLER ARTERIAL/VENOUS ULTRASOUND COMPARISON:  MR 12/17/2016 FINDINGS: Gallbladder: Echogenic calculi in the dependent aspect of the gallbladder near the neck,  measured up to 0.8 cm diameter. No wall thickening. No sonographic Murphy sign per technologist. Common bile duct:  Normal in caliber, 3.9mm diameter. Liver: Nodular parenchymal contour and coarse echotexture. No focal lesion. Portal Vein Velocities Main:  41 cm/sec, hepatopetal Right: 24 cm/sec, hepatofugal Left:  6 cm/sec, hepatofugal TIPS stent velocities  (all antegrade): Proximal 119 cm/sec Mid  134 cm/sec Distal 100 cm/sec Hepatic Vein Velocities (all hepatofugal) Right:  24 cm/sec Middle:  26 cm/sec Left:  50  cm/sec Hepatic Artery Velocity: 118/19 cm/sec IVC:  Negative Pancreas: Visualized segments unremarkable, portions obscured by overlying bowel gas. Spleen:  No focal lesion, 13.1 x 5.7 x 12.6 cm (volume = 490 cm^3) Splenic Vein Velocity: 36 cm/sec Superior mesenteric vein velocity 33 cm/second Right Kidney:  No mass or hydronephrosis, 10.4cm in length. Left Kidney:  No lesion or hydronephrosis, 10cm in length. Abdominal aorta: Visualized segments unremarkable, portions obscured by overlying bowel gas. Varices: None identified Ascites: Present Bilateral pleural effusions noted. IMPRESSION: 1. Patent TIPS stent with reversal of flow within the right and left branches of the portal vein. 2. Nodular liver, splenomegaly, and ascites consistent with cirrhosis and portal venous hypertension. 3. Cholelithiasis without other ultrasound evidence of cholecystitis or biliary obstruction. Electronically Signed   By: Lucrezia Europe M.D.   On: 01/15/2017 17:03   US Paracentesis  Result Date: 01/15/2017 INDICATION: History of NASH cirrhosis with recurrent abdominal ascites. The patient is status post TIPS on January 06, 2017. Request has been made for paracentesis. EXAM: ULTRASOUND GUIDED THERAPEUTIC PARACENTESIS MEDICATIONS: 1% xylocaine COMPLICATIONS: None immediate. PROCEDURE: Informed written consent was obtained from the patient after a discussion of the risks, benefits and alternatives to treatment. A timeout was performed prior to the initiation of the procedure. Initial ultrasound scanning demonstrates a moderate amount of ascites within the right lower abdominal quadrant. The right lower abdomen was prepped and draped in the usual sterile fashion. 1% xylocaine was used for local anesthesia. Following this, a 19 gauge, 7-cm, Yueh catheter was introduced. An ultrasound image was saved for documentation  purposes. The paracentesis was performed. The catheter was removed and a dressing was applied. The patient tolerated the procedure well without immediate post procedural complication. FINDINGS: A total of approximately 4.4 L of icteric fluid was removed. IMPRESSION: Successful ultrasound-guided paracentesis yielding 4.4 liters of peritoneal fluid. Read by: Saverio Danker, PA-C Electronically Signed   By: Jerilynn Mages.  Shick M.D.   On: 01/15/2017 15:05   US Liver Doppler  Result Date: 01/15/2017 CLINICAL DATA:  Transaminitis, effusion post paracentesis. Recent TIPS 01/06/2017. EXAM: COMPLETE ABDOMINAL ULTRASOUND US HEPATIC DOPPLER ARTERIAL/VENOUS ULTRASOUND COMPARISON:  MR 12/17/2016 FINDINGS: Gallbladder: Echogenic calculi in the dependent aspect of the gallbladder near the neck, measured up to 0.8 cm diameter. No wall thickening. No sonographic Murphy sign per technologist. Common bile duct:  Normal in caliber, 3.42mm diameter. Liver: Nodular parenchymal contour and coarse echotexture. No focal lesion. Portal Vein Velocities Main:  41 cm/sec, hepatopetal Right: 24 cm/sec, hepatofugal Left:  6 cm/sec, hepatofugal TIPS stent velocities  (all antegrade): Proximal 119 cm/sec Mid 134 cm/sec Distal 100 cm/sec Hepatic Vein Velocities (all hepatofugal) Right:  24 cm/sec Middle:  26 cm/sec Left:  50  cm/sec Hepatic Artery Velocity: 118/19 cm/sec IVC:  Negative Pancreas: Visualized segments unremarkable, portions obscured by overlying bowel gas. Spleen:  No focal lesion, 13.1 x 5.7 x 12.6 cm (volume = 490 cm^3) Splenic Vein Velocity: 36 cm/sec Superior mesenteric vein velocity 33 cm/second Right Kidney:  No mass or  hydronephrosis, 10.4cm in length. Left Kidney:  No lesion or hydronephrosis, 10cm in length. Abdominal aorta: Visualized segments unremarkable, portions obscured by overlying bowel gas. Varices: None identified Ascites: Present Bilateral pleural effusions noted. IMPRESSION: 1. Patent TIPS stent with reversal of flow  within the right and left branches of the portal vein. 2. Nodular liver, splenomegaly, and ascites consistent with cirrhosis and portal venous hypertension. 3. Cholelithiasis without other ultrasound evidence of cholecystitis or biliary obstruction. Electronically Signed   By: Lucrezia Europe M.D.   On: 01/15/2017 17:03    Labs:  CBC: Recent Labs    01/14/17 1344 01/15/17 0542 01/16/17 0541 01/18/17 0429  WBC 11.2* 6.7 9.5 8.5  HGB 13.0 10.4* 10.4* 10.1*  HCT 36.3 29.5* 30.9* 29.8*  PLT 86* 53* 57* 47*    COAGS: Recent Labs    11/14/16 1144 12/11/16 1126 01/06/17 1200 01/14/17 1344  INR 1.2* 1.0 1.32 1.18  APTT  --   --  28  --     BMP: Recent Labs    01/15/17 0542 01/16/17 0541 01/17/17 0334 01/18/17 0429  NA 127* 132* 129* 127*  K 3.9 4.0 4.1 3.7  CL 99* 101 95* 94*  CO2 21* 25 25 24   GLUCOSE 166* 58* 146* 133*  BUN 17 13 15 14   CALCIUM 8.2* 8.0* 8.1* 7.9*  CREATININE 1.12* 1.22* 1.26* 1.04*  GFRNONAA 48* 43* 41* 52*  GFRAA 55* 50* 48* >60    LIVER FUNCTION TESTS: Recent Labs    01/15/17 0542 01/16/17 0541 01/17/17 0334 01/18/17 0429  BILITOT 5.7* 6.3* 6.3* 6.3*  AST 86* 107* 107* 86*  ALT 142* 117* 105* 89*  ALKPHOS 280* 278* 271* 251*  PROT 4.4* 4.5* 4.6* 4.4*  ALBUMIN 2.2* 2.5* 2.5* 2.3*    Assessment and Plan: Patient with history of NASH cirrhosis, ascites, status post TIPS on 11/5; TIPS patent on latest ultrasound with reversal of flow within the right and left branches of the portal vein;  4.4 liter para on 11/14; total bilirubin stable today, other LFTs slightly lower; afebrile; WBC nl; hgb 10.1, plts 47k; patient scheduled for follow-up in IR clinic on 02/18/17. Other plans as per GI/IM.   Electronically Signed: D. Rowe Robert, PA-C 01/18/2017, 9:36 AM   I spent a total of 15 minutes at the the patient's bedside AND on the patient's hospital floor or unit, greater than 50% of which was counseling/coordinating care for transjugular  intrahepatic portosystemic shunt    Patient ID: Cynthia Long, female   DOB: 1940/09/13, 76 y.o.   MRN: [id number]

## 2017-01-18 NOTE — Care Management Note (Signed)
Case Management Note  Patient Details  Name: Miku Udall MRN: [id number] Date of Birth: 1940/05/30  Subjective/Objective:   Pt refused 3n1, but does want RW and CM notified [facility] rep, Reggie to deliver to room prior to discharge.                  Action/Plan:CM will sign off for now but will be available should additional discharge needs arise or disposition change.    Expected Discharge Date:                  Expected Discharge Plan:  [place]  In-House Referral:  Clinical Social Work  Discharge planning Services  CM Consult  Post Acute Care Choice:    Choice offered to:  Patient  DME Arranged:  [place] rolling DME Agency:  [place]:    [place] Agency:     Status of Service:  Completed, signed off  If discussed at [place] of Stay Meetings, dates discussed:    Additional Comments:  Delrae Sawyers, RN 01/18/2017, 9:04 AM

## 2017-01-18 NOTE — Progress Notes (Addendum)
Discharge instructions gone over with patient. Home medications discussed. Appointments are scheduled, one to call. Diet, fluid restriction,  activity, reasons to call the doctor, and signs of worsening condition discussed. [facility] following patient at home. Additional information about thrombocytopenia and hyponatremia  given to patient. Patient verbalized understanding of instructions.

## 2017-01-20 NOTE — Care Management (Signed)
Mechele Claude at [facility] requesting discharge summary. [facility] phone number [phone] fax [phone]. MD aware. Will fax discharge summary once available.   Magdalen Spatz RN BSN [phone]

## 2017-01-29 ENCOUNTER — Telehealth: Payer: Self-pay

## 2017-01-29 DIAGNOSIS — K7460 Unspecified cirrhosis of liver: Secondary | ICD-10-CM

## 2017-01-29 DIAGNOSIS — R188 Other ascites: Principal | ICD-10-CM

## 2017-01-29 NOTE — Telephone Encounter (Signed)
Patient notified to go to [facility] for labs.  Order faxed to [phone].  She will try and get out for the labs tomorrow or Friday

## 2017-01-29 NOTE — Telephone Encounter (Signed)
-----   Message from Gatha Mayer, MD sent at 01/28/2017  2:43 PM EST ----- Regarding: FW: follow up labs Needs BMET this week please - they want to try [facility] due to proximity  thanks ----- Message ----- From: Vena Rua, PA-C Sent: 01/18/2017   2:05 PM To: Gatha Mayer, MD Subject: follow up labs                                 Can your staff arrange for BMET (or CMET if you prefer) at [facility] within next 7 to 10 days?  She has had blood drawn there cause it is closer to her home.   Thanks.

## 2017-01-30 ENCOUNTER — Other Ambulatory Visit (HOSPITAL_COMMUNITY)
Admission: RE | Admit: 2017-01-30 | Discharge: 2017-01-30 | Disposition: A | Discharge status: Home / Self Care | Payer: Medicare Other | Source: Ambulatory Visit | Attending: Internal Medicine | Admitting: Internal Medicine

## 2017-01-30 DIAGNOSIS — R188 Other ascites: Secondary | ICD-10-CM | POA: Diagnosis present

## 2017-01-30 DIAGNOSIS — K7460 Unspecified cirrhosis of liver: Secondary | ICD-10-CM | POA: Insufficient documentation

## 2017-01-30 LAB — BASIC METABOLIC PANEL
ANION GAP: 10 (ref 5–15)
BUN: 15 mg/dL (ref 6–20)
CO2: 21 mmol/L — AB (ref 22–32)
CREATININE: 0.69 mg/dL (ref 0.44–1.00)
Calcium: 8.7 mg/dL — ABNORMAL LOW (ref 8.9–10.3)
Chloride: 99 mmol/L — ABNORMAL LOW (ref 101–111)
Glucose, Bld: 143 mg/dL — ABNORMAL HIGH (ref 65–99)
Potassium: 3.5 mmol/L (ref 3.5–5.1)
SODIUM: 130 mmol/L — AB (ref 135–145)

## 2017-01-30 NOTE — Progress Notes (Signed)
BMEt is better - less abnormal  Nothing dangerous here  She has f/u 12/10

## 2017-02-10 ENCOUNTER — Ambulatory Visit: Payer: Medicare Other | Admitting: Internal Medicine

## 2017-02-18 ENCOUNTER — Ambulatory Visit
Admission: RE | Admit: 2017-02-18 | Discharge: 2017-02-18 | Disposition: A | Discharge status: Home / Self Care | Payer: Medicare Other | Source: Ambulatory Visit | Attending: Radiology | Admitting: Radiology

## 2017-02-18 ENCOUNTER — Encounter: Payer: Self-pay | Admitting: Radiology

## 2017-02-18 ENCOUNTER — Ambulatory Visit
Admission: RE | Admit: 2017-02-18 | Discharge: 2017-02-18 | Disposition: A | Discharge status: Home / Self Care | Payer: Medicare Other | Source: Ambulatory Visit | Attending: Interventional Radiology | Admitting: Interventional Radiology

## 2017-02-18 ENCOUNTER — Other Ambulatory Visit: Payer: Self-pay | Admitting: Radiology

## 2017-02-18 DIAGNOSIS — K7460 Unspecified cirrhosis of liver: Secondary | ICD-10-CM

## 2017-02-18 DIAGNOSIS — K7581 Nonalcoholic steatohepatitis (NASH): Principal | ICD-10-CM

## 2017-02-18 DIAGNOSIS — R188 Other ascites: Secondary | ICD-10-CM

## 2017-02-18 HISTORY — PX: IR RADIOLOGIST EVAL & MGMT: IMG5224

## 2017-02-18 NOTE — Progress Notes (Signed)
Chief Complaint: Patient was seen in consultation today for  Chief Complaint  Patient presents with  . Follow-up    6 wk follow up TIPS    Referring Physician(s): Dr. Silvano Rusk  History of Present Illness: Cynthia Long is a 76 y.o. female with NASH cirrhosis and refractory ascites. She underwent successful YIPS procedure on 11/5. She had to be readmitted with some jaundice temporarily but that has settled out. She is home doing pretty well. She continues to take lactulose twice daily as well as just 20mg  Lasix. She denies any mental status/confusion issues. She does feel a bit distended with ascites. She had to have 1 para of 4.4 L done about 4 weeks post TIPS. No other c/o. Appetite is good, no N/V TIPS US performed today She was not able to get labs drawn last week due to the weather.  Past Medical History:  Diagnosis Date  . Allergy   . Anemia   . ARF (acute renal failure) ([place]) 2015   ?prerenal azotemia- resolved by discharge  . Blood transfusion without reported diagnosis   . Bronchitis, acute   . Cholelithiasis   . Cirrhosis ([place]) 08/2013   likely from NASH  . Diabetes mellitus without complication ([place])   . Dyspnea    when fluid builds up in abdomen  . Dysrhythmia   . Esophageal varices ([place])   . Gastritis   . GI bleed 08/2013, 10/2013   EGD negative 08/2013.   Marland Kitchen Headache(784.0)   . Heart murmur   . Hypertension   . Obesity   . Pertussis 2011  . Tubular adenoma of colon   . Uncontrolled type 2 diabetes mellitus , without long-term current use of insulin ([place]) 11/15/2016    Past Surgical History:  Procedure Laterality Date  . COLONOSCOPY W/ BIOPSIES    . ENTEROSCOPY N/A 10/11/2013   Procedure: ENTEROSCOPY;  Surgeon: Lafayette Dragon, MD;  Location: [facility] ENDOSCOPY;  Service: Endoscopy;  Laterality: N/A;  . ESOPHAGOGASTRODUODENOSCOPY N/A 08/14/2013   Procedure: ESOPHAGOGASTRODUODENOSCOPY (EGD);  Surgeon: Inda Castle, MD;  Location: [facility];   Service: Endoscopy;  Laterality: N/A;  . ESOPHAGOGASTRODUODENOSCOPY N/A 10/08/2013   Procedure: ESOPHAGOGASTRODUODENOSCOPY (EGD);  Surgeon: [name], MD;  Location: [facility];  Service: Endoscopy;  Laterality: N/A;  . IR PARACENTESIS  01/02/2017  . IR PARACENTESIS  01/06/2017  . IR RADIOLOGIST EVAL & MGMT  12/11/2016  . IR TIPS  01/06/2017  . RADIOLOGY WITH ANESTHESIA N/A 01/06/2017   Procedure: TIPS;  Surgeon: Jacqulynn Cadet, MD;  Location: [facility];  Service: Radiology;  Laterality: N/A;  . VAGINAL HYSTERECTOMY     partial, for prolapsed uterus.    Allergies: Biaxin [clarithromycin] and Sulfa antibiotics  Medications: Prior to Admission medications   Medication Sig Start Date End Date Taking? Authorizing Provider  acetaminophen (TYLENOL) 325 MG tablet Take 325 mg by mouth every 6 (six) hours as needed for mild pain or headache.    [provider]  budesonide (PULMICORT) 0.5 MG/2ML nebulizer solution Take 0.5 mg by nebulization daily as needed (for shortness of breath).    [provider]  CALCIUM LACTATE PO Take 6 tablets by mouth every evening.    [provider]  diltiazem (CARDIZEM CD) 120 MG 24 hr capsule Take 1 capsule daily by mouth. 11/05/16   [provider]  diltiazem (DILACOR XR) 120 MG 24 hr capsule Take 120 mg by mouth daily.    [provider]  furosemide (LASIX) 40 MG  tablet Take 0.5 tablets (20 mg total) daily by mouth. 01/19/17   Mariel Aloe, MD  lactulose (CHRONULAC) 10 GM/15ML solution Take 30-40 mLs daily by mouth. 01/08/17   [provider]  Lancets (ONETOUCH ULTRASOFT) lancets 1 each by Other route 2 (two) times daily.  04/23/14   [provider]  ondansetron (ZOFRAN ODT) 4 MG disintegrating tablet Take 1 tablet (4 mg total) every 6 (six) hours as needed by mouth for nausea or vomiting. 01/13/17   Gatha Mayer, MD  PRESCRIPTION MEDICATION every 7 (seven) days. Allergy Shots     [provider]  spironolactone (ALDACTONE) 50 MG tablet Take 50 mg by mouth daily.  11/11/16   [provider]  VENTOLIN HFA 108 (90 Base) MCG/ACT inhaler Inhale 2 puffs into the lungs every 6 (six) hours as needed for wheezing or shortness of breath.  10/14/16   [provider]     Family History  Problem Relation Age of Onset  . Breast cancer Daughter   . Stomach cancer Neg Hx   . Colon cancer Neg Hx     Social History   Socioeconomic History  . Marital status: Single    Spouse name: Not on file  . Number of children: 2  . Years of education: Not on file  . Highest education level: Not on file  Social Needs  . Financial resource strain: Not on file  . Food insecurity - worry: Not on file  . Food insecurity - inability: Not on file  . Transportation needs - medical: Not on file  . Transportation needs - non-medical: Not on file  Occupational History  . Occupation: retired    Fish farm manager: Engineer, [employer] & [employer]  Tobacco Use  . Smoking status: Former Smoker    Years: 1.00    Types: Cigarettes    Last attempt to quit: 10/08/1961    Years since quitting: 55.4  . Smokeless tobacco: Never Used  . Tobacco comment: as a teenager-   Substance and Sexual Activity  . Alcohol use: No  . Drug use: No  . Sexual activity: Not Currently    Partners: Male  Other Topics Concern  . Not on file  Social History Narrative   Retired Human resources officer work   Divorced   1 son and 1 daughter     Review of Systems: A 12 point ROS discussed and pertinent positives are indicated in the HPI above.  All other systems are negative.  Review of Systems  Vital Signs: BP (!) 151/58   Pulse 93   Temp 97.8 F (36.6 C) (Oral)   Resp 15   Ht 5\' 2"  (1.575 m)   Wt 147 lb (66.7 kg)   SpO2 99%   BMI 26.89 kg/m   Physical Exam  Constitutional: She is oriented to person, place, and time. She appears well-developed. No distress.  HENT:  Head: Normocephalic.  Mouth/Throat: Oropharynx is clear and  moist.  Neck: Normal range of motion.  Cardiovascular: Normal rate, regular rhythm and normal heart sounds.  Pulmonary/Chest: Effort normal and breath sounds normal. No respiratory distress.  Abdominal: Soft. She exhibits distension. There is no tenderness. A hernia is present.  Musculoskeletal: She exhibits edema. She exhibits no tenderness.  Bilateral 2+ pitting edema  Neurological: She is alert and oriented to person, place, and time.  Skin: Skin is warm.  No appreciable jaundice today  Psychiatric: She has a normal mood and affect. Judgment normal.    Imaging: No  results found.  Labs:  CBC: Recent Labs    01/14/17 1344 01/15/17 0542 01/16/17 0541 01/18/17 0429  WBC 11.2* 6.7 9.5 8.5  HGB 13.0 10.4* 10.4* 10.1*  HCT 36.3 29.5* 30.9* 29.8*  PLT 86* 53* 57* 47*    COAGS: Recent Labs    11/14/16 1144 12/11/16 1126 01/06/17 1200 01/14/17 1344  INR 1.2* 1.0 1.32 1.18  APTT  --   --  28  --     BMP: Recent Labs    01/16/17 0541 01/17/17 0334 01/18/17 0429 01/30/17 1006  NA 132* 129* 127* 130*  K 4.0 4.1 3.7 3.5  CL 101 95* 94* 99*  CO2 25 25 24  21*  GLUCOSE 58* 146* 133* 143*  BUN 13 15 14 15   CALCIUM 8.0* 8.1* 7.9* 8.7*  CREATININE 1.22* 1.26* 1.04* 0.69  GFRNONAA 43* 41* 52* >60  GFRAA 50* 48* >60 >60    LIVER FUNCTION TESTS: Recent Labs    01/15/17 0542 01/16/17 0541 01/17/17 0334 01/18/17 0429  BILITOT 5.7* 6.3* 6.3* 6.3*  AST 86* 107* 107* 86*  ALT 142* 117* 105* 89*  ALKPHOS 280* 278* 271* 251*  PROT 4.4* 4.5* 4.6* 4.4*  ALBUMIN 2.2* 2.5* 2.5* 2.3*    TUMOR MARKERS: Recent Labs    11/14/16 1144  AFPTM 6.1*    Assessment and Plan: Karlene Lineman cirrhosis with refractory ascites S/p TIPS on 11/5 Now doing much better No encephalopathy. +Ascites today but appears to be taking longer to accumulate TIPS patent on [place] without increased or decreased velocities. Will get [place] para scheduled for next 1-2 days at [facility]. Also needs to get labs drawn  while at there. Repeat TIPS [place] in 3 months. Paras as needed, hopefully less frequent.  Thank you for this interesting consult.  I greatly enjoyed meeting Natasia Sanko and look forward to participating in their care.  A copy of this report was sent to the requesting provider on this date.  Electronically Signed: Ascencion Dike 02/18/2017, 11:48 AM   I spent a total of 25 minutes in face to face in clinical consultation, greater than 50% of which was counseling/coordinating care for TIPS follow up

## 2017-02-20 ENCOUNTER — Ambulatory Visit (HOSPITAL_COMMUNITY)
Admission: RE | Admit: 2017-02-20 | Discharge: 2017-02-20 | Disposition: A | Discharge status: Home / Self Care | Payer: Medicare Other | Source: Ambulatory Visit | Attending: Radiology | Admitting: Radiology

## 2017-02-20 ENCOUNTER — Other Ambulatory Visit (HOSPITAL_COMMUNITY)
Admission: RE | Admit: 2017-02-20 | Discharge: 2017-02-20 | Disposition: A | Discharge status: Home / Self Care | Payer: Medicare Other | Source: Ambulatory Visit | Attending: Interventional Radiology | Admitting: Interventional Radiology

## 2017-02-20 ENCOUNTER — Encounter (HOSPITAL_COMMUNITY): Payer: Self-pay

## 2017-02-20 DIAGNOSIS — K7581 Nonalcoholic steatohepatitis (NASH): Secondary | ICD-10-CM | POA: Insufficient documentation

## 2017-02-20 DIAGNOSIS — R188 Other ascites: Secondary | ICD-10-CM | POA: Insufficient documentation

## 2017-02-20 DIAGNOSIS — K7460 Unspecified cirrhosis of liver: Secondary | ICD-10-CM | POA: Insufficient documentation

## 2017-02-20 LAB — CBC
HEMATOCRIT: 37.8 % (ref 36.0–46.0)
HEMOGLOBIN: 12.6 g/dL (ref 12.0–15.0)
MCH: 32 pg (ref 26.0–34.0)
MCHC: 33.3 g/dL (ref 30.0–36.0)
MCV: 95.9 fL (ref 78.0–100.0)
Platelets: 95 10*3/uL — ABNORMAL LOW (ref 150–400)
RBC: 3.94 MIL/uL (ref 3.87–5.11)
RDW: 15.8 % — AB (ref 11.5–15.5)
WBC: 6.1 10*3/uL (ref 4.0–10.5)

## 2017-02-20 LAB — COMPREHENSIVE METABOLIC PANEL
ALT: 23 U/L (ref 14–54)
ANION GAP: 11 (ref 5–15)
AST: 41 U/L (ref 15–41)
Albumin: 2.4 g/dL — ABNORMAL LOW (ref 3.5–5.0)
Alkaline Phosphatase: 222 U/L — ABNORMAL HIGH (ref 38–126)
BILIRUBIN TOTAL: 6.1 mg/dL — AB (ref 0.3–1.2)
BUN: 13 mg/dL (ref 6–20)
CO2: 19 mmol/L — ABNORMAL LOW (ref 22–32)
Calcium: 8.4 mg/dL — ABNORMAL LOW (ref 8.9–10.3)
Chloride: 102 mmol/L (ref 101–111)
Creatinine, Ser: 0.82 mg/dL (ref 0.44–1.00)
Glucose, Bld: 171 mg/dL — ABNORMAL HIGH (ref 65–99)
POTASSIUM: 3.5 mmol/L (ref 3.5–5.1)
Sodium: 132 mmol/L — ABNORMAL LOW (ref 135–145)
TOTAL PROTEIN: 5.8 g/dL — AB (ref 6.5–8.1)

## 2017-02-20 LAB — PROTIME-INR
INR: 1.29
Prothrombin Time: 15.9 seconds — ABNORMAL HIGH (ref 11.4–15.2)

## 2017-02-20 NOTE — Progress Notes (Signed)
Paracentesis complete no signs of distress.  

## 2017-02-20 NOTE — Procedures (Signed)
PreOperative Dx: Cirrhosis likely due to NASH, ascites Postoperative Dx: Cirrhosis likely due to NASH, ascites Procedure:   US guided paracentesis Radiologist:  Thornton Papas Anesthesia:  10 ml of1% lidocaine Specimen:  6 L of bright yellow ascitic fluid EBL:   < 1 ml Complications: None

## 2017-03-03 ENCOUNTER — Inpatient Hospital Stay (HOSPITAL_COMMUNITY)
Admission: EM | Admit: 2017-03-03 | Discharge: 2017-03-13 | DRG: 982 | Disposition: A | Discharge status: Skilled Nursing Facility | Payer: Medicare Other | Attending: Orthopedic Surgery | Admitting: Orthopedic Surgery

## 2017-03-03 ENCOUNTER — Encounter (HOSPITAL_COMMUNITY): Payer: Self-pay

## 2017-03-03 ENCOUNTER — Other Ambulatory Visit: Payer: Self-pay

## 2017-03-03 ENCOUNTER — Emergency Department (HOSPITAL_COMMUNITY): Payer: Medicare Other

## 2017-03-03 DIAGNOSIS — N179 Acute kidney failure, unspecified: Secondary | ICD-10-CM | POA: Diagnosis present

## 2017-03-03 DIAGNOSIS — R51 Headache: Secondary | ICD-10-CM | POA: Diagnosis present

## 2017-03-03 DIAGNOSIS — R188 Other ascites: Secondary | ICD-10-CM | POA: Diagnosis present

## 2017-03-03 DIAGNOSIS — Z6826 Body mass index (BMI) 26.0-26.9, adult: Secondary | ICD-10-CM | POA: Diagnosis not present

## 2017-03-03 DIAGNOSIS — K7581 Nonalcoholic steatohepatitis (NASH): Secondary | ICD-10-CM | POA: Diagnosis present

## 2017-03-03 DIAGNOSIS — M545 Low back pain, unspecified: Secondary | ICD-10-CM

## 2017-03-03 DIAGNOSIS — Z79899 Other long term (current) drug therapy: Secondary | ICD-10-CM

## 2017-03-03 DIAGNOSIS — E669 Obesity, unspecified: Secondary | ICD-10-CM | POA: Diagnosis present

## 2017-03-03 DIAGNOSIS — J42 Unspecified chronic bronchitis: Secondary | ICD-10-CM | POA: Diagnosis present

## 2017-03-03 DIAGNOSIS — K7290 Hepatic failure, unspecified without coma: Principal | ICD-10-CM | POA: Diagnosis present

## 2017-03-03 DIAGNOSIS — D696 Thrombocytopenia, unspecified: Secondary | ICD-10-CM

## 2017-03-03 DIAGNOSIS — Z66 Do not resuscitate: Secondary | ICD-10-CM | POA: Diagnosis present

## 2017-03-03 DIAGNOSIS — E871 Hypo-osmolality and hyponatremia: Secondary | ICD-10-CM | POA: Diagnosis not present

## 2017-03-03 DIAGNOSIS — Z87891 Personal history of nicotine dependence: Secondary | ICD-10-CM

## 2017-03-03 DIAGNOSIS — J45998 Other asthma: Secondary | ICD-10-CM | POA: Diagnosis present

## 2017-03-03 DIAGNOSIS — Z419 Encounter for procedure for purposes other than remedying health state, unspecified: Secondary | ICD-10-CM

## 2017-03-03 DIAGNOSIS — K7460 Unspecified cirrhosis of liver: Secondary | ICD-10-CM | POA: Diagnosis present

## 2017-03-03 DIAGNOSIS — Z881 Allergy status to other antibiotic agents status: Secondary | ICD-10-CM | POA: Diagnosis not present

## 2017-03-03 DIAGNOSIS — Z9889 Other specified postprocedural states: Secondary | ICD-10-CM | POA: Diagnosis not present

## 2017-03-03 DIAGNOSIS — Z95828 Presence of other vascular implants and grafts: Secondary | ICD-10-CM | POA: Diagnosis present

## 2017-03-03 DIAGNOSIS — I10 Essential (primary) hypertension: Secondary | ICD-10-CM | POA: Diagnosis present

## 2017-03-03 DIAGNOSIS — R9389 Abnormal findings on diagnostic imaging of other specified body structures: Secondary | ICD-10-CM

## 2017-03-03 DIAGNOSIS — M4856XA Collapsed vertebra, not elsewhere classified, lumbar region, initial encounter for fracture: Secondary | ICD-10-CM | POA: Diagnosis present

## 2017-03-03 DIAGNOSIS — D6959 Other secondary thrombocytopenia: Secondary | ICD-10-CM | POA: Diagnosis present

## 2017-03-03 DIAGNOSIS — R402413 Glasgow coma scale score 13-15, at hospital admission: Secondary | ICD-10-CM | POA: Diagnosis present

## 2017-03-03 DIAGNOSIS — Z7952 Long term (current) use of systemic steroids: Secondary | ICD-10-CM

## 2017-03-03 DIAGNOSIS — E1165 Type 2 diabetes mellitus with hyperglycemia: Secondary | ICD-10-CM | POA: Diagnosis present

## 2017-03-03 DIAGNOSIS — E872 Acidosis: Secondary | ICD-10-CM | POA: Diagnosis present

## 2017-03-03 DIAGNOSIS — J45909 Unspecified asthma, uncomplicated: Secondary | ICD-10-CM | POA: Diagnosis present

## 2017-03-03 DIAGNOSIS — M62830 Muscle spasm of back: Secondary | ICD-10-CM | POA: Diagnosis present

## 2017-03-03 DIAGNOSIS — K7682 Hepatic encephalopathy: Secondary | ICD-10-CM | POA: Diagnosis present

## 2017-03-03 DIAGNOSIS — Z882 Allergy status to sulfonamides status: Secondary | ICD-10-CM | POA: Diagnosis not present

## 2017-03-03 DIAGNOSIS — E1100 Type 2 diabetes mellitus with hyperosmolarity without nonketotic hyperglycemic-hyperosmolar coma (NKHHC): Secondary | ICD-10-CM | POA: Diagnosis present

## 2017-03-03 HISTORY — DX: Unspecified cirrhosis of liver: K74.60

## 2017-03-03 HISTORY — DX: Other ascites: R18.8

## 2017-03-03 LAB — TROPONIN I

## 2017-03-03 LAB — URINALYSIS, ROUTINE W REFLEX MICROSCOPIC
BILIRUBIN URINE: NEGATIVE
GLUCOSE, UA: NEGATIVE mg/dL
HGB URINE DIPSTICK: NEGATIVE
Ketones, ur: NEGATIVE mg/dL
NITRITE: NEGATIVE
Protein, ur: NEGATIVE mg/dL
SPECIFIC GRAVITY, URINE: 1.025 (ref 1.005–1.030)
pH: 5 (ref 5.0–8.0)

## 2017-03-03 LAB — CBC WITH DIFFERENTIAL/PLATELET
BASOS PCT: 0 %
Basophils Absolute: 0 10*3/uL (ref 0.0–0.1)
EOS PCT: 1 %
Eosinophils Absolute: 0.1 10*3/uL (ref 0.0–0.7)
HEMATOCRIT: 38.3 % (ref 36.0–46.0)
HEMOGLOBIN: 13.2 g/dL (ref 12.0–15.0)
LYMPHS PCT: 10 %
Lymphs Abs: 0.7 10*3/uL (ref 0.7–4.0)
MCH: 32.1 pg (ref 26.0–34.0)
MCHC: 34.5 g/dL (ref 30.0–36.0)
MCV: 93.2 fL (ref 78.0–100.0)
MONO ABS: 0.6 10*3/uL (ref 0.1–1.0)
MONOS PCT: 8 %
NEUTROS PCT: 81 %
Neutro Abs: 5.8 10*3/uL (ref 1.7–7.7)
Platelets: 63 10*3/uL — ABNORMAL LOW (ref 150–400)
RBC: 4.11 MIL/uL (ref 3.87–5.11)
RDW: 15.1 % (ref 11.5–15.5)
WBC: 7.2 10*3/uL (ref 4.0–10.5)

## 2017-03-03 LAB — COMPREHENSIVE METABOLIC PANEL
ALBUMIN: 2.3 g/dL — AB (ref 3.5–5.0)
ALT: 30 U/L (ref 14–54)
AST: 53 U/L — AB (ref 15–41)
Alkaline Phosphatase: 215 U/L — ABNORMAL HIGH (ref 38–126)
Anion gap: 11 (ref 5–15)
BILIRUBIN TOTAL: 6.5 mg/dL — AB (ref 0.3–1.2)
BUN: 17 mg/dL (ref 6–20)
CO2: 20 mmol/L — AB (ref 22–32)
Calcium: 8.7 mg/dL — ABNORMAL LOW (ref 8.9–10.3)
Chloride: 102 mmol/L (ref 101–111)
Creatinine, Ser: 1 mg/dL (ref 0.44–1.00)
GFR calc Af Amer: >60 mL/min (ref >60)
GFR calc non Af Amer: 54 mL/min — ABNORMAL LOW (ref >60)
GLUCOSE: 153 mg/dL — AB (ref 65–99)
Potassium: 3.8 mmol/L (ref 3.5–5.1)
SODIUM: 133 mmol/L — AB (ref 135–145)
TOTAL PROTEIN: 6 g/dL — AB (ref 6.5–8.1)

## 2017-03-03 LAB — PROTIME-INR
INR: 1.18
Prothrombin Time: 14.9 seconds (ref 11.4–15.2)

## 2017-03-03 LAB — BRAIN NATRIURETIC PEPTIDE: B NATRIURETIC PEPTIDE 5: 143.6 pg/mL — AB (ref 0.0–100.0)

## 2017-03-03 LAB — CBG MONITORING, ED: Glucose-Capillary: 175 mg/dL — ABNORMAL HIGH (ref 65–99)

## 2017-03-03 LAB — AMMONIA: AMMONIA: 75 umol/L — AB (ref 9–35)

## 2017-03-03 LAB — I-STAT CG4 LACTIC ACID, ED: Lactic Acid, Venous: 2.88 mmol/L (ref 0.5–1.9)

## 2017-03-03 MED ORDER — SPIRONOLACTONE 25 MG PO TABS
50.0000 mg | ORAL_TABLET | Freq: Every day | ORAL | Status: DC
  Administered 2017-03-03 – 2017-03-11 (×9): 50 mg via ORAL
  Filled 2017-03-03 (×9): qty 2

## 2017-03-03 MED ORDER — DILTIAZEM HCL ER COATED BEADS 120 MG PO CP24
120.0000 mg | ORAL_CAPSULE | Freq: Every day | ORAL | Status: DC
  Administered 2017-03-03 – 2017-03-13 (×11): 120 mg via ORAL
  Filled 2017-03-03 (×12): qty 1

## 2017-03-03 MED ORDER — DOXYCYCLINE HYCLATE 100 MG IV SOLR
100.0000 mg | Freq: Once | INTRAVENOUS | Status: AC
  Administered 2017-03-03: 100 mg via INTRAVENOUS
  Filled 2017-03-03: qty 100

## 2017-03-03 MED ORDER — FUROSEMIDE 10 MG/ML IJ SOLN
20.0000 mg | Freq: Two times a day (BID) | INTRAMUSCULAR | Status: DC
  Administered 2017-03-03 – 2017-03-06 (×7): 20 mg via INTRAVENOUS
  Filled 2017-03-03 (×7): qty 2

## 2017-03-03 MED ORDER — LACTULOSE 10 GM/15ML PO SOLN
30.0000 g | Freq: Once | ORAL | Status: AC
  Administered 2017-03-03: 30 g via ORAL
  Filled 2017-03-03: qty 45

## 2017-03-03 MED ORDER — SODIUM CHLORIDE 0.9% FLUSH: 3.0000 mL | INTRAVENOUS | Status: DC | PRN

## 2017-03-03 MED ORDER — ONDANSETRON 4 MG PO TBDP
4.0000 mg | ORAL_TABLET | Freq: Four times a day (QID) | ORAL | Status: DC | PRN
  Filled 2017-03-03: qty 1

## 2017-03-03 MED ORDER — BUDESONIDE 0.5 MG/2ML IN SUSP: 0.5000 mg | Freq: Every day | RESPIRATORY_TRACT | Status: DC | PRN

## 2017-03-03 MED ORDER — ALBUTEROL SULFATE (2.5 MG/3ML) 0.083% IN NEBU: 3.0000 mL | INHALATION_SOLUTION | Freq: Four times a day (QID) | RESPIRATORY_TRACT | Status: DC | PRN

## 2017-03-03 MED ORDER — SODIUM CHLORIDE 0.9% FLUSH
3.0000 mL | Freq: Two times a day (BID) | INTRAVENOUS | Status: DC
  Administered 2017-03-03 – 2017-03-12 (×18): 3 mL via INTRAVENOUS

## 2017-03-03 MED ORDER — SODIUM CHLORIDE 0.9 % IV SOLN
250.0000 mL | INTRAVENOUS | Status: DC | PRN
  Administered 2017-03-12: 11:00:00 via INTRAVENOUS

## 2017-03-03 MED ORDER — SODIUM CHLORIDE 0.9 % IV BOLUS (SEPSIS)
500.0000 mL | Freq: Once | INTRAVENOUS | Status: AC
  Administered 2017-03-03: 500 mL via INTRAVENOUS

## 2017-03-03 MED ORDER — LACTULOSE 10 GM/15ML PO SOLN
20.0000 g | Freq: Three times a day (TID) | ORAL | Status: DC
  Administered 2017-03-03 – 2017-03-07 (×11): 20 g via ORAL
  Filled 2017-03-03 (×11): qty 30

## 2017-03-03 NOTE — H&P (Signed)
History and Physical    Cynthia Long RXV:[id number] DOB: 11/13/1940 DOA: 03/03/2017  PCP: Macon Large, MD Patient coming from: Home  Chief Complaint: Confusion  HPI: Cynthia Long is a 76 y.o. female with medical history significant of female, with history of Nonalcoholic steatohepatitis, diabetes mellitus, gastritis, esophageal varices.  She presented to the emergency department secondary to 3-day history of worsening confusion.  Patient was not oriented enough to provide a history.  History provided by her daughter.  Per daughter patient has been taking her lactulose, spironolactone, and Lasix as prescribed.  It is unknown if patient has been having any bowel movements in the last few days but her daughter feels like she has not.  She reports seeing some urine today that was dark and brown appearing.  Lactulose is not improved her mentation.  She last received a paracentesis on December 18 with some mild cramping afterwards.  This has resolved.  ED Course: Vitals: Afebrile, normal pulse, slightly hypertensive, on room air Labs: Sodium 133, CO2 of 20, Calcium of 8.7, alk phos of 215, albumin 2.3, AST 53, bilirubin 6.5, lactic acid 2.88, platelets of 63 Imaging: Stable pleural effusion w/ left base consolidation and new patchy airspace consolidation Medications/Course: Lactulose, doxycycline, 500 mL NS bolus  Review of Systems: Review of Systems  Constitutional: Negative for chills and fever.  Respiratory: Positive for cough and sputum production (clear). Negative for hemoptysis, shortness of breath and wheezing.   Cardiovascular: Positive for leg swelling. Negative for chest pain.  Gastrointestinal: Negative for abdominal pain, blood in stool, constipation, diarrhea, melena, nausea and vomiting.  Neurological: Positive for weakness. Negative for loss of consciousness.       Confusion  All other systems reviewed and are negative.   Past Medical History:  Diagnosis Date  . Allergy   .  Anemia   . ARF (acute renal failure) ([place]) 2015   ?prerenal azotemia- resolved by discharge  . Blood transfusion without reported diagnosis   . Bronchitis, acute   . Cholelithiasis   . Cirrhosis ([place]) 08/2013   likely from NASH  . Diabetes mellitus without complication ([place])   . Dyspnea    when fluid builds up in abdomen  . Dysrhythmia   . Esophageal varices ([place])   . Gastritis   . GI bleed 08/2013, 10/2013   EGD negative 08/2013.   Marland Kitchen Headache(784.0)   . Heart murmur   . Hypertension   . Obesity   . Pertussis 2011  . Tubular adenoma of colon   . Uncontrolled type 2 diabetes mellitus , without long-term current use of insulin ([place]) 11/15/2016    Past Surgical History:  Procedure Laterality Date  . COLONOSCOPY W/ BIOPSIES    . ENTEROSCOPY N/A 10/11/2013   Procedure: ENTEROSCOPY;  Surgeon: Lafayette Dragon, MD;  Location: [facility];  Service: Endoscopy;  Laterality: N/A;  . ESOPHAGOGASTRODUODENOSCOPY N/A 08/14/2013   Procedure: ESOPHAGOGASTRODUODENOSCOPY (EGD);  Surgeon: Inda Castle, MD;  Location: [facility];  Service: Endoscopy;  Laterality: N/A;  . ESOPHAGOGASTRODUODENOSCOPY N/A 10/08/2013   Procedure: ESOPHAGOGASTRODUODENOSCOPY (EGD);  Surgeon: [name], MD;  Location: [facility];  Service: Endoscopy;  Laterality: N/A;  . IR PARACENTESIS  01/02/2017  . IR PARACENTESIS  01/06/2017  . IR RADIOLOGIST EVAL & MGMT  12/11/2016  . IR RADIOLOGIST EVAL & MGMT  02/18/2017  . IR TIPS  01/06/2017  . RADIOLOGY WITH ANESTHESIA N/A 01/06/2017   Procedure: TIPS;  Surgeon: Jacqulynn Cadet, MD;  Location: [facility];  Service: Radiology;  Laterality: N/A;  . VAGINAL HYSTERECTOMY     partial, for prolapsed uterus.     reports that she quit smoking about 55 years ago. Her smoking use included cigarettes. She quit after 1.00 year of use. she has never used smokeless tobacco. She reports that she does not drink alcohol or use drugs.  Allergies  Allergen Reactions  . Biaxin [Clarithromycin]       Liver enzymes elevated per pt  . Sulfa Antibiotics Diarrhea    Family History  Problem Relation Age of Onset  . Breast cancer Daughter   . Stomach cancer Neg Hx   . Colon cancer Neg Hx     Prior to Admission medications   Medication Sig Start Date End Date Taking? Authorizing Provider  acetaminophen (TYLENOL) 325 MG tablet Take 325 mg by mouth every 6 (six) hours as needed for mild pain or headache.   Yes [provider]  budesonide (PULMICORT) 0.5 MG/2ML nebulizer solution Take 0.5 mg by nebulization daily as needed (for shortness of breath).   Yes [provider]  furosemide (LASIX) 40 MG tablet Take 0.5 tablets (20 mg total) daily by mouth. 01/19/17  Yes Mariel Aloe, MD  lactulose (CHRONULAC) 10 GM/15ML solution Take 30 mLs by mouth 3 (three) times daily.  01/08/17  Yes [provider]  Lancets (ONETOUCH ULTRASOFT) lancets 1 each by Other route 2 (two) times daily.  04/23/14  Yes [provider]  ondansetron (ZOFRAN ODT) 4 MG disintegrating tablet Take 1 tablet (4 mg total) every 6 (six) hours as needed by mouth for nausea or vomiting. 01/13/17  Yes Gatha Mayer, MD  spironolactone (ALDACTONE) 50 MG tablet Take 50 mg by mouth daily.  11/11/16  Yes [provider]  VENTOLIN HFA 108 (90 Base) MCG/ACT inhaler Inhale 2 puffs into the lungs every 6 (six) hours as needed for wheezing or shortness of breath.  10/14/16  Yes [provider]  diltiazem (CARDIZEM CD) 120 MG 24 hr capsule Take 1 capsule daily by mouth. 11/05/16   [provider]  diltiazem (DILACOR XR) 120 MG 24 hr capsule Take 120 mg by mouth daily.    [provider]    Physical Exam: Vitals:   03/03/17 1515 03/03/17 1615 03/03/17 1630 03/03/17 1700  BP: (!) 145/58 (!) 145/58 (!) 144/79 (!) 141/62  Pulse: 96 99 98 (!) 101  Resp: 18 (!) 21 (!) 22 18  Temp:      TempSrc:      SpO2: 98% 97% 98% 97%  Weight:      Height:         Constitutional:  NAD, calm, comfortable Eyes: PERRL, lids and conjunctivae normal ENMT: Mucous membranes are moist. Posterior pharynx clear of any exudate or lesions. Neck: normal, supple, no masses, no thyromegaly Respiratory: clear to auscultation bilaterally, no wheezing, no crackles. Normal respiratory effort. No accessory muscle use.  Cardiovascular: Regular rate and rhythm, no murmurs / rubs / gallops. 2+ pitting edema up to thighs bilaterally Abdomen: No tenderness on palpitation, normal bowel sounds, significantly distended.  Musculoskeletal: no clubbing / cyanosis. No joint deformity upper and lower extremities. Good ROM, no contractures. Normal muscle tone.  Skin: no rashes, ulcers. No induration Neurologic: CN 2-12 grossly intact. Sensation intact, DTR normal. Strength 4/5 in all 4.  Psychiatric: Impaired judgment and insight. Alert and oriented x 2.    Labs on Admission: I have personally reviewed following labs and imaging studies  CBC: Recent Labs  Lab 03/03/17  1221  WBC 7.2  NEUTROABS 5.8  HGB 13.2  HCT 38.3  MCV 93.2  PLT 63*   Basic Metabolic Panel: Recent Labs  Lab 03/03/17 1221  NA 133*  K 3.8  CL 102  CO2 20*  GLUCOSE 153*  BUN 17  CREATININE 1.00  CALCIUM 8.7*   GFR: Estimated Creatinine Clearance: 42.5 mL/min (by C-G formula based on SCr of 1 mg/dL). Liver Function Tests: Recent Labs  Lab 03/03/17 1221  AST 53*  ALT 30  ALKPHOS 215*  BILITOT 6.5*  PROT 6.0*  ALBUMIN 2.3*   No results for input(s): LIPASE, AMYLASE in the last 168 hours. Recent Labs  Lab 03/03/17 1210  AMMONIA 75*   Coagulation Profile: Recent Labs  Lab 03/03/17 1221  INR 1.18   Cardiac Enzymes: Recent Labs  Lab 03/03/17 1221  TROPONINI <0.03   Urine analysis:    Component Value Date/Time   COLORURINE YELLOW 01/14/2017 1430   APPEARANCEUR CLEAR 01/14/2017 1430   LABSPEC 1.005 01/14/2017 1430   PHURINE 5.0 01/14/2017 1430   GLUCOSEU NEGATIVE 01/14/2017 1430   HGBUR  NEGATIVE 01/14/2017 1430   BILIRUBINUR NEGATIVE 01/14/2017 1430   KETONESUR NEGATIVE 01/14/2017 1430   PROTEINUR NEGATIVE 01/14/2017 1430   UROBILINOGEN 0.2 10/08/2013 1746   NITRITE NEGATIVE 01/14/2017 1430   LEUKOCYTESUR NEGATIVE 01/14/2017 1430     Radiological Exams on Admission: Dg Chest 2 View  Result Date: 03/03/2017 CLINICAL DATA:  Altered mental status EXAM: CHEST  2 VIEW COMPARISON:  January 14, 2017. FINDINGS: There is a the persistent moderate left pleural effusion. There is persistent consolidation throughout much of the left lower lobe and portions of the lingula. There is also new patchy consolidation in the right lower lobe. There is a minimal right pleural effusion. Lungs elsewhere are clear. Heart size and pulmonary vascularity are normal. No adenopathy. There is aortic atherosclerosis. No bone lesions. No adenopathy. A TIPS shunt is noted in the right upper quadrant. IMPRESSION: Pleural effusion on the left with left base consolidation, stable. New patchy airspace consolidation right base with minimal right pleural effusion. Stable cardiac silhouette. There is aortic atherosclerosis. Aortic Atherosclerosis (ICD10-I70.0). Electronically Signed   By: Lowella Grip III M.D.   On: 03/03/2017 11:40    EKG: Independently reviewed. NSR  Assessment/Plan Active Problems:   HTN (hypertension)   Chronic allergic bronchitis   Hepatic cirrhosis (HCC)   Ascites   Uncontrolled type 2 diabetes mellitus , without long-term current use of insulin (HCC)   Liver cirrhosis secondary to NASH (HCC)   Hepatic encephalopathy (HCC)   Thrombocytopenia (HCC)  Hepatic encephalopathy Ammonia elevated at 75. Patient altered but alert. -Lactulose TID; may need to convert to enema if mentation worsens. Titrate for 2-3 soft bowel movements per day. -US liver doppler per IR recommendations -Patient with subjective mild abdominal pain and no elevated WBC. Vitals stable. ?SBP. Will place order  for US paracentesis with labs. 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 Carlean Purl as an outpatient. 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 Patient has had repeat paracenteses for ascites. 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 No evidence of bleeding. -repeat CBC in AM  Chronic allergic bronchitis Chronic cough which is stable.  -Continue albuterol and Pulmicort  History of diabetes mellitus Patient is diet controlled. Last A1C of 5.4.  Abnormal chest x-ray Possibly secondary to fluid overload. Given doxycycline in ED. Chronic cough. -follow clinically   DVT prophylaxis: SCDs secondary to thrombocytopenia  Code Status: DNR Family Communication: Daughter and son-in-law Disposition Plan: Likely home when medically stable. Consults called: [facility] and IR by EDP Admission status: Inpatient, Medical floor   Cordelia Poche, MD [facility] Pager [phone]  If 7PM-7AM, please contact night-coverage [email or web address] Password [facility]  03/03/2017, 5:24 PM

## 2017-03-03 NOTE — ED Provider Notes (Signed)
Emergency Department Provider Note   I have reviewed the triage vital signs and the nursing notes.   HISTORY  Chief Complaint Altered Mental Status   HPI Cynthia Long is a 76 y.o. female history of acute renal failure, anemia, cirrhosis from [place] status post TIPS procedure a month ago that presents to the emergency department today with progressively worsening lower extremity edema and altered mental status.  It sounds like the edema has been progressing for the last couple weeks but the altered mental status started on Thursday or Friday where she is more sleepy than little bit more confused.  Within this morning the patient was acutely altered so they brought her here for further evaluation.  Here the patient does appear sleepy on exam but is able to answer simple yes/no questions but quickly falls asleep.  They do endorse cough but this is chronic does seem to be worsened in the last week or so per the 1 sibling but other sibling is not so sure.  No fevers.  No productive component. No other associated or modifying symptoms.    Past Medical History:  Diagnosis Date  . Allergy   . Anemia   . ARF (acute renal failure) ([place]) 2015   ?prerenal azotemia- resolved by discharge  . Blood transfusion without reported diagnosis   . Bronchitis, acute   . Cholelithiasis   . Cirrhosis ([place]) 08/2013   likely from NASH  . Diabetes mellitus without complication ([place])   . Dyspnea    when fluid builds up in abdomen  . Dysrhythmia   . Esophageal varices ([place])   . Gastritis   . GI bleed 08/2013, 10/2013   EGD negative 08/2013.   Marland Kitchen Headache(784.0)   . Heart murmur   . Hypertension   . Obesity   . Pertussis 2011  . Tubular adenoma of colon   . Uncontrolled type 2 diabetes mellitus , without long-term current use of insulin ([place]) 11/15/2016    Patient Active Problem List   Diagnosis Date Noted  . Liver cirrhosis secondary to NASH ([place]) 01/06/2017  . Abnormal MRI, liver 11/15/2016  .  Uncontrolled type 2 diabetes mellitus , without long-term current use of insulin ([place]) 11/15/2016  . Hepatic cirrhosis ([place]) 09/15/2013  . Ascites 09/15/2013  . HTN (hypertension) 08/13/2013  . Chronic allergic bronchitis 08/13/2013    Past Surgical History:  Procedure Laterality Date  . COLONOSCOPY W/ BIOPSIES    . ENTEROSCOPY N/A 10/11/2013   Procedure: ENTEROSCOPY;  Surgeon: Lafayette Dragon, MD;  [facility];  Service: Endoscopy;  Laterality: N/A;  . ESOPHAGOGASTRODUODENOSCOPY N/A 08/14/2013   Procedure: ESOPHAGOGASTRODUODENOSCOPY (EGD);  Surgeon: Inda Castle, MD;  Location: [facility];  Service: Endoscopy;  Laterality: N/A;  . ESOPHAGOGASTRODUODENOSCOPY N/A 10/08/2013   Procedure: ESOPHAGOGASTRODUODENOSCOPY (EGD);  Surgeon: [name], MD;  Location: [facility];  Service: Endoscopy;  Laterality: N/A;  . IR PARACENTESIS  01/02/2017  . IR PARACENTESIS  01/06/2017  . IR RADIOLOGIST EVAL & MGMT  12/11/2016  . IR RADIOLOGIST EVAL & MGMT  02/18/2017  . IR TIPS  01/06/2017  . RADIOLOGY WITH ANESTHESIA N/A 01/06/2017   Procedure: TIPS;  Surgeon: Jacqulynn Cadet, MD;  Location: [facility];  Service: Radiology;  Laterality: N/A;  . VAGINAL HYSTERECTOMY     partial, for prolapsed uterus.    Current Outpatient Rx  . Order #: [id number] Class: Historical Med  . Order #: [id number] Class: Historical Med  . Order #: [id number] Class: No Print  . Order #: [id number] Class:  Historical Med  . Order #: [id number] Class: Historical Med  . Order #: [id number] Class: Normal  . Order #: [id number] Class: Historical Med  . Order #: [id number] Class: Historical Med  . Order #: [id number] Class: Historical Med  . Order #: [id number] Class: Historical Med    Allergies Biaxin [clarithromycin] and Sulfa antibiotics  Family History  Problem Relation Age of Onset  . Breast cancer Daughter   . Stomach cancer Neg Hx   . Colon cancer Neg Hx     Social History Social History   Tobacco Use  . Smoking  status: Former Smoker    Years: 1.00    Types: Cigarettes    Last attempt to quit: 10/08/1961    Years since quitting: 55.4  . Smokeless tobacco: Never Used  . Tobacco comment: as a teenager-   Substance Use Topics  . Alcohol use: No  . Drug use: No    Review of Systems  All other systems negative except as documented in the HPI. All pertinent positives and negatives as reviewed in the HPI. ____________________________________________   PHYSICAL EXAM:  VITAL SIGNS: ED Triage Vitals [03/03/17 1041]  Enc Vitals Group     BP (!) 157/61     Pulse Rate 82     Resp 16     Temp (!) 97.4 F (36.3 C)     Temp Source Oral     SpO2 98 %     Weight 135 lb (61.2 kg)     Height 5\' 2"  (1.575 m)    Constitutional: Alert and oriented but sleepy. Well appearing and in no acute distress. Eyes: Conjunctivae are normal. PERRL. EOMI. Head: Atraumatic. Nose: No congestion/rhinnorhea. Mouth/Throat: Mucous membranes are moist.  Oropharynx non-erythematous. Neck: No stridor.  No meningeal signs.   Cardiovascular: Normal rate, regular rhythm. Good peripheral circulation. Grossly normal heart sounds.   Respiratory: Normal respiratory effort.  No retractions. Lungs diminished especially in bases. Gastrointestinal: Soft and nontender. Significant distention with a fluid wave.  Musculoskeletal: No lower extremity tenderness. Does have edema to hips. No gross deformities of extremities. Neurologic:  Normal speech and language. No gross focal neurologic deficits are appreciated.  Skin:  Skin is warm, dry and intact. No rash noted.  ____________________________________________   LABS (all labs ordered are listed, but only abnormal results are displayed)  Labs Reviewed  COMPREHENSIVE METABOLIC PANEL - Abnormal; Notable for the following components:      Result Value   Sodium 133 (*)    CO2 20 (*)    Glucose, Bld 153 (*)    Calcium 8.7 (*)    Total Protein 6.0 (*)    Albumin 2.3 (*)    AST 53  (*)    Alkaline Phosphatase 215 (*)    Total Bilirubin 6.5 (*)    GFR calc non Af Amer 54 (*)    All other components within normal limits  CBC WITH DIFFERENTIAL/PLATELET - Abnormal; Notable for the following components:   Platelets 63 (*)    All other components within normal limits  AMMONIA - Abnormal; Notable for the following components:   Ammonia 75 (*)    All other components within normal limits  I-STAT CG4 LACTIC ACID, ED - Abnormal; Notable for the following components:   Lactic Acid, Venous 2.88 (*)    All other components within normal limits  CULTURE, BLOOD (ROUTINE X 2)  CULTURE, BLOOD (ROUTINE X 2)  PROTIME-INR  TROPONIN I  URINALYSIS, ROUTINE W REFLEX MICROSCOPIC  BRAIN NATRIURETIC PEPTIDE  I-STAT CG4  LACTIC ACID, ED   ____________________________________________  EKG   EKG Interpretation  Date/Time:  Monday March 03 2017 10:38:13 EST Ventricular Rate:  93 PR Interval:  136 QRS Duration: 76 QT Interval:  364 QTC Calculation: 452 R Axis:   15 Text Interpretation:  Normal sinus rhythm Low voltage QRS Cannot rule out Anteroseptal infarct , age undetermined Abnormal ECG No significant change since last tracing Confirmed by Merrily Pew [phone]) on 03/03/2017 4:01:02 PM       ____________________________________________  RADIOLOGY  Dg Chest 2 View  Result Date: 03/03/2017 CLINICAL DATA:  Altered mental status EXAM: CHEST  2 VIEW COMPARISON:  January 14, 2017. FINDINGS: There is a the persistent moderate left pleural effusion. There is persistent consolidation throughout much of the left lower lobe and portions of the lingula. There is also new patchy consolidation in the right lower lobe. There is a minimal right pleural effusion. Lungs elsewhere are clear. Heart size and pulmonary vascularity are normal. No adenopathy. There is aortic atherosclerosis. No bone lesions. No adenopathy. A TIPS shunt is noted in the right upper quadrant. IMPRESSION: Pleural  effusion on the left with left base consolidation, stable. New patchy airspace consolidation right base with minimal right pleural effusion. Stable cardiac silhouette. There is aortic atherosclerosis. Aortic Atherosclerosis (ICD10-I70.0). Electronically Signed   By: Lowella Grip III M.D.   On: 03/03/2017 11:40    ____________________________________________   PROCEDURES  Procedure(s) performed:   Procedures   ____________________________________________   INITIAL IMPRESSION / ASSESSMENT AND PLAN / ED COURSE  Concern for possible tips failure.  Worsening hepatic encephalopathy.  Also anasarca.  Patient will be admitted for further workup and likely paracentesis.  Lactulose ordered. XR with concern for possible consolidation. I suspect it is fluid.  Discussed with IR recommending Liver doppler, ordered.    Pertinent labs & imaging results that were available during my care of the patient were reviewed by me and considered in my medical decision making (see chart for details).  ____________________________________________  FINAL CLINICAL IMPRESSION(S) / ED DIAGNOSES  Final diagnoses:  S/P TIPS (transjugular intrahepatic portosystemic shunt)     MEDICATIONS GIVEN DURING THIS VISIT:  Medications  doxycycline (VIBRAMYCIN) 100 mg in dextrose 5 % 250 mL IVPB (100 mg Intravenous New Bag/Given 03/03/17 1513)  lactulose (CHRONULAC) 10 GM/15ML solution 30 g (30 g Oral Given 03/03/17 1624)  sodium chloride 0.9 % bolus 500 mL (500 mLs Intravenous New Bag/Given 03/03/17 1632)     Aquan Kope, Corene Cornea, MD 03/03/17 [phone]

## 2017-03-03 NOTE — ED Triage Notes (Signed)
Patient from home brought in by family where she lives alone but is checked on daily by family. Patient is becoming increasingly confused since Saturday. Patient currently alert and follows commands but does not speak. Skin is jaundiced. Vital signs within normal limits. At baseline patient is alert and oriented and performs ADL's independently.

## 2017-03-03 NOTE — ED Notes (Signed)
Purewick placed on pt with assistance of Carlis Abbott, [employer]

## 2017-03-04 ENCOUNTER — Inpatient Hospital Stay (HOSPITAL_COMMUNITY): Payer: Medicare Other

## 2017-03-04 DIAGNOSIS — R188 Other ascites: Secondary | ICD-10-CM

## 2017-03-04 DIAGNOSIS — R9389 Abnormal findings on diagnostic imaging of other specified body structures: Secondary | ICD-10-CM

## 2017-03-04 HISTORY — DX: Other ascites: R18.8

## 2017-03-04 LAB — PROTEIN, PLEURAL OR PERITONEAL FLUID: Total protein, fluid: <3 g/dL

## 2017-03-04 LAB — COMPREHENSIVE METABOLIC PANEL
ALBUMIN: 2 g/dL — AB (ref 3.5–5.0)
ALT: 25 U/L (ref 14–54)
AST: 42 U/L — AB (ref 15–41)
Alkaline Phosphatase: 167 U/L — ABNORMAL HIGH (ref 38–126)
Anion gap: 12 (ref 5–15)
BUN: 18 mg/dL (ref 6–20)
CHLORIDE: 103 mmol/L (ref 101–111)
CO2: 19 mmol/L — AB (ref 22–32)
CREATININE: 0.9 mg/dL (ref 0.44–1.00)
Calcium: 8.3 mg/dL — ABNORMAL LOW (ref 8.9–10.3)
GFR calc Af Amer: >60 mL/min (ref >60)
GLUCOSE: 142 mg/dL — AB (ref 65–99)
Potassium: 3.4 mmol/L — ABNORMAL LOW (ref 3.5–5.1)
Sodium: 134 mmol/L — ABNORMAL LOW (ref 135–145)
Total Bilirubin: 7 mg/dL — ABNORMAL HIGH (ref 0.3–1.2)
Total Protein: 5.2 g/dL — ABNORMAL LOW (ref 6.5–8.1)

## 2017-03-04 LAB — GRAM STAIN

## 2017-03-04 LAB — CBC
HEMATOCRIT: 34.1 % — AB (ref 36.0–46.0)
HEMOGLOBIN: 11.7 g/dL — AB (ref 12.0–15.0)
MCH: 31.7 pg (ref 26.0–34.0)
MCHC: 34.3 g/dL (ref 30.0–36.0)
MCV: 92.4 fL (ref 78.0–100.0)
Platelets: 89 10*3/uL — ABNORMAL LOW (ref 150–400)
RBC: 3.69 MIL/uL — ABNORMAL LOW (ref 3.87–5.11)
RDW: 15.1 % (ref 11.5–15.5)
WBC: 7.4 10*3/uL (ref 4.0–10.5)

## 2017-03-04 LAB — BODY FLUID CELL COUNT WITH DIFFERENTIAL
LYMPHS FL: 12 %
MONOCYTE-MACROPHAGE-SEROUS FLUID: 82 % (ref 50–90)
Neutrophil Count, Fluid: 6 % (ref 0–25)
Total Nucleated Cell Count, Fluid: 64 cu mm (ref 0–1000)

## 2017-03-04 LAB — ALBUMIN, PLEURAL OR PERITONEAL FLUID

## 2017-03-04 LAB — LACTATE DEHYDROGENASE, PLEURAL OR PERITONEAL FLUID: LD FL: 45 U/L — AB (ref 3–23)

## 2017-03-04 LAB — GLUCOSE, PLEURAL OR PERITONEAL FLUID: Glucose, Fluid: 160 mg/dL

## 2017-03-04 LAB — LACTIC ACID, PLASMA: LACTIC ACID, VENOUS: 2.7 mmol/L — AB (ref 0.5–1.9)

## 2017-03-04 LAB — PROTIME-INR
INR: 1.32
Prothrombin Time: 16.3 seconds — ABNORMAL HIGH (ref 11.4–15.2)

## 2017-03-04 LAB — GLUCOSE, CAPILLARY: GLUCOSE-CAPILLARY: 177 mg/dL — AB (ref 65–99)

## 2017-03-04 LAB — AMMONIA: Ammonia: 45 umol/L — ABNORMAL HIGH (ref 9–35)

## 2017-03-04 MED ORDER — LIDOCAINE HCL (PF) 1 % IJ SOLN
INTRAMUSCULAR | Status: AC
  Filled 2017-03-04: qty 30

## 2017-03-04 NOTE — Progress Notes (Signed)
PT Cancellation Note  Patient Details Name: Cynthia Long MRN: [id number] DOB: 09-18-1940   Cancelled Treatment:    Reason Eval/Treat Not Completed: Patient at procedure or test/unavailable. Pt off unit at the time of PT evaluation attempt. Will check back as schedule allows.   Thelma Comp 03/04/2017, 10:24 AM   Rolinda Roan, PT, DPT Acute Rehabilitation Services Pager: [phone]

## 2017-03-04 NOTE — Procedures (Signed)
PROCEDURE SUMMARY:  Successful US guided paracentesis from right lateral abdomen.  Yielded 5.3 liters of clear yellow fluid.  No immediate complications.  Pt tolerated well.   Specimen was sent for labs.  Anaiah Mcmannis S Shevon Sian PA-C 03/04/2017 9:54 AM

## 2017-03-04 NOTE — Evaluation (Signed)
Physical Therapy Evaluation Patient Details Name: Cynthia Long MRN: [id number] DOB: Jan 31, 1941 Today's Date: 03/04/2017   History of Present Illness  Pt is a 21 y/ofemalewith a PMH ofnonalcoholic steatohepatitis,DM,gastritis, esophageal varices.She presented to the emergency department secondary to 3-day history of worsening confusion.   Clinical Impression  Pt admitted with above diagnosis. Pt currently with functional limitations due to the deficits listed below (see PT Problem List). At the time of PT eval pt was able to perform transfers and ambulation with gross min guard assist to min assist for balance support and safety. Pt continues to be altered from baseline (daughter in room to confirm), and reports new onset of back pain s/p feeling a "pop" in her back while walking back from the bathroom right before PT session. Pt indicates the area to the L of her lower back and states it is tender to the touch with light palpation. RN notified. Pt will benefit from skilled PT to increase their independence and safety with mobility to allow discharge to the venue listed below.       Follow Up Recommendations Home health PT;Supervision/Assistance - 24 hour    Equipment Recommendations  None recommended by PT    Recommendations for Other Services       Precautions / Restrictions Precautions Precautions: Fall Restrictions Weight Bearing Restrictions: No      Mobility  Bed Mobility Overal bed mobility: Needs Assistance Bed Mobility: Supine to Sit     Supine to sit: HOB elevated;Min guard     General bed mobility comments: Increased time. Guarding at the trunk due to low back pain, however pt able to transition to EOB without assist.   Transfers Overall transfer level: Needs assistance Equipment used: Rolling walker (2 wheeled) Transfers: Sit to/from Stand Sit to Stand: Min guard         General transfer comment: Hands-on guarding for safety.    Ambulation/Gait Ambulation/Gait assistance: Min guard;Min assist Ambulation Distance (Feet): 75 Feet Assistive device: Rolling walker (2 wheeled) Gait Pattern/deviations: Step-through pattern;Decreased stride length;Trunk flexed Gait velocity: Decreased Gait velocity interpretation: Below normal speed for age/gender General Gait Details: Min guard throughout, with one instance of min assist required. Pt coughed and immediately doubled over in pain at her back, requiring assistance to recover and prevent fall.   Stairs            Wheelchair Mobility    Modified Rankin (Stroke Patients Only)       Balance Overall balance assessment: Needs assistance Sitting-balance support: Feet supported;No upper extremity supported Sitting balance-Leahy Scale: Fair     Standing balance support: Bilateral upper extremity supported;During functional activity Standing balance-Leahy Scale: Poor Standing balance comment: Reliant on RW for support                             Pertinent Vitals/Pain Pain Assessment: Faces Faces Pain Scale: Hurts even more Pain Location: Lower back to the L of spine. Pt reports she felt/heard a pop when she was walking back to the bed from bathroom prior to PT arrival and felt immediate pain.  Pain Descriptors / Indicators: Grimacing;Aching Pain Intervention(s): Limited activity within patient's tolerance;Monitored during session;Repositioned    Home Living Family/patient expects to be discharged to:: Private residence Living Arrangements: Children Available Help at Discharge: Family;Available PRN/intermittently(M-F 7a-430p) Type of Home: House Home Access: Stairs to enter Entrance Stairs-Rails: Right Entrance Stairs-Number of Steps: 10 Home Layout: Two level;Laundry or work area in basement  Home Equipment: Shower seat - built in;Walker - 2 wheels(Corner seats) Additional Comments: Daughter available during the day     Prior Function Level  of Independence: Needs assistance   Gait / Transfers Assistance Needed: Since Christmas, using the RW all the time. Someone there for showering for supervision but she was able to complete without assistance. Daughter doing cooking and household chores           Hand Dominance   Dominant Hand: Right    Extremity/Trunk Assessment   Upper Extremity Assessment Upper Extremity Assessment: Defer to OT evaluation    Lower Extremity Assessment Lower Extremity Assessment: Generalized weakness    Cervical / Trunk Assessment Cervical / Trunk Assessment: Kyphotic  Communication   Communication: No difficulties  Cognition Arousal/Alertness: Awake/alert Behavior During Therapy: WFL for tasks assessed/performed Overall Cognitive Status: Impaired/Different from baseline Area of Impairment: Orientation;Memory;Following commands;Safety/judgement;Awareness;Problem solving                     Memory: Decreased short-term memory Following Commands: Follows one step commands consistently;Follows one step commands with increased time;Follows multi-step commands inconsistently Safety/Judgement: Decreased awareness of safety;Decreased awareness of deficits Awareness: Emergent Problem Solving: Slow processing;Decreased initiation;Requires verbal cues        General Comments      Exercises     Assessment/Plan    PT Assessment Patient needs continued PT services  PT Problem List Decreased strength;Decreased range of motion;Decreased activity tolerance;Decreased balance;Decreased mobility;Decreased knowledge of use of DME;Decreased safety awareness;Decreased cognition;Decreased knowledge of precautions;Pain       PT Treatment Interventions DME instruction;Gait training;Stair training;Functional mobility training;Therapeutic activities;Therapeutic exercise;Neuromuscular re-education;Patient/family education    PT Goals (Current goals can be found in the Care Plan section)  Acute  Rehab PT Goals Patient Stated Goal: Improve back pain PT Goal Formulation: With patient/family Time For Goal Achievement: 03/18/17 Potential to Achieve Goals: Good    Frequency Min 3X/week   Barriers to discharge        Co-evaluation               AM-PAC PT "6 Clicks" Daily Activity  Outcome Measure Difficulty turning over in bed (including adjusting bedclothes, sheets and blankets)?: Unable Difficulty moving from lying on back to sitting on the side of the bed? : Unable Difficulty sitting down on and standing up from a chair with arms (e.g., wheelchair, bedside commode, etc,.)?: Unable Help needed moving to and from a bed to chair (including a wheelchair)?: A Little Help needed walking in hospital room?: A Little Help needed climbing 3-5 steps with a railing? : A Little 6 Click Score: 12    End of Session Equipment Utilized During Treatment: Gait belt Activity Tolerance: Patient limited by pain Patient left: in chair;with call bell/phone within reach;with family/visitor present Nurse Communication: Mobility status;Other (comment)(New onset back pain) PT Visit Diagnosis: Unsteadiness on feet (R26.81);Pain;Other abnormalities of gait and mobility (R26.89) Pain - Right/Left: Left Pain - part of body: (back)    Time: 6759-1638 PT Time Calculation (min) (ACUTE ONLY): 27 min   Charges:   PT Evaluation $PT Eval Moderate Complexity: 1 Mod PT Treatments $Gait Training: 8-22 mins   PT G Codes:        Rolinda Roan, PT, DPT Acute Rehabilitation Services Pager: [phone]   Thelma Comp 03/04/2017, 3:01 PM

## 2017-03-04 NOTE — Progress Notes (Signed)
PROGRESS NOTE    Cynthia Long  BWG:[id number] DOB: 1940/11/08 DOA: 03/03/2017 PCP: Macon Large, MD   Brief Narrative: Cynthia Long is a 77 y.o. female with medical history significant of female,with history ofNonalcoholic steatohepatitis,diabetes mellitus,gastritis, esophageal varices. She presented with hepatic encephalopathy. Currently treating with lactulose. US paracentesis pending with labs.   Assessment & Plan:   Active Problems:   HTN (hypertension)   Chronic allergic bronchitis   Hepatic cirrhosis (HCC)   Ascites   Uncontrolled type 2 diabetes mellitus , without long-term current use of insulin (HCC)   Liver cirrhosis secondary to NASH (HCC)   Hepatic encephalopathy (HCC)   Thrombocytopenia (HCC)   Hepatic encephalopathy Ammonia elevated at 77 now improved. Patient altered but alert. Three documented bowel movements yesterday -Lactulose TID; may need to convert to enema if mentation worsens. Titrate for 2-3 soft bowel movements per day. 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 Carlean Purl as an outpatient. 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 Patient has had repeat paracenteses for ascites. 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 No evidence of bleeding. Stable.  Chronic allergic bronchitis Chronic cough which is stable.  -Continue albuterol and Pulmicort  History of diabetes mellitus Patient is diet controlled. Last A1C of 5.4.  Abnormal chest x-ray Possibly secondary to fluid overload. Given doxycycline in ED. Chronic cough. -follow clinically -repeat chest x-ray   DVT prophylaxis: SCDs Code Status: DNR Family Communication: None at bedside Disposition Plan: When medically stable   Consultants:   None  Procedures:   Ultrasound paracentesis  Antimicrobials:  Doxycycline (03/03/17)     Subjective: Patient having trouble expressing concerns. Afebrile.  Objective: Vitals:   03/03/17 1900 03/03/17 2056 03/04/17 0435 03/04/17 0647  BP: 140/76 (!) 150/47  (!) 135/53  Pulse: 100 (!) 102  91  Resp: (!) 22 20  18   Temp:  98 F (36.7 C)  97.9 F (36.6 C)  TempSrc:  Oral  Oral  SpO2: 98% 99% 97% 94%  Weight:      Height:        Intake/Output Summary (Last 24 hours) at 03/04/2017 0858 Last data filed at 03/04/2017 0057 Gross per 24 hour  Intake 930 ml  Output 140 ml  Net 790 ml   Filed Weights   03/03/17 1041  Weight: 61.2 kg (135 lb)    Examination:  General exam: Appears calm and comfortable. Respiratory system: Clear to auscultation. Diminished. Respiratory effort normal. Cardiovascular system: S1 & S2 heard, RRR. 2/6 systolic murmur. Gastrointestinal system: Abdomen is significantly distended, soft and non-tender with reducible and non-tender umbilical hernia. Normal bowel sounds heard. Central nervous system: Alert and oriented to person and place. No focal neurological deficits. Extremities: 2+ edema. No calf tenderness Skin: No cyanosis. No rashes Psychiatry: Judgement and insight appear impaired. Mood & affect appropriate.     Data Reviewed: I have personally reviewed following labs and imaging studies  CBC: Recent Labs  Lab 03/03/17 1221 03/04/17 0552  WBC 7.2 7.4  NEUTROABS 5.8  --   HGB 13.2 11.7*  HCT 38.3 34.1*  MCV 93.2 92.4  PLT 63* 89*   Basic Metabolic Panel: Recent Labs  Lab 03/03/17 1221 03/04/17 0552  NA 133* 134*  K 3.8 3.4*  CL 102 103  CO2 20* 19*  GLUCOSE 153* 142*  BUN 17 18  CREATININE 1.00 0.90  CALCIUM 8.7* 8.3*   GFR:  Estimated Creatinine Clearance: 47.2 mL/min (by C-G formula based on SCr of 0.9 mg/dL). Liver Function Tests: Recent Labs  Lab 03/03/17 1221 03/04/17 0552  AST 53* 42*  ALT 30 25  ALKPHOS 215* 167*  BILITOT 6.5* 7.0*  PROT 6.0* 5.2*  ALBUMIN 2.3* 2.0*   No results for input(s):  LIPASE, AMYLASE in the last 168 hours. Recent Labs  Lab 03/03/17 1210 03/04/17 0718  AMMONIA 75* 45*   Coagulation Profile: Recent Labs  Lab 03/03/17 1221 03/04/17 0552  INR 1.18 1.32   Cardiac Enzymes: Recent Labs  Lab 03/03/17 1221  TROPONINI <0.03   BNP (last 3 results) No results for input(s): PROBNP in the last 8760 hours. HbA1C: No results for input(s): HGBA1C in the last 72 hours. CBG: Recent Labs  Lab 03/03/17 1837 03/03/17 2336  GLUCAP 175* 177*   Lipid Profile: No results for input(s): CHOL, HDL, LDLCALC, TRIG, CHOLHDL, LDLDIRECT in the last 72 hours. Thyroid Function Tests: No results for input(s): TSH, T4TOTAL, FREET4, T3FREE, THYROIDAB in the last 72 hours. Anemia Panel: No results for input(s): VITAMINB12, FOLATE, FERRITIN, TIBC, IRON, RETICCTPCT in the last 72 hours. Sepsis Labs: Recent Labs  Lab 03/03/17 1227 03/04/17 0718  LATICACIDVEN 2.88* 2.7*    No results found for this or any previous visit (from the past 240 hour(s)).       Radiology Studies: Dg Chest 2 View  Result Date: 03/03/2017 CLINICAL DATA:  Altered mental status EXAM: CHEST  2 VIEW COMPARISON:  January 14, 2017. FINDINGS: There is a the persistent moderate left pleural effusion. There is persistent consolidation throughout much of the left lower lobe and portions of the lingula. There is also new patchy consolidation in the right lower lobe. There is a minimal right pleural effusion. Lungs elsewhere are clear. Heart size and pulmonary vascularity are normal. No adenopathy. There is aortic atherosclerosis. No bone lesions. No adenopathy. A TIPS shunt is noted in the right upper quadrant. IMPRESSION: Pleural effusion on the left with left base consolidation, stable. New patchy airspace consolidation right base with minimal right pleural effusion. Stable cardiac silhouette. There is aortic atherosclerosis. Aortic Atherosclerosis (ICD10-I70.0). Electronically Signed   By: Lowella Grip III M.D.   On: 03/03/2017 11:40   US Liver Doppler  Result Date: 03/04/2017 CLINICAL DATA:  Status post TIPS in November.  Evaluate TIPS. EXAM: DUPLEX ULTRASOUND OF LIVER AND TIPS SHUNT TECHNIQUE: Color and duplex Doppler ultrasound was performed to evaluate the hepatic in-flow and out-flow vessels. COMPARISON:  Duplex ultrasound of liver and TIPS shunt performed 02/18/2017 FINDINGS: Portal Vein Velocities Main:  49.1 cm/sec, hepatopetal flow Right:  44.3 cm/sec, hepatopetal flow Left:  16.8 cm/sec, hepatofugal flow TIPS Stent Velocities Proximal:  32.2 cm/sec Mid:  80.2 cm/sec Distal:  85.8 cm/sec Hepatic Vein Velocities Right:  41.0 cm/sec Mid:  25.0 cm/sec Left:  25.8 cm/sec Splenic Vein:  24.1 cm/sec Superior Mesenteric Vein: Not visualized. Hepatic Artery:  101 cm/sec Ascites: A moderate to large amount of sonographically simple ascites is noted within the abdomen. Varices: Absent. Other findings: A small nodular liver is again noted, compatible with underlying history of cirrhosis. IMPRESSION: 1. Widely patent right hepatic to right portal venous TIPS. 2. Moderate to large volume ascites. Electronically Signed   By: Garald Balding M.D.   On: 03/04/2017 05:43        Scheduled Meds: . diltiazem  120 mg Oral Daily  . furosemide  20 mg Intravenous BID  . lactulose  20 g Oral  TID  . lidocaine (PF)      . sodium chloride flush  3 mL Intravenous Q12H  . spironolactone  50 mg Oral Daily   Continuous Infusions: . sodium chloride       LOS: 1 day     Cordelia Poche, MD [facility] 03/04/2017, 8:58 AM Pager: [phone]  If 7PM-7AM, please contact night-coverage [email or web address] Password [email or web address] 03/04/2017, 8:58 AM

## 2017-03-05 ENCOUNTER — Inpatient Hospital Stay (HOSPITAL_COMMUNITY): Payer: Medicare Other

## 2017-03-05 ENCOUNTER — Other Ambulatory Visit: Payer: Self-pay

## 2017-03-05 ENCOUNTER — Encounter (HOSPITAL_COMMUNITY): Payer: Self-pay | Admitting: General Practice

## 2017-03-05 DIAGNOSIS — E1100 Type 2 diabetes mellitus with hyperosmolarity without nonketotic hyperglycemic-hyperosmolar coma (NKHHC): Secondary | ICD-10-CM

## 2017-03-05 DIAGNOSIS — J45998 Other asthma: Secondary | ICD-10-CM

## 2017-03-05 LAB — CBC
HCT: 31.3 % — ABNORMAL LOW (ref 36.0–46.0)
Hemoglobin: 10.5 g/dL — ABNORMAL LOW (ref 12.0–15.0)
MCH: 31.3 pg (ref 26.0–34.0)
MCHC: 33.5 g/dL (ref 30.0–36.0)
MCV: 93.2 fL (ref 78.0–100.0)
PLATELETS: 86 10*3/uL — AB (ref 150–400)
RBC: 3.36 MIL/uL — ABNORMAL LOW (ref 3.87–5.11)
RDW: 15 % (ref 11.5–15.5)
WBC: 7.1 10*3/uL (ref 4.0–10.5)

## 2017-03-05 LAB — COMPREHENSIVE METABOLIC PANEL
ALT: 23 U/L (ref 14–54)
AST: 39 U/L (ref 15–41)
Albumin: 1.7 g/dL — ABNORMAL LOW (ref 3.5–5.0)
Alkaline Phosphatase: 146 U/L — ABNORMAL HIGH (ref 38–126)
Anion gap: 7 (ref 5–15)
BILIRUBIN TOTAL: 5.6 mg/dL — AB (ref 0.3–1.2)
BUN: 17 mg/dL (ref 6–20)
CHLORIDE: 104 mmol/L (ref 101–111)
CO2: 21 mmol/L — ABNORMAL LOW (ref 22–32)
Calcium: 8 mg/dL — ABNORMAL LOW (ref 8.9–10.3)
Creatinine, Ser: 0.9 mg/dL (ref 0.44–1.00)
GFR calc Af Amer: >60 mL/min (ref >60)
Glucose, Bld: 206 mg/dL — ABNORMAL HIGH (ref 65–99)
POTASSIUM: 3.2 mmol/L — AB (ref 3.5–5.1)
Sodium: 132 mmol/L — ABNORMAL LOW (ref 135–145)
TOTAL PROTEIN: 4.6 g/dL — AB (ref 6.5–8.1)

## 2017-03-05 LAB — GLUCOSE, CAPILLARY
GLUCOSE-CAPILLARY: 223 mg/dL — AB (ref 65–99)
GLUCOSE-CAPILLARY: 153 mg/dL — AB (ref 65–99)
Glucose-Capillary: 197 mg/dL — ABNORMAL HIGH (ref 65–99)
Glucose-Capillary: 192 mg/dL — ABNORMAL HIGH (ref 65–99)

## 2017-03-05 LAB — URINE CULTURE

## 2017-03-05 LAB — AMYLASE, PLEURAL OR PERITONEAL FLUID: AMYLASE FL: 17 U/L

## 2017-03-05 LAB — LACTIC ACID, PLASMA: Lactic Acid, Venous: 2.5 mmol/L (ref 0.5–1.9)

## 2017-03-05 MED ORDER — INSULIN ASPART 100 UNIT/ML ~~LOC~~ SOLN
0.0000 [IU] | Freq: Three times a day (TID) | SUBCUTANEOUS | Status: DC
Start: 2017-03-05 — End: 2017-03-07
  Administered 2017-03-05 (×2): 2 [IU] via SUBCUTANEOUS
  Administered 2017-03-05 – 2017-03-06 (×2): 3 [IU] via SUBCUTANEOUS
  Administered 2017-03-06: 2 [IU] via SUBCUTANEOUS
  Administered 2017-03-06 – 2017-03-07 (×2): 3 [IU] via SUBCUTANEOUS
  Administered 2017-03-07: 2 [IU] via SUBCUTANEOUS

## 2017-03-05 MED ORDER — INFLUENZA VAC SPLIT HIGH-DOSE 0.5 ML IM SUSY
0.5000 mL | PREFILLED_SYRINGE | INTRAMUSCULAR | Status: DC
  Filled 2017-03-05: qty 0.5

## 2017-03-05 MED ORDER — ACETAMINOPHEN 325 MG PO TABS
650.0000 mg | ORAL_TABLET | Freq: Two times a day (BID) | ORAL | Status: DC | PRN
  Administered 2017-03-05 – 2017-03-11 (×6): 650 mg via ORAL
  Filled 2017-03-05 (×6): qty 2

## 2017-03-05 MED ORDER — POTASSIUM CHLORIDE CRYS ER 20 MEQ PO TBCR
40.0000 meq | EXTENDED_RELEASE_TABLET | ORAL | Status: AC
  Administered 2017-03-05 (×2): 40 meq via ORAL
  Filled 2017-03-05 (×2): qty 2

## 2017-03-05 NOTE — Discharge Summary (Addendum)
Physician Discharge Summary  Cynthia Long TDV:[id number] DOB: [birth year] DOA: 03/03/2017  PCP: Macon Large, MD  Admit date: 03/03/2017 Discharge date: 03/05/2017  Admitted From: Home Disposition: Home  Recommendations for Outpatient Follow-up:  1. Follow up with PCP in 1 week 2. Please obtain BMP/CBC in one week 3. Please follow up on the following pending results: Blood/urine culture.  Home Health: Physical therapy Equipment/Devices: None  Discharge Condition: Stable CODE STATUS: DNR Diet recommendation: Fluid restricted.   Brief/Interim Summary:  Chief Complaint: Confusion  HPI: Cynthia Long is a 77 y.o. female with medical history significant of female,with history ofNonalcoholic steatohepatitis,diabetes mellitus,gastritis, esophageal varices.  She presented to the emergency department secondary to 3-day history of worsening confusion.  Patient was not oriented enough to provide a history.  History provided by her daughter.  Per daughter patient has been taking her lactulose, spironolactone, and Lasix as prescribed.  It is unknown if patient has been having any bowel movements in the last few days but her daughter feels like she has not.  She reports seeing some urine today that was dark and brown appearing.  Lactulose is not improved her mentation.  She last received a paracentesis on December 18 with some mild cramping afterwards.  This has resolved.  ED Course: Vitals: Afebrile, normal pulse, slightly hypertensive, on room air Labs: Sodium 133, CO2 of 20, Calcium of 8.7, alk phos of 215, albumin 2.3, AST 53, bilirubin 6.5, lactic acid 2.88, platelets of 63 Imaging: Stable pleural effusion w/ left base consolidation and new patchy airspace consolidation Medications/Course: Lactulose, doxycycline, 500 mL NS bolus    Hospital course:  Hepatic encephalopathy Patient restarted on home dose of lactulose three times daily. Ammonia elevated at 75 with improvement to 45 and  resolution of encephalopathy. Continue previous regimen on discharge.  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 Carlean Purl as an outpatient. 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 Patient has had repeat paracenteses for ascites. Ultrasound liver doppler shows a patent shunt.  Patient underwent ultrasound paracentesis which was significant for 5.3 L of clear yellow fluid.  No evidence of SBP on fluid analysis.  No organisms seen on Gram stain.  Continue home spironolactone and added Lasix 20 mg IV while inpatient.  Resume home Lasix at discharge.  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 No evidence of bleeding. Stable.  Chronic allergic bronchitis Chronic cough which is stable. Continued home albuterol and Pulmicort  History of diabetes mellitus Patient is diet controlled. Last A1C of 5.4. Sliding scale insulin  Abnormal chest x-ray Possibly secondary to fluid overload. Given doxycycline in ED. Chronic cough. Treated with increased Lasix.  Lactic acidosis No evidence of infection. In setting of significant hepatic disease.    Discharge Diagnoses:  Active Problems:   HTN (hypertension)   Chronic allergic bronchitis   Hepatic cirrhosis (HCC)   Ascites   Uncontrolled type 2 diabetes mellitus , without long-term current use of insulin (HCC)   Liver cirrhosis secondary to NASH [facility])   Hepatic encephalopathy ([place])   Thrombocytopenia ([place])    Discharge Instructions  Discharge Instructions    Diet - low sodium heart healthy   Complete by:  As directed    Increase activity slowly   Complete by:  As directed      Allergies as of 03/05/2017      Reactions   Biaxin [clarithromycin]    Liver enzymes elevated per pt   Sulfa Antibiotics Diarrhea      Medication List  STOP taking these medications   diltiazem 120 MG 24 hr capsule Commonly known as:  DILACOR XR     TAKE these medications   acetaminophen 325 MG tablet Commonly known as:   TYLENOL Take 325 mg by mouth every 6 (six) hours as needed for mild pain or headache.   budesonide 0.5 MG/2ML nebulizer solution Commonly known as:  PULMICORT Take 0.5 mg by nebulization daily as needed (for shortness of breath).   diltiazem 120 MG 24 hr capsule Commonly known as:  CARDIZEM CD Take 1 capsule daily by mouth.   furosemide 40 MG tablet Commonly known as:  LASIX Take 0.5 tablets (20 mg total) daily by mouth.   lactulose 10 GM/15ML solution Commonly known as:  CHRONULAC Take 30 mLs by mouth 3 (three) times daily.   ondansetron 4 MG disintegrating tablet Commonly known as:  ZOFRAN ODT Take 1 tablet (4 mg total) every 6 (six) hours as needed by mouth for nausea or vomiting.   onetouch ultrasoft lancets 1 each by Other route 2 (two) times daily.   spironolactone 50 MG tablet Commonly known as:  ALDACTONE Take 50 mg by mouth daily.   VENTOLIN HFA 108 (90 Base) MCG/ACT inhaler Generic drug:  albuterol Inhale 2 puffs into the lungs every 6 (six) hours as needed for wheezing or shortness of breath.      Follow-up Information    Ahmed, Otila Back, MD. Schedule an appointment as soon as possible for a visit in 1 week(s).   Specialty:  Internal Medicine Contact information: [address] [place] [address]          Allergies  Allergen Reactions  . Biaxin [Clarithromycin]     Liver enzymes elevated per pt  . Sulfa Antibiotics Diarrhea    Consultations:  None   Procedures/Studies: Dg Chest 1 View  Result Date: 03/04/2017 CLINICAL DATA:  77 year old female with pleural effusion. Status post paracentesis EXAM: CHEST 1 VIEW COMPARISON:  03/03/2017 FINDINGS: Cardiomediastinal silhouette unchanged. Partial obscuration of the left heart border and the left hemidiaphragm. Hazy opacities at the lower lungs. Pleuroparenchymal thickening on the left. No pneumothorax. Calcifications of the aortic arch. TIPS stent of the upper abdomen again noted. IMPRESSION:  Similar appearance of the chest x-ray with left greater than right pleural effusion and associated atelectasis/ consolidation. Electronically Signed   By: Corrie Mckusick D.O.   On: 03/04/2017 10:24   Dg Chest 2 View  Result Date: 03/03/2017 CLINICAL DATA:  Altered mental status EXAM: CHEST  2 VIEW COMPARISON:  January 14, 2017. FINDINGS: There is a the persistent moderate left pleural effusion. There is persistent consolidation throughout much of the left lower lobe and portions of the lingula. There is also new patchy consolidation in the right lower lobe. There is a minimal right pleural effusion. Lungs elsewhere are clear. Heart size and pulmonary vascularity are normal. No adenopathy. There is aortic atherosclerosis. No bone lesions. No adenopathy. A TIPS shunt is noted in the right upper quadrant. IMPRESSION: Pleural effusion on the left with left base consolidation, stable. New patchy airspace consolidation right base with minimal right pleural effusion. Stable cardiac silhouette. There is aortic atherosclerosis. Aortic Atherosclerosis (ICD10-I70.0). Electronically Signed   By: Lowella Grip III M.D.   On: 03/03/2017 11:40   US Paracentesis  Result Date: 03/04/2017 INDICATION: History of NASH cirrhosis with recurrent abdominal ascites. Status post TIPS on January 06, 2017. Request for diagnostic and therapeutic paracentesis. EXAM: ULTRASOUND GUIDED RIGHT LATERAL ABDOMEN PARACENTESIS MEDICATIONS: 1% Lidocaine =  15 mL COMPLICATIONS: None immediate. PROCEDURE: Informed written consent was obtained from the patient after a discussion of the risks, benefits and alternatives to treatment. A timeout was performed prior to the initiation of the procedure. Initial ultrasound scanning demonstrates a large amount of ascites within the right lateral abdomen. The right lateral abdomen was prepped and draped in the usual sterile fashion. 1% lidocaine with epinephrine was used for local anesthesia. Following  this, a 19 gauge, 7-cm, Yueh catheter was introduced. An ultrasound image was saved for documentation purposes. The paracentesis was performed. The catheter was removed and a dressing was applied. The patient tolerated the procedure well without immediate post procedural complication. FINDINGS: A total of approximately 5.3 liters of clear yellow fluid was removed. Samples were sent to the laboratory as requested by the clinical team. IMPRESSION: Successful ultrasound-guided paracentesis yielding 5.3 liters of peritoneal fluid. Read by:  Gareth Eagle, PA-C Electronically Signed   By: Marybelle Killings M.D.   On: 03/04/2017 09:54   US Paracentesis  Result Date: 02/20/2017 INDICATION: Cirrhosis likely due to NASH EXAM: ULTRASOUND GUIDED THERAPEUTIC PARACENTESIS MEDICATIONS: None. COMPLICATIONS: None immediate. PROCEDURE: Procedure, benefits, and risks of procedure were discussed with patient. Written informed consent for procedure was obtained. Time out protocol followed. Adequate collection of ascites localized by ultrasound in RIGHT lower quadrant. Skin prepped and draped in usual sterile fashion. Skin and soft tissues anesthetized with 10 mL of 1% lidocaine. 5 [place] Yueh catheter placed into peritoneal cavity. 6 L of bright yellow ascitic fluid aspirated by vacuum bottle suction. Procedure tolerated well by patient without immediate complication. FINDINGS: As above IMPRESSION: Successful ultrasound-guided paracentesis yielding 6 liters of peritoneal fluid. Electronically Signed   By: Lavonia Dana M.D.   On: 02/20/2017 13:52   US Abdominal Pelvic Art/vent Flow Doppler  Result Date: 02/18/2017 CLINICAL DATA:  77 year old female with NASH cirrhosis complicated by medically refractory large volume ascites. She underwent creation of a TIPS on 01/06/2017 and presents today for scheduled follow-up. EXAM: DUPLEX ULTRASOUND OF LIVER AND TIPS SHUNT TECHNIQUE: Color and duplex Doppler ultrasound was performed to evaluate  the hepatic in-flow and out-flow vessels. COMPARISON:  Most recent prior TIPS ultrasound 01/15/2017 FINDINGS: Portal Vein Velocities Main:  43 cm/sec, hepatopetal flow Right:  86 cm/sec, hepatopetal flow Left:  14 cm/sec, hepatofugal flow TIPS Stent Velocities Proximal:  86 cm/sec Mid:  148 Distal:  84 cm/sec Hepatic Vein Velocities Right:  84 cm/sec Mid:  16 cm/sec Left:  Unable to visualize. Splenic Vein: 38 centimeters/second Superior Mesenteric Vein: Not visualized Hepatic Artery: 82 centimeters/second Ascites: Moderate sonographically simple ascites. Varices: Absent. Other findings: Small, nodular liver consistent with the underlying history of cirrhosis. IMPRESSION: 1. Widely patent right hepatic to right portal venous TIPS. Reversed flow in the left hepatic vein is not unexpected. 2. Moderate ascites. Signed, Criselda Peaches, MD [facility] Electronically Signed   By: Jacqulynn Cadet M.D.   On: 02/18/2017 12:11   US Liver Doppler  Result Date: 03/04/2017 CLINICAL DATA:  Status post TIPS in November.  Evaluate TIPS. EXAM: DUPLEX ULTRASOUND OF LIVER AND TIPS SHUNT TECHNIQUE: Color and duplex Doppler ultrasound was performed to evaluate the hepatic in-flow and out-flow vessels. COMPARISON:  Duplex ultrasound of liver and TIPS shunt performed 02/18/2017 FINDINGS: Portal Vein Velocities Main:  49.1 cm/sec, hepatopetal flow Right:  44.3 cm/sec, hepatopetal flow Left:  16.8 cm/sec, hepatofugal flow TIPS Stent Velocities Proximal:  32.2 cm/sec Mid:  80.2 cm/sec Distal:  85.8  cm/sec Hepatic Vein Velocities Right:  41.0 cm/sec Mid:  25.0 cm/sec Left:  25.8 cm/sec Splenic Vein:  24.1 cm/sec Superior Mesenteric Vein: Not visualized. Hepatic Artery:  101 cm/sec Ascites: A moderate to large amount of sonographically simple ascites is noted within the abdomen. Varices: Absent. Other findings: A small nodular liver is again noted, compatible with underlying  history of cirrhosis. IMPRESSION: 1. Widely patent right hepatic to right portal venous TIPS. 2. Moderate to large volume ascites. Electronically Signed   By: Garald Balding M.D.   On: 03/04/2017 05:43   Ir Radiologist Eval & Mgmt  Result Date: 02/18/2017 Please refer to notes tab for details about interventional procedure. (Op Note)     Subjective: No abdominal pain.  Discharge Exam: Vitals:   03/05/17 0500 03/05/17 0906  BP: 127/65 (!) 112/42  Pulse: 62   Resp: 15   Temp: (!) 97.5 F (36.4 C)   SpO2: (!) 89%    Vitals:   03/04/17 1404 03/04/17 1900 03/05/17 0500 03/05/17 0906  BP: (!) 128/44 (!) 147/78 127/65 (!) 112/42  Pulse: 81 84 62   Resp: 18 18 15    Temp: (!) 95.9 F (35.5 C) 98.2 F (36.8 C) (!) 97.5 F (36.4 C)   TempSrc: Oral Oral Oral   SpO2: 96% 97% (!) 89%   Weight:      Height:        General: Pt is alert, awake, not in acute distress Cardiovascular: RRR, S1/S2 +, no rubs, no gallops Respiratory: CTA bilaterally, diminished, no wheezing, no rhonchi Abdominal: Soft, NT, distended, bowel sounds + Extremities: 2+ edema, no cyanosis    The results of significant diagnostics from this hospitalization (including imaging, microbiology, ancillary and laboratory) are listed below for reference.     Microbiology: Recent Results (from the past 240 hour(s))  Culture, blood (Routine x 2)     Status: None (Preliminary result)   Collection Time: 03/03/17 11:10 AM  Result Value Ref Range Status   Specimen Description BLOOD RIGHT HAND  Final   Special Requests IN PEDIATRIC BOTTLE Blood Culture adequate volume  Final   Culture NO GROWTH 1 DAY  Final   Report Status PENDING  Incomplete  Culture, blood (Routine x 2)     Status: None (Preliminary result)   Collection Time: 03/03/17 12:21 PM  Result Value Ref Range Status   Specimen Description BLOOD LEFT HAND  Final   Special Requests IN PEDIATRIC BOTTLE Blood Culture adequate volume  Final   Culture NO GROWTH  1 DAY  Final   Report Status PENDING  Incomplete  Gram stain     Status: None   Collection Time: 03/04/17 10:00 AM  Result Value Ref Range Status   Specimen Description PERITONEAL  Final   Special Requests NONE  Final   Gram Stain   Final    RARE WBC PRESENT, PREDOMINANTLY MONONUCLEAR NO ORGANISMS SEEN    Report Status 03/04/2017 FINAL  Final     Labs: BNP (last 3 results) Recent Labs    03/03/17 2136  BNP 161.0*   Basic Metabolic Panel: Recent Labs  Lab 03/03/17 1221 03/04/17 0552 03/05/17 0520  NA 133* 134* 132*  K 3.8 3.4* 3.2*  CL 102 103 104  CO2 20* 19* 21*  GLUCOSE 153* 142* 206*  BUN 17 18 17   CREATININE 1.00 0.90 0.90  CALCIUM 8.7* 8.3* 8.0*   Liver Function Tests: Recent Labs  Lab 03/03/17 1221 03/04/17 0552 03/05/17 0520  AST 53* 42* 39  ALT 30 25 23   ALKPHOS 215* 167* 146*  BILITOT 6.5* 7.0* 5.6*  PROT 6.0* 5.2* 4.6*  ALBUMIN 2.3* 2.0* 1.7*   No results for input(s): LIPASE, AMYLASE in the last 168 hours. Recent Labs  Lab 03/03/17 1210 03/04/17 0718  AMMONIA 75* 45*   CBC: Recent Labs  Lab 03/03/17 1221 03/04/17 0552 03/05/17 0520  WBC 7.2 7.4 7.1  NEUTROABS 5.8  --   --   HGB 13.2 11.7* 10.5*  HCT 38.3 34.1* 31.3*  MCV 93.2 92.4 93.2  PLT 63* 89* 86*   Cardiac Enzymes: Recent Labs  Lab 03/03/17 1221  TROPONINI <0.03   BNP: Invalid input(s): POCBNP CBG: Recent Labs  Lab 03/03/17 1837 03/03/17 2336 03/05/17 0903  GLUCAP 175* 177* 223*   D-Dimer No results for input(s): DDIMER in the last 72 hours. Hgb A1c No results for input(s): HGBA1C in the last 72 hours. Lipid Profile No results for input(s): CHOL, HDL, LDLCALC, TRIG, CHOLHDL, LDLDIRECT in the last 72 hours. Thyroid function studies No results for input(s): TSH, T4TOTAL, T3FREE, THYROIDAB in the last 72 hours.  Invalid input(s): FREET3 Anemia work up No results for input(s): VITAMINB12, FOLATE, FERRITIN, TIBC, IRON, RETICCTPCT in the last 72  hours. Urinalysis    Component Value Date/Time   COLORURINE AMBER (A) 03/03/2017 1844   APPEARANCEUR CLEAR 03/03/2017 1844   LABSPEC 1.025 03/03/2017 1844   PHURINE 5.0 03/03/2017 1844   GLUCOSEU NEGATIVE 03/03/2017 1844   HGBUR NEGATIVE 03/03/2017 1844   BILIRUBINUR NEGATIVE 03/03/2017 1844   KETONESUR NEGATIVE 03/03/2017 1844   PROTEINUR NEGATIVE 03/03/2017 1844   UROBILINOGEN 0.2 10/08/2013 1746   NITRITE NEGATIVE 03/03/2017 1844   LEUKOCYTESUR TRACE (A) 03/03/2017 1844   Sepsis Labs Invalid input(s): PROCALCITONIN,  WBC,  LACTICIDVEN Microbiology Recent Results (from the past 240 hour(s))  Culture, blood (Routine x 2)     Status: None (Preliminary result)   Collection Time: 03/03/17 11:10 AM  Result Value Ref Range Status   Specimen Description BLOOD RIGHT HAND  Final   Special Requests IN PEDIATRIC BOTTLE Blood Culture adequate volume  Final   Culture NO GROWTH 1 DAY  Final   Report Status PENDING  Incomplete  Culture, blood (Routine x 2)     Status: None (Preliminary result)   Collection Time: 03/03/17 12:21 PM  Result Value Ref Range Status   Specimen Description BLOOD LEFT HAND  Final   Special Requests IN PEDIATRIC BOTTLE Blood Culture adequate volume  Final   Culture NO GROWTH 1 DAY  Final   Report Status PENDING  Incomplete  Gram stain     Status: None   Collection Time: 03/04/17 10:00 AM  Result Value Ref Range Status   Specimen Description PERITONEAL  Final   Special Requests NONE  Final   Gram Stain   Final    RARE WBC PRESENT, PREDOMINANTLY MONONUCLEAR NO ORGANISMS SEEN    Report Status 03/04/2017 FINAL  Final     Time coordinating discharge: Over 30 minutes  SIGNED:   Cordelia Poche, MD [facility] 03/05/2017, 10:15 AM Pager [phone]  If 7PM-7AM, please contact night-coverage [email or web address] Password [email or web address]

## 2017-03-05 NOTE — Progress Notes (Signed)
Patient's family member reported to the RN that they are concerned regarding back pain that the patient experienced after PT.  Patient's family is concerned regarding possible discharge to home

## 2017-03-05 NOTE — Care Management Note (Addendum)
Case Management Note  Patient Details  Name: Cynthia Long MRN: [id number] Date of Birth: Jun 21, 1940  Subjective/Objective:   77 yr old female admitted with hepatic encephalopathy.                 Action/Plan: Case manager has spoken with patient and her daughter [name], and brother in law [name] concerning discharge plan. Patient says she has been active with [facility] in [place], [place]. CM confirmed with daughter [name]. CM contacted [facility] concerning resumption of services. Orders and pertinient information was faxed to [phone]. ** Case manager was notified that patient and family want another [place]. Referral has been called to [facility], CM has faxed orders, F2F, demographics, OP note to them at     Expected Discharge Date:  03/05/17               Expected Discharge Plan:  [place]  In-House Referral:  NA  Discharge planning Services  CM Consult  Post Acute Care Choice:  Home Health Choice offered to:  Patient, Adult Children  DME Arranged:  (Has RW) DME Agency:  NA  HH Arranged:  PT, Nurses aide  [place] Agency:  [facility]  Status of Service:  Completed, signed off  If discussed at [place] of Stay Meetings, dates discussed:    Additional Comments:  [name], RN 03/05/2017, 11:29 AM

## 2017-03-05 NOTE — Progress Notes (Signed)
CRITICAL VALUE ALERT  Critical Value:  Lactic Acid 2.5  Date & Time Notied: 03/05/2017 & 0700  Provider Notified: Paged Dr Teryl Lucy  Orders Received/Actions taken: Waiting for Orders

## 2017-03-05 NOTE — Discharge Instructions (Signed)
Hepatic Encephalopathy °Hepatic encephalopathy is a loss of brain function from advanced liver disease. The effects of the condition depend on the type of liver damage and how severe it is. In some cases, hepatic encephalopathy can be reversed. °What are the causes? °The exact cause of hepatic encephalopathy is not known. °What increases the risk? °You have a higher risk of getting this condition if your liver is damaged. When the liver is damaged harmful substances called toxins can build up in the body. Certain toxins, such as ammonia, can harm your brain. Conditions that can cause liver damage include: °· An infection. °· Dehydration. °· Intestinal bleeding. °· Drinking too much alcohol. °· Taking certain medicines, including tranquilizers, water pills (diuretics), antidepressants, or sleeping pills. ° °What are the signs or symptoms? °Signs and symptoms may develop suddenly. Or, they may develop slowly and get worse gradually. Symptoms can range from mild to severe. °Mild Hepatic Encephalopathy °· Mild confusion. °· Personality and mood changes. °· Anxiety and agitation. °· Drowsiness. °· Loss of mental abilities. °· Musty or sweet-smelling breath. °Worsening or Severe Hepatic Encephalopathy °· Slowed movement. °· Slurred speech. °· Extreme personality changes. °· Disorientation. °· Abnormal shaking or flapping of the hands. °· Coma. °How is this diagnosed? °To make a diagnosis, your health care provider will do a physical exam. To rule out other causes of your signs and symptoms, he or she may order tests. You may have: °· Blood tests. These may be done to check your ammonia level, measure how long it takes your blood to clot, and check for infection. °· Liver function tests. These may be done to check how well your liver is working. °· MRI and CT scans. These may be done to check for a brain disorder. °· Electroencephalogram (EEG). This may be done to measure the electrical activity in your brain. ° °How is  this treated? °The first step in treatment is identifying and treating possible triggers. The next step is involves taking medicine to lower the level of toxins in the body and to prevent ammonia from building up. You may need to take: °· Antibiotics to reduce the ammonia-producing bacteria in your gut. °· Lactulose to help flush ammonia from the gut. ° °Follow these instructions at home: °Eating and drinking °· Follow a low-protein diet that includes plenty of fruits, vegetables, and whole grains, as directed by your health care provider. Ammonia is produced when you digest high-protein foods. °· Work with a dietitian or with your health care provider to make sure you are getting the right balance of protein and minerals. °· Drink enough fluids to keep your urine clear or pale yellow. Drinking plenty of water helps prevent constipation. °· Do not drink alcohol or use illegal drugs. °Medicines °· Only take medicine as directed by your health care provider. °· If you were prescribed an antibiotic medicine, finish it all even if you start to feel better. °· Do not start any new medicines, including over-the-counter medicines, without first checking with your health care provider. °Contact a health care provider if: °· You have new symptoms. °· Your symptoms change. °· Your symptoms get worse. °· You have a fever. °· You are constipated. °· You have persistent nausea, vomiting, or diarrhea. °Get help right away if: °· You become very confused or drowsy. °· You vomit blood or material that looks like coffee grounds. °· Your stool is bloody or black or looks like tar. °This information is not intended to replace advice given to   you by your health care provider. Make sure you discuss any questions you have with your health care provider. °Document Released: 04/30/2006 Document Revised: 07/27/2015 Document Reviewed: 10/06/2013 °Elsevier Interactive Patient Education © 2018 [employer] ° °

## 2017-03-05 NOTE — Progress Notes (Signed)
Dr Lonny Prude has been paged regarding patient's family member requesting a "mild" pain medication for back pain and family member awaiting a return call back from previous page attempt

## 2017-03-06 ENCOUNTER — Inpatient Hospital Stay (HOSPITAL_COMMUNITY): Payer: Medicare Other

## 2017-03-06 LAB — GLUCOSE, CAPILLARY
GLUCOSE-CAPILLARY: 196 mg/dL — AB (ref 65–99)
GLUCOSE-CAPILLARY: 253 mg/dL — AB (ref 65–99)
GLUCOSE-CAPILLARY: 231 mg/dL — AB (ref 65–99)
Glucose-Capillary: 226 mg/dL — ABNORMAL HIGH (ref 65–99)

## 2017-03-06 MED ORDER — CALCITONIN (SALMON) 200 UNIT/ACT NA SOLN
1.0000 | Freq: Every day | NASAL | Status: DC
  Administered 2017-03-06 – 2017-03-13 (×6): 1 via NASAL
  Filled 2017-03-06 (×2): qty 3.7

## 2017-03-06 MED ORDER — DIAZEPAM 5 MG PO TABS
5.0000 mg | ORAL_TABLET | Freq: Three times a day (TID) | ORAL | Status: DC | PRN
Start: 2017-03-06 — End: 2017-03-07
  Administered 2017-03-06 – 2017-03-07 (×2): 5 mg via ORAL
  Filled 2017-03-06 (×3): qty 1

## 2017-03-06 MED ORDER — MORPHINE SULFATE (PF) 2 MG/ML IV SOLN
2.0000 mg | INTRAVENOUS | Status: DC | PRN
  Administered 2017-03-06: 2 mg via INTRAVENOUS
  Filled 2017-03-06: qty 1

## 2017-03-06 NOTE — Consult Note (Signed)
Cynthia Long, Otila Back, MD Chief Complaint: Acute severe low back pain History: This is a pleasant 77 year old woman who was admitted earlier this week because of elevated ammonia levels and altered mental status.  Patient started to improve and then was mobilized Tuesday with physical therapy.  Patient states that she felt and heard a pop and has had appreciating lumbar spine pain ever since.  Yesterday she attempted to ambulate with physical therapy but felt so much pain in her back that her knees started to buckle and she felt as though she was in a fall.  Today she continues to have significant pain but she was able to ambulate without feeling unstable.  As a result of the severe pain I was consulted for evaluation.  Past Medical History:  Diagnosis Date  . Allergy   . Anemia   . ARF (acute renal failure) ([place]) 2015   ?prerenal azotemia- resolved by discharge  . Ascites 03/2017  . Blood transfusion without reported diagnosis   . Bronchitis, acute   . Cholelithiasis   . 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 Diabetes mellitus without complication ([place])   . Dyspnea    when fluid builds up in abdomen  . Dysrhythmia   . Esophageal varices ([place])   . Gastritis   . GI bleed 08/2013, 10/2013   EGD negative 08/2013.   Marland Kitchen Headache(784.0)   . Heart murmur   . Hypertension   . Liver cirrhosis ([place])   . Obesity   . Pertussis 2011  . Tubular adenoma of colon   . Uncontrolled type 2 diabetes mellitus , without long-term current use of insulin ([place]) 11/15/2016    Allergies  Allergen Reactions  . Biaxin [Clarithromycin]     Liver enzymes elevated per pt  . Sulfa Antibiotics Diarrhea    No current facility-administered medications on file prior to encounter.    Current Outpatient Medications on File Prior to Encounter  Medication Sig Dispense Refill  . acetaminophen (TYLENOL) 325 MG tablet Take 325 mg by mouth every 6 (six) hours as needed for mild pain or headache.    . budesonide  (PULMICORT) 0.5 MG/2ML nebulizer solution Take 0.5 mg by nebulization daily as needed (for shortness of breath).    . furosemide (LASIX) 40 MG tablet Take 0.5 tablets (20 mg total) daily by mouth.    . lactulose (CHRONULAC) 10 GM/15ML solution Take 30 mLs by mouth 3 (three) times daily.   0  . Lancets (ONETOUCH ULTRASOFT) lancets 1 each by Other route 2 (two) times daily.   6  . ondansetron (ZOFRAN ODT) 4 MG disintegrating tablet Take 1 tablet (4 mg total) every 6 (six) hours as needed by mouth for nausea or vomiting. 30 tablet 0  . spironolactone (ALDACTONE) 50 MG tablet Take 50 mg by mouth daily.     . VENTOLIN HFA 108 (90 Base) MCG/ACT inhaler Inhale 2 puffs into the lungs every 6 (six) hours as needed for wheezing or shortness of breath.   6  . diltiazem (CARDIZEM CD) 120 MG 24 hr capsule Take 1 capsule daily by mouth.    . diltiazem (DILACOR XR) 120 MG 24 hr capsule Take 120 mg by mouth daily.      Physical Exam: Vitals:   03/06/17 0858 03/06/17 1300  BP: (!) 132/52 (!) 122/47  Pulse: 63 72  Resp:  16  Temp:  97.7 F (36.5 C)  SpO2:  98%  She is currently alert and oriented x3.  No  shortness of breath or chest pain.  Abdomen is soft and nontender.  She denies incontinence of bowel and bladder.  No significant hip, knee, ankle pain with isolated joint range of motion.  Negative Babinski test 1+ symmetrical deep tendon reflexes at the knee, and the Achilles.  Negative straight leg raise test.  Compartments are soft and nontender in the lower extremity 1+ symmetrical pulses in the lower extremity.  No obvious skin lesions abrasions or contusions to lumbar spine.  Significant midline mid to upper lumbar pain with direct palpation.  It radiates into into the paraspinal muscle but does not go into the lower extremities.  Patient has no numbness or dysesthesias into the lower extremity.  She has 5 out of 5 strength in the lower extremity.  Muscle testing was done with her laying in  bed.    Image: Dg Chest 1 View  Result Date: 03/04/2017 CLINICAL DATA:  77 year old female with pleural effusion. Status post paracentesis EXAM: CHEST 1 VIEW COMPARISON:  03/03/2017 FINDINGS: Cardiomediastinal silhouette unchanged. Partial obscuration of the left heart border and the left hemidiaphragm. Hazy opacities at the lower lungs. Pleuroparenchymal thickening on the left. No pneumothorax. Calcifications of the aortic arch. TIPS stent of the upper abdomen again noted. IMPRESSION: Similar appearance of the chest x-ray with left greater than right pleural effusion and associated atelectasis/ consolidation. Electronically Signed   By: Corrie Mckusick D.O.   On: 03/04/2017 10:24   Dg Chest 2 View  Result Date: 03/03/2017 CLINICAL DATA:  Altered mental status EXAM: CHEST  2 VIEW COMPARISON:  January 14, 2017. FINDINGS: There is a the persistent moderate left pleural effusion. There is persistent consolidation throughout much of the left lower lobe and portions of the lingula. There is also new patchy consolidation in the right lower lobe. There is a minimal right pleural effusion. Lungs elsewhere are clear. Heart size and pulmonary vascularity are normal. No adenopathy. There is aortic atherosclerosis. No bone lesions. No adenopathy. A TIPS shunt is noted in the right upper quadrant. IMPRESSION: Pleural effusion on the left with left base consolidation, stable. New patchy airspace consolidation right base with minimal right pleural effusion. Stable cardiac silhouette. There is aortic atherosclerosis. Aortic Atherosclerosis (ICD10-I70.0). Electronically Signed   By: Lowella Grip III M.D.   On: 03/03/2017 11:40   Dg Lumbar Spine 2-3 Views  Result Date: 03/05/2017 CLINICAL DATA:  Low back pain after feeling a pop. EXAM: LUMBAR SPINE - 2-3 VIEW COMPARISON:  CT abdomen pelvis dated October 09, 2013. FINDINGS: Five lumbar type vertebral bodies. No acute fracture or subluxation. Unchanged compression  deformity of L2. Remaining vertebral body heights are preserved. Focal kyphosis at L1-L2 is unchanged. Sagittal alignment is otherwise maintained. Intervertebral disc heights are maintained. Tip fistula again noted. Surgical clips in the left upper abdomen. Atherosclerotic vascular calcifications of the abdominal aorta. IMPRESSION: 1. No acute osseous abnormality. Unchanged chronic superior endplate compression deformity of L2. Electronically Signed   By: Titus Dubin M.D.   On: 03/05/2017 14:07   Mr Lumbar Spine Wo Contrast  Result Date: 03/06/2017 CLINICAL DATA:  Severe low back pain. Compression fracture seen on recent x-ray. EXAM: MRI LUMBAR SPINE WITHOUT CONTRAST TECHNIQUE: Multiplanar, multisequence MR imaging of the lumbar spine was performed. No intravenous contrast was administered. COMPARISON:  CT abdomen 10/09/2013 FINDINGS: Segmentation:  Standard. Alignment:  Physiologic. Vertebrae: No discitis or aggressive osseous lesion. Chronic L1 vertebral body compression fracture with approximately 10% height loss. Chronic L2 vertebral body compression fracture with approximately  50% height loss and 3 mm of retropulsion of the superior posterior margin of the L2 vertebral body. Conus medullaris and cauda equina: Conus extends to the L1 level. Conus and cauda equina appear normal. Paraspinal and other soft tissues: No paraspinal abnormality. Abdominal ascites. Disc levels: Disc spaces: Disc spaces are maintained. T12-L1: No significant disc bulge. No evidence of neural foraminal stenosis. No central canal stenosis. L1-L2: Minimal broad-based disc bulge eccentric towards the left. No evidence of neural foraminal stenosis. No central canal stenosis. L2-L3: Minimal broad-based disc bulge. No evidence of neural foraminal stenosis. No central canal stenosis. Mild bilateral facet arthropathy. L3-L4: Mild broad-based disc bulge. No evidence of neural foraminal stenosis. No central canal stenosis. Mild bilateral  facet arthropathy. L4-L5: No significant disc bulge. No evidence of neural foraminal stenosis. No central canal stenosis. Mild bilateral facet arthropathy. L5-S1: No significant disc bulge. No evidence of neural foraminal stenosis. No central canal stenosis. IMPRESSION: 1. No acute osseous injury of the lumbar spine. 2. 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 Electronically Signed   By: Kathreen Devoid   On: 03/06/2017 12:35   US Paracentesis  Result Date: 03/04/2017 INDICATION: History of NASH cirrhosis with recurrent abdominal ascites. Status post TIPS on January 06, 2017. Request for diagnostic and therapeutic paracentesis. EXAM: ULTRASOUND GUIDED RIGHT LATERAL ABDOMEN PARACENTESIS MEDICATIONS: 1% Lidocaine = 15 mL COMPLICATIONS: None immediate. PROCEDURE: Informed written consent was obtained from the patient after a discussion of the risks, benefits and alternatives to treatment. A timeout was performed prior to the initiation of the procedure. Initial ultrasound scanning demonstrates a large amount of ascites within the right lateral abdomen. The right lateral abdomen was prepped and draped in the usual sterile fashion. 1% lidocaine with epinephrine was used for local anesthesia. Following this, a 19 gauge, 7-cm, Yueh catheter was introduced. An ultrasound image was saved for documentation purposes. The paracentesis was performed. The catheter was removed and a dressing was applied. The patient tolerated the procedure well without immediate post procedural complication. FINDINGS: A total of approximately 5.3 liters of clear yellow fluid was removed. Samples were sent to the laboratory as requested by the clinical team. IMPRESSION: Successful ultrasound-guided paracentesis yielding 5.3 liters of peritoneal fluid. Read by:  Gareth Eagle, PA-C Electronically Signed   By: Marybelle Killings M.D.   On: 03/04/2017 09:54   US Paracentesis  Result Date: 02/20/2017 INDICATION: Cirrhosis likely due to NASH  EXAM: ULTRASOUND GUIDED THERAPEUTIC PARACENTESIS MEDICATIONS: None. COMPLICATIONS: None immediate. PROCEDURE: Procedure, benefits, and risks of procedure were discussed with patient. Written informed consent for procedure was obtained. Time out protocol followed. Adequate collection of ascites localized by ultrasound in RIGHT lower quadrant. Skin prepped and draped in usual sterile fashion. Skin and soft tissues anesthetized with 10 mL of 1% lidocaine. 5 [place] Yueh catheter placed into peritoneal cavity. 6 L of bright yellow ascitic fluid aspirated by vacuum bottle suction. Procedure tolerated well by patient without immediate complication. FINDINGS: As above IMPRESSION: Successful ultrasound-guided paracentesis yielding 6 liters of peritoneal fluid. Electronically Signed   By: Lavonia Dana M.D.   On: 02/20/2017 13:52   US Abdominal Pelvic Art/vent Flow Doppler  Result Date: 02/18/2017 CLINICAL DATA:  77 year old female with NASH cirrhosis complicated by medically refractory large volume ascites. She underwent creation of a TIPS on 01/06/2017 and presents today for scheduled follow-up. EXAM: DUPLEX ULTRASOUND OF LIVER AND TIPS SHUNT TECHNIQUE: Color and duplex Doppler ultrasound was performed to evaluate the hepatic in-flow and out-flow vessels. COMPARISON:  Most recent prior TIPS ultrasound 01/15/2017 FINDINGS: Portal Vein Velocities Main:  43 cm/sec, hepatopetal flow Right:  86 cm/sec, hepatopetal flow Left:  14 cm/sec, hepatofugal flow TIPS Stent Velocities Proximal:  86 cm/sec Mid:  148 Distal:  84 cm/sec Hepatic Vein Velocities Right:  84 cm/sec Mid:  16 cm/sec Left:  Unable to visualize. Splenic Vein: 38 centimeters/second Superior Mesenteric Vein: Not visualized Hepatic Artery: 82 centimeters/second Ascites: Moderate sonographically simple ascites. Varices: Absent. Other findings: Small, nodular liver consistent with the underlying history of cirrhosis. IMPRESSION: 1. Widely patent right hepatic to right  portal venous TIPS. Reversed flow in the left hepatic vein is not unexpected. 2. Moderate ascites. Signed, Criselda Peaches, MD [facility] Radiology Electronically Signed   By: Jacqulynn Cadet M.D.   On: 02/18/2017 12:11   US Liver Doppler  Result Date: 03/04/2017 CLINICAL DATA:  Status post TIPS in November.  Evaluate TIPS. EXAM: DUPLEX ULTRASOUND OF LIVER AND TIPS SHUNT TECHNIQUE: Color and duplex Doppler ultrasound was performed to evaluate the hepatic in-flow and out-flow vessels. COMPARISON:  Duplex ultrasound of liver and TIPS shunt performed 02/18/2017 FINDINGS: Portal Vein Velocities Main:  49.1 cm/sec, hepatopetal flow Right:  44.3 cm/sec, hepatopetal flow Left:  16.8 cm/sec, hepatofugal flow TIPS Stent Velocities Proximal:  32.2 cm/sec Mid:  80.2 cm/sec Distal:  85.8 cm/sec Hepatic Vein Velocities Right:  41.0 cm/sec Mid:  25.0 cm/sec Left:  25.8 cm/sec Splenic Vein:  24.1 cm/sec Superior Mesenteric Vein: Not visualized. Hepatic Artery:  101 cm/sec Ascites: A moderate to large amount of sonographically simple ascites is noted within the abdomen. Varices: Absent. Other findings: A small nodular liver is again noted, compatible with underlying history of cirrhosis. IMPRESSION: 1. Widely patent right hepatic to right portal venous TIPS. 2. Moderate to large volume ascites. Electronically Signed   By: Garald Balding M.D.   On: 03/04/2017 05:43   Ir Radiologist Eval & Mgmt  Result Date: 02/18/2017 Please refer to notes tab for details about interventional procedure. (Op Note)   A/P: I reviewed the MRI and I do agree with the radiologist.  The patient does have an L1, and L2 compression fracture which do appear to be chronic.  However I do think that more than likely they are the cause of her acute pain.  At this point since she was able to stand and ambulate I am hesitant to recommend aggressive surgical intervention.  My recommendation is oral  Valium as a muscle relaxer, and nasal calcitonin to help reduce the fracture pain.  If she has less pain in the morning and is ambulating with less discomfort then I would recommend ongoing conservative care.  If she decompensates and continues to suffer we may need to consider moving forward with a kyphoplasty of L1, and L2.  Discussed this with [name] her covering physician, and all questions and concerns were addressed.

## 2017-03-06 NOTE — Progress Notes (Signed)
PROGRESS NOTE    Cynthia Long  CLE:[id number] DOB: March 06, 1940 DOA: 03/03/2017 PCP: Macon Large, MD   Brief Narrative: Cynthia Long is a 77 y.o. female with medical history significant of female,with history ofNonalcoholic steatohepatitis,diabetes mellitus,gastritis, esophageal varices. She presented with hepatic encephalopathy. Currently treating with lactulose.    Assessment & Plan:   Active Problems:   HTN (hypertension)   Chronic allergic bronchitis   Hepatic cirrhosis (HCC)   Ascites   Uncontrolled type 2 diabetes mellitus , without long-term current use of insulin (HCC)   Liver cirrhosis secondary to NASH (HCC)   Hepatic encephalopathy (HCC)   Thrombocytopenia (HCC)   Hepatic encephalopathy Ammonia elevated at 77 now improved. Patient alert. Continue Lactulose TID;  Titrate for 2-3 soft bowel movements per day. 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 Carlean Purl as an outpatient. 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 Patient has had repeat paracenteses for ascites. 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 No evidence of bleeding. Stable.  Chronic allergic bronchitis Chronic cough which is stable.  -Continue albuterol and Pulmicort  History of diabetes mellitus Patient is diet controlled. Last A1C of 5.4.  Abnormal chest x-ray Possibly secondary to fluid overload. Given doxycycline in ED. Chronic cough. -follow clinically  L2 compression fracture Orthopedic consulted. Recommended intranasal calcitonin and Valium for muscle spasm as well as MRI spine. Also recommend to monitor overnight for pain control.  DVT prophylaxis: SCDs Code Status: DNR Family Communication: None at bedside Disposition Plan: When medically stable   Consultants:   None  Procedures:   Ultrasound paracentesis  Antimicrobials:  Doxycycline (03/03/17)     Subjective: Patient having back pain.  No nausea no vomiting.  No fever no chills.  Objective: Vitals:   03/05/17 2113 03/06/17 0556 03/06/17 0858 03/06/17 1300  BP: (!) 114/36 (!) 113/35 (!) 132/52 (!) 122/47  Pulse: 68 67 63 72  Resp:    16  Temp: 98.4 F (36.9 C) 98.4 F (36.9 C)  97.7 F (36.5 C)  TempSrc: Oral Oral  Oral  SpO2: 94% 92%  98%  Weight:      Height:        Intake/Output Summary (Last 24 hours) at 03/06/2017 1719 Last data filed at 03/06/2017 1300 Gross per 24 hour  Intake 480 ml  Output 175 ml  Net 305 ml   Filed Weights   03/03/17 1041  Weight: 61.2 kg (135 lb)    Examination:  General exam: Appears calm and comfortable. Respiratory system: Clear to auscultation. Diminished. Respiratory effort normal. Cardiovascular system: S1 & S2 heard, RRR. 2/6 systolic murmur. Gastrointestinal system: Abdomen is significantly distended, soft and non-tender with reducible and non-tender umbilical hernia. Normal bowel sounds heard. Central nervous system: Alert and oriented to person and place. No focal neurological deficits. Extremities: 2+ edema. No calf tenderness Skin: No cyanosis. No rashes Psychiatry: Judgement and insight appear impaired. Mood & affect appropriate.     Data Reviewed: I have personally reviewed following labs and imaging studies  CBC: Recent Labs  Lab 03/03/17 1221 03/04/17 0552 03/05/17 0520  WBC 7.2 7.4 7.1  NEUTROABS 5.8  --   --   HGB 13.2 11.7* 10.5*  HCT 38.3 34.1* 31.3*  MCV 93.2 92.4 93.2  PLT 63* 89* 86*   Basic Metabolic Panel: Recent Labs  Lab 03/03/17 1221 03/04/17 0552 03/05/17 0520  NA 133* 134* 132*  K 3.8 3.4* 3.2*  CL  102 103 104  CO2 20* 19* 21*  GLUCOSE 153* 142* 206*  BUN 17 18 17   CREATININE 1.00 0.90 0.90  CALCIUM 8.7* 8.3* 8.0*   GFR: Estimated Creatinine Clearance: 47.2 mL/min (by C-G formula based on SCr of 0.9 mg/dL). Liver Function Tests: Recent Labs  Lab 03/03/17 1221 03/04/17 0552  03/05/17 0520  AST 53* 42* 39  ALT 30 25 23   ALKPHOS 215* 167* 146*  BILITOT 6.5* 7.0* 5.6*  PROT 6.0* 5.2* 4.6*  ALBUMIN 2.3* 2.0* 1.7*   No results for input(s): LIPASE, AMYLASE in the last 168 hours. Recent Labs  Lab 03/03/17 1210 03/04/17 0718  AMMONIA 75* 45*   Coagulation Profile: Recent Labs  Lab 03/03/17 1221 03/04/17 0552  INR 1.18 1.32   Cardiac Enzymes: Recent Labs  Lab 03/03/17 1221  TROPONINI <0.03   BNP (last 3 results) No results for input(s): PROBNP in the last 8760 hours. HbA1C: No results for input(s): HGBA1C in the last 72 hours. CBG: Recent Labs  Lab 03/05/17 1624 03/05/17 2111 03/06/17 0641 03/06/17 1145 03/06/17 1637  GLUCAP 197* 192* 196* 226* 253*   Lipid Profile: No results for input(s): CHOL, HDL, LDLCALC, TRIG, CHOLHDL, LDLDIRECT in the last 72 hours. Thyroid Function Tests: No results for input(s): TSH, T4TOTAL, FREET4, T3FREE, THYROIDAB in the last 72 hours. Anemia Panel: No results for input(s): VITAMINB12, FOLATE, FERRITIN, TIBC, IRON, RETICCTPCT in the last 72 hours. Sepsis Labs: Recent Labs  Lab 03/03/17 1227 03/04/17 0718 03/05/17 0520  LATICACIDVEN 2.88* 2.7* 2.5*    Recent Results (from the past 240 hour(s))  Culture, blood (Routine x 2)     Status: None (Preliminary result)   Collection Time: 03/03/17 11:10 AM  Result Value Ref Range Status   Specimen Description BLOOD RIGHT HAND  Final   Special Requests IN PEDIATRIC BOTTLE Blood Culture adequate volume  Final   Culture NO GROWTH 3 DAYS  Final   Report Status PENDING  Incomplete  Culture, blood (Routine x 2)     Status: None (Preliminary result)   Collection Time: 03/03/17 12:21 PM  Result Value Ref Range Status   Specimen Description BLOOD LEFT HAND  Final   Special Requests IN PEDIATRIC BOTTLE Blood Culture adequate volume  Final   Culture NO GROWTH 3 DAYS  Final   Report Status PENDING  Incomplete  Culture, body fluid-bottle     Status: None (Preliminary  result)   Collection Time: 03/04/17 10:00 AM  Result Value Ref Range Status   Specimen Description PERITONEAL  Final   Special Requests NONE  Final   Culture NO GROWTH 2 DAYS  Final   Report Status PENDING  Incomplete  Gram stain     Status: None   Collection Time: 03/04/17 10:00 AM  Result Value Ref Range Status   Specimen Description PERITONEAL  Final   Special Requests NONE  Final   Gram Stain   Final    RARE WBC PRESENT, PREDOMINANTLY MONONUCLEAR NO ORGANISMS SEEN    Report Status 03/04/2017 FINAL  Final  Culture, Urine     Status: Abnormal   Collection Time: 03/04/17 10:30 AM  Result Value Ref Range Status   Specimen Description URINE, CLEAN CATCH  Final   Special Requests NONE  Final   Culture MULTIPLE SPECIES PRESENT, SUGGEST RECOLLECTION (A)  Final   Report Status 03/05/2017 FINAL  Final         Radiology Studies: Dg Lumbar Spine 2-3 Views  Result Date: 03/05/2017 CLINICAL DATA:  Low back pain after feeling a pop. EXAM: LUMBAR SPINE - 2-3 VIEW COMPARISON:  CT abdomen pelvis dated October 09, 2013. FINDINGS: Five lumbar type vertebral bodies. No acute fracture or subluxation. Unchanged compression deformity of L2. Remaining vertebral body heights are preserved. Focal kyphosis at L1-L2 is unchanged. Sagittal alignment is otherwise maintained. Intervertebral disc heights are maintained. Tip fistula again noted. Surgical clips in the left upper abdomen. Atherosclerotic vascular calcifications of the abdominal aorta. IMPRESSION: 1. No acute osseous abnormality. Unchanged chronic superior endplate compression deformity of L2. Electronically Signed   By: Titus Dubin M.D.   On: 03/05/2017 14:07   Mr Lumbar Spine Wo Contrast  Result Date: 03/06/2017 CLINICAL DATA:  Severe low back pain. Compression fracture seen on recent x-ray. EXAM: MRI LUMBAR SPINE WITHOUT CONTRAST TECHNIQUE: Multiplanar, multisequence MR imaging of the lumbar spine was performed. No intravenous contrast was  administered. COMPARISON:  CT abdomen 10/09/2013 FINDINGS: Segmentation:  Standard. Alignment:  Physiologic. Vertebrae: No discitis or aggressive osseous lesion. Chronic L1 vertebral body compression fracture with approximately 10% height loss. Chronic L2 vertebral body compression fracture with approximately 50% height loss and 3 mm of retropulsion of the superior posterior margin of the L2 vertebral body. Conus medullaris and cauda equina: Conus extends to the L1 level. Conus and cauda equina appear normal. Paraspinal and other soft tissues: No paraspinal abnormality. Abdominal ascites. Disc levels: Disc spaces: Disc spaces are maintained. T12-L1: No significant disc bulge. No evidence of neural foraminal stenosis. No central canal stenosis. L1-L2: Minimal broad-based disc bulge eccentric towards the left. No evidence of neural foraminal stenosis. No central canal stenosis. L2-L3: Minimal broad-based disc bulge. No evidence of neural foraminal stenosis. No central canal stenosis. Mild bilateral facet arthropathy. L3-L4: Mild broad-based disc bulge. No evidence of neural foraminal stenosis. No central canal stenosis. Mild bilateral facet arthropathy. L4-L5: No significant disc bulge. No evidence of neural foraminal stenosis. No central canal stenosis. Mild bilateral facet arthropathy. L5-S1: No significant disc bulge. No evidence of neural foraminal stenosis. No central canal stenosis. IMPRESSION: 1. No acute osseous injury of the lumbar spine. 2. 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 Electronically Signed   By: Kathreen Devoid   On: 03/06/2017 12:35        Scheduled Meds: . calcitonin (salmon)  1 spray Alternating Nares Daily  . diltiazem  120 mg Oral Daily  . furosemide  20 mg Intravenous BID  . Influenza vac split quadrivalent PF  0.5 mL Intramuscular Tomorrow-1000  . insulin aspart  0-9 Units Subcutaneous TID WC  . lactulose  20 g Oral TID  . sodium chloride flush  3 mL Intravenous  Q12H  . spironolactone  50 mg Oral Daily   Continuous Infusions: . sodium chloride       LOS: 3 days     Berle Mull, MD [facility] 03/06/2017, 5:19 PM  If 7PM-7AM, please contact night-coverage [email or web address] Password [facility] 03/06/2017, 5:19 PM

## 2017-03-06 NOTE — Progress Notes (Signed)
Pt out of room for MRI.

## 2017-03-06 NOTE — Progress Notes (Signed)
Physical Therapy Treatment Patient Details Name: Cynthia Long MRN: [id number] DOB: April 22, 1940 Today's Date: 03/06/2017    History of Present Illness Pt is a 36 y/ofemalewith a PMH ofnonalcoholic steatohepatitis,DM,gastritis, esophageal varices.She presented to the emergency department secondary to 3-day history of worsening confusion.     PT Comments    Pt continues to be limited by back pain. Today, pt states pain is more severe on the R side of her low back, however is painful bilaterally. Increased time and effort required for transition to EOB due to pain, however once up and ambulating, pt reports a decrease in pain. Continue to recommend 24 hour assist upon d/c with HHPT follow up. Will continue to follow and progress as able per POC.    Follow Up Recommendations  Home health PT;Supervision/Assistance - 24 hour     Equipment Recommendations  None recommended by PT    Recommendations for Other Services       Precautions / Restrictions Precautions Precautions: Fall Restrictions Weight Bearing Restrictions: No    Mobility  Bed Mobility Overal bed mobility: Needs Assistance Bed Mobility: Supine to Sit     Supine to sit: HOB elevated;Min guard     General bed mobility comments: Increased time. Guarding at the trunk due to low back pain, however pt able to transition to EOB without assist.   Transfers Overall transfer level: Needs assistance Equipment used: Rolling walker (2 wheeled) Transfers: Sit to/from Stand Sit to Stand: Min guard         General transfer comment: Hands-on guarding for safety.   Ambulation/Gait Ambulation/Gait assistance: Min guard Ambulation Distance (Feet): 140 Feet Assistive device: Rolling walker (2 wheeled) Gait Pattern/deviations: Step-through pattern;Decreased stride length;Trunk flexed Gait velocity: Decreased Gait velocity interpretation: Below normal speed for age/gender General Gait Details: Hands-on guarding throughout  session. Pt appears to improve overall gait technique with a smoother cadence as distance progressed.    Stairs            Wheelchair Mobility    Modified Rankin (Stroke Patients Only)       Balance Overall balance assessment: Needs assistance Sitting-balance support: Feet supported;No upper extremity supported Sitting balance-Leahy Scale: Fair     Standing balance support: Bilateral upper extremity supported;During functional activity Standing balance-Leahy Scale: Poor Standing balance comment: Reliant on RW for support                            Cognition Arousal/Alertness: Awake/alert Behavior During Therapy: WFL for tasks assessed/performed Overall Cognitive Status: Impaired/Different from baseline                       Memory: Decreased short-term memory Following Commands: Follows one step commands consistently;Follows one step commands with increased time;Follows multi-step commands inconsistently Safety/Judgement: Decreased awareness of safety;Decreased awareness of deficits Awareness: Emergent Problem Solving: Slow processing;Decreased initiation;Requires verbal cues        Exercises      General Comments        Pertinent Vitals/Pain Pain Assessment: Faces Faces Pain Scale: Hurts even more Pain Location: Lower back bilaterally - reports to the R of spine is more painful with palpation. Pain Descriptors / Indicators: Grimacing;Aching Pain Intervention(s): Limited activity within patient's tolerance;Monitored during session;Repositioned    Home Living                      Prior Function  PT Goals (current goals can now be found in the care plan section) Acute Rehab PT Goals Patient Stated Goal: Improve back pain PT Goal Formulation: With patient/family Time For Goal Achievement: 03/18/17 Potential to Achieve Goals: Good Progress towards PT goals: Progressing toward goals    Frequency    Min  3X/week      PT Plan Current plan remains appropriate    Co-evaluation              AM-PAC PT "6 Clicks" Daily Activity  Outcome Measure  Difficulty turning over in bed (including adjusting bedclothes, sheets and blankets)?: Unable Difficulty moving from lying on back to sitting on the side of the bed? : Unable Difficulty sitting down on and standing up from a chair with arms (e.g., wheelchair, bedside commode, etc,.)?: Unable Help needed moving to and from a bed to chair (including a wheelchair)?: A Little Help needed walking in hospital room?: A Little Help needed climbing 3-5 steps with a railing? : A Little 6 Click Score: 12    End of Session Equipment Utilized During Treatment: Gait belt Activity Tolerance: Patient limited by pain Patient left: in chair;with call bell/phone within reach Nurse Communication: Mobility status PT Visit Diagnosis: Unsteadiness on feet (R26.81);Pain;Other abnormalities of gait and mobility (R26.89) Pain - Right/Left: (Bilateral low back)     Time: 5726-2035 PT Time Calculation (min) (ACUTE ONLY): 27 min  Charges:  $Gait Training: 23-37 mins                    G Codes:       Rolinda Roan, PT, DPT Acute Rehabilitation Services Pager: [phone]    Thelma Comp 03/06/2017, 9:31 AM

## 2017-03-07 LAB — BASIC METABOLIC PANEL
ANION GAP: 7 (ref 5–15)
BUN: 14 mg/dL (ref 6–20)
CHLORIDE: 102 mmol/L (ref 101–111)
CO2: 20 mmol/L — ABNORMAL LOW (ref 22–32)
Calcium: 7.9 mg/dL — ABNORMAL LOW (ref 8.9–10.3)
Creatinine, Ser: 0.94 mg/dL (ref 0.44–1.00)
GFR calc Af Amer: >60 mL/min (ref >60)
GFR calc non Af Amer: 58 mL/min — ABNORMAL LOW (ref >60)
GLUCOSE: 202 mg/dL — AB (ref 65–99)
Potassium: 4.6 mmol/L (ref 3.5–5.1)
Sodium: 129 mmol/L — ABNORMAL LOW (ref 135–145)

## 2017-03-07 LAB — GLUCOSE, CAPILLARY
GLUCOSE-CAPILLARY: 190 mg/dL — AB (ref 65–99)
GLUCOSE-CAPILLARY: 202 mg/dL — AB (ref 65–99)
GLUCOSE-CAPILLARY: 245 mg/dL — AB (ref 65–99)
Glucose-Capillary: 222 mg/dL — ABNORMAL HIGH (ref 65–99)

## 2017-03-07 LAB — MAGNESIUM: Magnesium: 1.7 mg/dL (ref 1.7–2.4)

## 2017-03-07 LAB — CBC
HEMATOCRIT: 33.8 % — AB (ref 36.0–46.0)
HEMOGLOBIN: 11.1 g/dL — AB (ref 12.0–15.0)
MCH: 30.2 pg (ref 26.0–34.0)
MCHC: 32.8 g/dL (ref 30.0–36.0)
MCV: 92.1 fL (ref 78.0–100.0)
Platelets: 109 10*3/uL — ABNORMAL LOW (ref 150–400)
RBC: 3.67 MIL/uL — ABNORMAL LOW (ref 3.87–5.11)
RDW: 15.3 % (ref 11.5–15.5)
WBC: 10.7 10*3/uL — AB (ref 4.0–10.5)

## 2017-03-07 MED ORDER — INSULIN ASPART 100 UNIT/ML ~~LOC~~ SOLN
0.0000 [IU] | Freq: Every day | SUBCUTANEOUS | Status: DC
  Administered 2017-03-07 – 2017-03-09 (×3): 2 [IU] via SUBCUTANEOUS

## 2017-03-07 MED ORDER — METHOCARBAMOL 500 MG PO TABS
500.0000 mg | ORAL_TABLET | Freq: Three times a day (TID) | ORAL | Status: DC
  Administered 2017-03-07 – 2017-03-13 (×19): 500 mg via ORAL
  Filled 2017-03-07 (×20): qty 1

## 2017-03-07 MED ORDER — LACTULOSE 10 GM/15ML PO SOLN
30.0000 g | Freq: Three times a day (TID) | ORAL | Status: DC
  Administered 2017-03-07 – 2017-03-13 (×18): 30 g via ORAL
  Filled 2017-03-07 (×19): qty 45

## 2017-03-07 MED ORDER — INSULIN ASPART 100 UNIT/ML ~~LOC~~ SOLN
0.0000 [IU] | Freq: Three times a day (TID) | SUBCUTANEOUS | Status: DC
  Administered 2017-03-07 – 2017-03-08 (×3): 5 [IU] via SUBCUTANEOUS
  Administered 2017-03-08: 3 [IU] via SUBCUTANEOUS
  Administered 2017-03-09 (×2): 5 [IU] via SUBCUTANEOUS
  Administered 2017-03-09: 8 [IU] via SUBCUTANEOUS
  Administered 2017-03-10 – 2017-03-11 (×4): 5 [IU] via SUBCUTANEOUS
  Administered 2017-03-11 – 2017-03-13 (×4): 3 [IU] via SUBCUTANEOUS
  Administered 2017-03-13: 5 [IU] via SUBCUTANEOUS
  Administered 2017-03-13: 3 [IU] via SUBCUTANEOUS

## 2017-03-07 NOTE — Care Management Important Message (Signed)
Important Message  Patient Details  Name: Cynthia Long MRN: [id number] Date of Birth: Jul 11, 1940   Medicare Important Message Given:  Yes    Mathias Bogacki [address]ine Circle 03/07/2017, 1:59 PM

## 2017-03-07 NOTE — Progress Notes (Signed)
PROGRESS NOTE    Cynthia Long  UXN:[id number] DOB: [birth year] DOA: 03/03/2017 PCP: Macon Large, MD   Brief Narrative: Cynthia Long is a 77 y.o. female with medical history significant of female,with history ofNonalcoholic steatohepatitis,diabetes mellitus,gastritis, esophageal varices. She presented with hepatic encephalopathy. Currently treating with lactulose.    Assessment & Plan:   Active Problems:   HTN (hypertension)   Chronic allergic bronchitis   Hepatic cirrhosis (HCC)   Ascites   Uncontrolled type 2 diabetes mellitus , without long-term current use of insulin (HCC)   Liver cirrhosis secondary to NASH (HCC)   Hepatic encephalopathy (HCC)   Thrombocytopenia (HCC)   Hepatic encephalopathy Ammonia elevated at 77 now improved. Patient alert. Continue Lactulose TID; dose increased today due to no BM today. Titrate for 2-3 soft bowel movements per day. 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 Carlean Purl as an outpatient. 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 Patient has had repeat paracenteses for ascites. -Continue lactulose, spironolactone Will resume Lasix oral tomorrow.  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 No evidence of bleeding. Stable.  Chronic allergic bronchitis Chronic cough which is stable.  -Continue albuterol and Pulmicort  History of diabetes mellitus Patient is diet controlled. Last A1C of 5.4. Using sliding scale insulin changing from sensitive to moderate.  Abnormal chest x-ray Possibly secondary to fluid overload. Given doxycycline in ED. Chronic cough. -follow clinically  L2 compression fracture Orthopedic consulted. Recommended intranasal calcitonin and Valium for muscle spasm as well as MRI spine. Discussion with orthopedics, will switch her to schedule Robaxin.  Monitor overnight.  DVT prophylaxis: SCDs Code Status: DNR Family Communication: None at  bedside Disposition Plan: When medically stable   Consultants:   None  Procedures:   Ultrasound paracentesis  Antimicrobials:  Doxycycline (03/03/17)    Subjective: Patient having back pain.  No nausea no vomiting.  No fever no chills.  Objective: Vitals:   03/06/17 1300 03/06/17 2145 03/07/17 0630 03/07/17 1330  BP: (!) 122/47 (!) 111/48 (!) 120/47 (!) 115/42  Pulse: 72 (!) 59 66 62  Resp: 16 15 16 16   Temp: 97.7 F (36.5 C) 98.1 F (36.7 C) 98.8 F (37.1 C) 97.7 F (36.5 C)  TempSrc: Oral Axillary Axillary Oral  SpO2: 98% 98% 98% 98%  Weight:      Height:        Intake/Output Summary (Last 24 hours) at 03/07/2017 1634 Last data filed at 03/07/2017 1300 Gross per 24 hour  Intake 240 ml  Output -  Net 240 ml   Filed Weights   03/03/17 1041  Weight: 61.2 kg (135 lb)    Examination:  General exam: Appears calm and comfortable. Respiratory system: Clear to auscultation. Diminished. Respiratory effort normal. Cardiovascular system: S1 & S2 heard, RRR. 2/6 systolic murmur. Gastrointestinal system: Abdomen is significantly distended, soft and non-tender with reducible and non-tender umbilical hernia. Normal bowel sounds heard. Central nervous system: Alert and oriented to person and place. No focal neurological deficits. Extremities: 2+ edema. No calf tenderness Skin: No cyanosis. No rashes Psychiatry: Judgement and insight appear impaired. Mood & affect appropriate.     Data Reviewed: I have personally reviewed following labs and imaging studies  CBC: Recent Labs  Lab 03/03/17 1221 03/04/17 0552 03/05/17 0520 03/07/17 0727  WBC 7.2 7.4 7.1 10.7*  NEUTROABS 5.8  --   --   --   HGB 13.2 11.7* 10.5* 11.1*  HCT 38.3 34.1* 31.3* 33.8*  MCV 93.2  92.4 93.2 92.1  PLT 63* 89* 86* 941*   Basic Metabolic Panel: Recent Labs  Lab 03/03/17 1221 03/04/17 0552 03/05/17 0520 03/07/17 0727  NA 133* 134* 132* 129*  K 3.8 3.4* 3.2* 4.6  CL 102 103 104 102   CO2 20* 19* 21* 20*  GLUCOSE 153* 142* 206* 202*  BUN 17 18 17 14   CREATININE 1.00 0.90 0.90 0.94  CALCIUM 8.7* 8.3* 8.0* 7.9*  MG  --   --   --  1.7   GFR: Estimated Creatinine Clearance: 45.2 mL/min (by C-G formula based on SCr of 0.94 mg/dL). Liver Function Tests: Recent Labs  Lab 03/03/17 1221 03/04/17 0552 03/05/17 0520  AST 53* 42* 39  ALT 30 25 23   ALKPHOS 215* 167* 146*  BILITOT 6.5* 7.0* 5.6*  PROT 6.0* 5.2* 4.6*  ALBUMIN 2.3* 2.0* 1.7*   No results for input(s): LIPASE, AMYLASE in the last 168 hours. Recent Labs  Lab 03/03/17 1210 03/04/17 0718  AMMONIA 75* 45*   Coagulation Profile: Recent Labs  Lab 03/03/17 1221 03/04/17 0552  INR 1.18 1.32   Cardiac Enzymes: Recent Labs  Lab 03/03/17 1221  TROPONINI <0.03   BNP (last 3 results) No results for input(s): PROBNP in the last 8760 hours. HbA1C: No results for input(s): HGBA1C in the last 72 hours. CBG: Recent Labs  Lab 03/06/17 1145 03/06/17 1637 03/06/17 2144 03/07/17 0632 03/07/17 1122  GLUCAP 226* 253* 231* 190* 202*   Lipid Profile: No results for input(s): CHOL, HDL, LDLCALC, TRIG, CHOLHDL, LDLDIRECT in the last 72 hours. Thyroid Function Tests: No results for input(s): TSH, T4TOTAL, FREET4, T3FREE, THYROIDAB in the last 72 hours. Anemia Panel: No results for input(s): VITAMINB12, FOLATE, FERRITIN, TIBC, IRON, RETICCTPCT in the last 72 hours. Sepsis Labs: Recent Labs  Lab 03/03/17 1227 03/04/17 0718 03/05/17 0520  LATICACIDVEN 2.88* 2.7* 2.5*    Recent Results (from the past 240 hour(s))  Culture, blood (Routine x 2)     Status: None (Preliminary result)   Collection Time: 03/03/17 11:10 AM  Result Value Ref Range Status   Specimen Description BLOOD RIGHT HAND  Final   Special Requests IN PEDIATRIC BOTTLE Blood Culture adequate volume  Final   Culture NO GROWTH 4 DAYS  Final   Report Status PENDING  Incomplete  Culture, blood (Routine x 2)     Status: None (Preliminary  result)   Collection Time: 03/03/17 12:21 PM  Result Value Ref Range Status   Specimen Description BLOOD LEFT HAND  Final   Special Requests IN PEDIATRIC BOTTLE Blood Culture adequate volume  Final   Culture NO GROWTH 4 DAYS  Final   Report Status PENDING  Incomplete  Culture, body fluid-bottle     Status: None (Preliminary result)   Collection Time: 03/04/17 10:00 AM  Result Value Ref Range Status   Specimen Description PERITONEAL  Final   Special Requests NONE  Final   Culture NO GROWTH 3 DAYS  Final   Report Status PENDING  Incomplete  Gram stain     Status: None   Collection Time: 03/04/17 10:00 AM  Result Value Ref Range Status   Specimen Description PERITONEAL  Final   Special Requests NONE  Final   Gram Stain   Final    RARE WBC PRESENT, PREDOMINANTLY MONONUCLEAR NO ORGANISMS SEEN    Report Status 03/04/2017 FINAL  Final  Culture, Urine     Status: Abnormal   Collection Time: 03/04/17 10:30 AM  Result Value Ref Range  Status   Specimen Description URINE, CLEAN CATCH  Final   Special Requests NONE  Final   Culture MULTIPLE SPECIES PRESENT, SUGGEST RECOLLECTION (A)  Final   Report Status 03/05/2017 FINAL  Final         Radiology Studies: Mr Lumbar Spine Wo Contrast  Result Date: 03/06/2017 CLINICAL DATA:  Severe low back pain. Compression fracture seen on recent x-ray. EXAM: MRI LUMBAR SPINE WITHOUT CONTRAST TECHNIQUE: Multiplanar, multisequence MR imaging of the lumbar spine was performed. No intravenous contrast was administered. COMPARISON:  CT abdomen 10/09/2013 FINDINGS: Segmentation:  Standard. Alignment:  Physiologic. Vertebrae: No discitis or aggressive osseous lesion. Chronic L1 vertebral body compression fracture with approximately 10% height loss. Chronic L2 vertebral body compression fracture with approximately 50% height loss and 3 mm of retropulsion of the superior posterior margin of the L2 vertebral body. Conus medullaris and cauda equina: Conus extends to  the L1 level. Conus and cauda equina appear normal. Paraspinal and other soft tissues: No paraspinal abnormality. Abdominal ascites. Disc levels: Disc spaces: Disc spaces are maintained. T12-L1: No significant disc bulge. No evidence of neural foraminal stenosis. No central canal stenosis. L1-L2: Minimal broad-based disc bulge eccentric towards the left. No evidence of neural foraminal stenosis. No central canal stenosis. L2-L3: Minimal broad-based disc bulge. No evidence of neural foraminal stenosis. No central canal stenosis. Mild bilateral facet arthropathy. L3-L4: Mild broad-based disc bulge. No evidence of neural foraminal stenosis. No central canal stenosis. Mild bilateral facet arthropathy. L4-L5: No significant disc bulge. No evidence of neural foraminal stenosis. No central canal stenosis. Mild bilateral facet arthropathy. L5-S1: No significant disc bulge. No evidence of neural foraminal stenosis. No central canal stenosis. IMPRESSION: 1. No acute osseous injury of the lumbar spine. 2. 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 Electronically Signed   By: Kathreen Devoid   On: 03/06/2017 12:35        Scheduled Meds: . calcitonin (salmon)  1 spray Alternating Nares Daily  . diltiazem  120 mg Oral Daily  . Influenza vac split quadrivalent PF  0.5 mL Intramuscular Tomorrow-1000  . insulin aspart  0-15 Units Subcutaneous TID WC  . insulin aspart  0-5 Units Subcutaneous QHS  . lactulose  30 g Oral TID  . methocarbamol  500 mg Oral TID  . sodium chloride flush  3 mL Intravenous Q12H  . spironolactone  50 mg Oral Daily   Continuous Infusions: . sodium chloride       LOS: 4 days     Berle Mull, MD [facility] 03/07/2017, 4:34 PM  If 7PM-7AM, please contact night-coverage [email or web address] Password [facility] 03/07/2017, 4:34 PM

## 2017-03-07 NOTE — Progress Notes (Signed)
Inpatient Diabetes Program Recommendations  AACE/ADA: New Consensus Statement on Inpatient Glycemic Control (2015)  Target Ranges:  Prepandial:   less than 140 mg/dL      Peak postprandial:   less than 180 mg/dL (1-2 hours)      Critically ill patients:  140 - 180 mg/dL   Results for LUCYNDA, ROSANO (MRN [id number]) as of 03/07/2017 11:02  Ref. Range 03/06/2017 06:41 03/06/2017 11:45 03/06/2017 16:37 03/06/2017 21:44 03/07/2017 06:32  Glucose-Capillary Latest Ref Range: 65 - 99 mg/dL 196 (H) 226 (H) 253 (H) 231 (H) 190 (H)   Review of Glycemic Control  Diabetes history: DM 2 Outpatient Diabetes medications: None Current orders for Inpatient glycemic control: Novolog Sensitive Correction 0-9 units tid  Inpatient Diabetes Program Recommendations:    Majority of glucose running in upper 100-200 level. If patient remains inpatient, consider increasing correction scale to Novolog Moderate 0-15 units tid + Novolog HS scale. Last A1c 5.4% on 01/15/2017. Due to glucose elevations, patient will need PCP follow up and another A1c drawn.  Thanks,  Tama Headings RN, MSN, [facility] Inpatient Diabetes Coordinator Team Pager [phone] (8a-5p)

## 2017-03-07 NOTE — Progress Notes (Signed)
Patients family was concerned about care plan, and what care team is doing for patient. I went through the care plan with family members, as well as notified [name] of concerns. [name], and RN addressed family concerns. Family member contacted Dr. Rolena Infante about additional plans, and what they would like to see done.

## 2017-03-07 NOTE — Progress Notes (Signed)
Physical Therapy Treatment Patient Details Name: Cynthia Long MRN: [id number] DOB: 1940-04-22 Today's Date: 03/07/2017    History of Present Illness Pt is a 6 y/ofemalewith a PMH ofnonalcoholic steatohepatitis,DM,gastritis, esophageal varices.She presented to the emergency department secondary to 3-day history of worsening confusion. Dx hepatic encephalopathy, L1 and L2 compression fxs.    PT Comments    Attempted rolling and supine to to sit multiple times, pt unable to fully roll to side or to achieve sitting position due to severe back pain. Repositioned pt in supine. Significant decrease in activity tolerance today. Will follow.   Follow Up Recommendations  Home health PT;Supervision/Assistance - 24 hour     Equipment Recommendations  None recommended by PT    Recommendations for Other Services       Precautions / Restrictions Precautions Precautions: Fall Restrictions Weight Bearing Restrictions: No    Mobility  Bed Mobility Overal bed mobility: Needs Assistance Bed Mobility: Rolling           General bed mobility comments: attempted to roll to R side multiple times but pt unable to tolerate this 2* severe back pain  Transfers                 General transfer comment: unable  Ambulation/Gait             General Gait Details: unable   Stairs            Wheelchair Mobility    Modified Rankin (Stroke Patients Only)       Balance                                            Cognition Arousal/Alertness: Lethargic Behavior During Therapy: WFL for tasks assessed/performed Overall Cognitive Status: Impaired/Different from baseline Area of Impairment: Following commands                       Following Commands: Follows one step commands with increased time;Follows multi-step commands inconsistently     Problem Solving: Slow processing;Decreased initiation;Requires verbal cues General Comments: pt  lethargic, eyes closed most of sesssion but did briefly open them, minimal verbal responses to questions      Exercises      General Comments        Pertinent Vitals/Pain Faces Pain Scale: Hurts whole lot Pain Location: low back with movement Pain Descriptors / Indicators: Grimacing;Aching Pain Intervention(s): Limited activity within patient's tolerance;Monitored during session;Premedicated before session    Home Living                      Prior Function            PT Goals (current goals can now be found in the care plan section) Acute Rehab PT Goals Patient Stated Goal: Improve back pain PT Goal Formulation: With patient Time For Goal Achievement: 03/18/17 Potential to Achieve Goals: Fair Progress towards PT goals: Not progressing toward goals - comment(pain limiting activity tolerance)    Frequency    Min 3X/week      PT Plan Current plan remains appropriate    Co-evaluation              AM-PAC PT "6 Clicks" Daily Activity  Outcome Measure  Difficulty turning over in bed (including adjusting bedclothes, sheets and blankets)?: Unable Difficulty moving from lying on back to sitting  on the side of the bed? : Unable Difficulty sitting down on and standing up from a chair with arms (e.g., wheelchair, bedside commode, etc,.)?: Unable Help needed moving to and from a bed to chair (including a wheelchair)?: Total Help needed walking in hospital room?: Total Help needed climbing 3-5 steps with a railing? : Total 6 Click Score: 6    End of Session   Activity Tolerance: Patient limited by pain Patient left: in bed;with call bell/phone within reach;with bed alarm set Nurse Communication: Mobility status PT Visit Diagnosis: Unsteadiness on feet (R26.81);Pain;Other abnormalities of gait and mobility (R26.89)     Time: 3903-0092 PT Time Calculation (min) (ACUTE ONLY): 13 min  Charges:  $Therapeutic Activity: 8-22 mins                    G Codes:           Philomena Doheny 03/07/2017, 11:48 AM [phone]

## 2017-03-08 LAB — BASIC METABOLIC PANEL
ANION GAP: 5 (ref 5–15)
Anion gap: 7 (ref 5–15)
Anion gap: 7 (ref 5–15)
BUN: 14 mg/dL (ref 6–20)
BUN: 14 mg/dL (ref 6–20)
BUN: 13 mg/dL (ref 6–20)
CHLORIDE: 98 mmol/L — AB (ref 101–111)
CHLORIDE: 95 mmol/L — AB (ref 101–111)
CO2: 22 mmol/L (ref 22–32)
CO2: 24 mmol/L (ref 22–32)
CO2: 22 mmol/L (ref 22–32)
CREATININE: 0.88 mg/dL (ref 0.44–1.00)
Calcium: 7.9 mg/dL — ABNORMAL LOW (ref 8.9–10.3)
Calcium: 7.8 mg/dL — ABNORMAL LOW (ref 8.9–10.3)
Calcium: 7.5 mg/dL — ABNORMAL LOW (ref 8.9–10.3)
Chloride: 97 mmol/L — ABNORMAL LOW (ref 101–111)
Creatinine, Ser: 0.77 mg/dL (ref 0.44–1.00)
Creatinine, Ser: 0.77 mg/dL (ref 0.44–1.00)
GFR calc Af Amer: >60 mL/min (ref >60)
GFR calc Af Amer: >60 mL/min (ref >60)
GFR calc Af Amer: >60 mL/min (ref >60)
GFR calc non Af Amer: >60 mL/min (ref >60)
GFR calc non Af Amer: >60 mL/min (ref >60)
Glucose, Bld: 217 mg/dL — ABNORMAL HIGH (ref 65–99)
Glucose, Bld: 247 mg/dL — ABNORMAL HIGH (ref 65–99)
Glucose, Bld: 271 mg/dL — ABNORMAL HIGH (ref 65–99)
POTASSIUM: 4.4 mmol/L (ref 3.5–5.1)
POTASSIUM: 4.4 mmol/L (ref 3.5–5.1)
Potassium: 4 mmol/L (ref 3.5–5.1)
SODIUM: 126 mmol/L — AB (ref 135–145)
SODIUM: 124 mmol/L — AB (ref 135–145)
Sodium: 127 mmol/L — ABNORMAL LOW (ref 135–145)

## 2017-03-08 LAB — CBC
HEMATOCRIT: 32.4 % — AB (ref 36.0–46.0)
HEMOGLOBIN: 10.8 g/dL — AB (ref 12.0–15.0)
MCH: 30.6 pg (ref 26.0–34.0)
MCHC: 33.3 g/dL (ref 30.0–36.0)
MCV: 91.8 fL (ref 78.0–100.0)
Platelets: 92 10*3/uL — ABNORMAL LOW (ref 150–400)
RBC: 3.53 MIL/uL — AB (ref 3.87–5.11)
RDW: 15.4 % (ref 11.5–15.5)
WBC: 9 10*3/uL (ref 4.0–10.5)

## 2017-03-08 LAB — LIPASE, FLUID: Lipase-Fluid: 25 U/L

## 2017-03-08 LAB — TOTAL BILIRUBIN, BODY FLUID: Total bilirubin, fluid: 0.9 mg/dL

## 2017-03-08 LAB — CULTURE, BLOOD (ROUTINE X 2)
Culture: NO GROWTH
Culture: NO GROWTH
SPECIAL REQUESTS: ADEQUATE
SPECIAL REQUESTS: ADEQUATE

## 2017-03-08 LAB — GLUCOSE, CAPILLARY
GLUCOSE-CAPILLARY: 200 mg/dL — AB (ref 65–99)
GLUCOSE-CAPILLARY: 248 mg/dL — AB (ref 65–99)
Glucose-Capillary: 224 mg/dL — ABNORMAL HIGH (ref 65–99)
Glucose-Capillary: 248 mg/dL — ABNORMAL HIGH (ref 65–99)

## 2017-03-08 LAB — TSH: TSH: 1.228 u[IU]/mL (ref 0.350–4.500)

## 2017-03-08 LAB — OSMOLALITY: OSMOLALITY: 278 mosm/kg (ref 275–295)

## 2017-03-08 LAB — URIC ACID: URIC ACID, SERUM: 4.5 mg/dL (ref 2.3–6.6)

## 2017-03-08 MED ORDER — FUROSEMIDE 10 MG/ML IJ SOLN
20.0000 mg | Freq: Once | INTRAMUSCULAR | Status: AC
  Administered 2017-03-08: 20 mg via INTRAVENOUS
  Filled 2017-03-08: qty 2

## 2017-03-08 MED ORDER — TRAMADOL HCL 50 MG PO TABS
50.0000 mg | ORAL_TABLET | Freq: Four times a day (QID) | ORAL | Status: DC | PRN
  Administered 2017-03-09 – 2017-03-12 (×6): 50 mg via ORAL
  Filled 2017-03-08 (×7): qty 1

## 2017-03-08 MED ORDER — FUROSEMIDE 20 MG PO TABS
20.0000 mg | ORAL_TABLET | Freq: Every day | ORAL | Status: DC
  Administered 2017-03-08: 20 mg via ORAL
  Filled 2017-03-08: qty 1

## 2017-03-08 MED ORDER — FUROSEMIDE 10 MG/ML IJ SOLN: 20.0000 mg | Freq: Once | INTRAMUSCULAR | Status: DC

## 2017-03-08 MED ORDER — SODIUM CHLORIDE 1 G PO TABS
2.0000 g | ORAL_TABLET | Freq: Two times a day (BID) | ORAL | Status: DC
  Administered 2017-03-08 – 2017-03-11 (×6): 2 g via ORAL
  Filled 2017-03-08 (×8): qty 2

## 2017-03-08 MED ORDER — SODIUM CHLORIDE 1 G PO TABS
1.0000 g | ORAL_TABLET | Freq: Two times a day (BID) | ORAL | Status: DC
  Filled 2017-03-08: qty 1

## 2017-03-08 NOTE — Progress Notes (Signed)
    Subjective:     Patient reports pain as 5 on 0-10 scale.  Reports none leg pain reports back pain - slightly improved with robaxin Positive void Negative bowel movement Positive flatus Negative chest pain or shortness of breath  Objective: Vital signs in last 24 hours: Temp:  [97.6 F (36.4 C)-98.5 F (36.9 C)] 98.5 F (36.9 C) (01/05 0518) Pulse Rate:  [62-72] 72 (01/05 0518) Resp:  [16] 16 (01/04 1330) BP: (115-128)/(38-44) 115/38 (01/05 0518) SpO2:  [98 %-99 %] 99 % (01/05 0518)  Intake/Output from previous day: 01/04 0701 - 01/05 0700 In: 360 [P.O.:360] Out: -   Labs: Recent Labs    03/07/17 0727 03/08/17 0551  WBC 10.7* 9.0  RBC 3.67* 3.53*  HCT 33.8* 32.4*  PLT 109* 92*   Recent Labs    03/07/17 0727 03/08/17 0551  NA 129* 127*  K 4.6 4.4  CL 102 98*  CO2 20* 22  BUN 14 14  CREATININE 0.94 0.77  GLUCOSE 202* 217*  CALCIUM 7.9* 7.9*   No results for input(s): LABPT, INR in the last 72 hours.  Physical Exam: Neurologically intact ABD soft Intact pulses distally Compartment soft Body mass index is 24.69 kg/m.   Assessment/Plan: Patient stable  xrays n/a Continue mobilization with physical therapy Continue care  Up with therapy - if patient continues to do poorly with therapy due to back pain will plan on kyphoplasty to address L1 & 2 compression fractures.   At this point I can't identify any other cause for her pain.  Although fractures are chronic, her pain may respond to the kyphoplasty.  Will discuss with family. If surgery required will move forward with it next week provided she is medically cleared  Melina Schools, MD [place] [phone]

## 2017-03-08 NOTE — Progress Notes (Signed)
PROGRESS NOTE    Cynthia Long  ZJI:[id number] DOB: 1941-01-04 DOA: 03/03/2017 PCP: Macon Large, MD   Brief Narrative: Cynthia Long is a 77 y.o. female with medical history significant of female,with history ofNonalcoholic steatohepatitis,diabetes mellitus,gastritis, esophageal varices. She presented with hepatic encephalopathy. Currently treating with lactulose.    Assessment & Plan:   Active Problems:   HTN (hypertension)   Chronic allergic bronchitis   Hepatic cirrhosis (HCC)   Ascites   Uncontrolled type 2 diabetes mellitus , without long-term current use of insulin (HCC)   Liver cirrhosis secondary to NASH (HCC)   Hepatic encephalopathy (HCC)   Thrombocytopenia (HCC)   Hepatic encephalopathy Ammonia elevated at 77 now improved. Patient alert. Continue Lactulose TID; dose increased today due to no BM today. Titrate for 2-3 soft bowel movements per day. -US paracentesis unremarkable No asterixis.  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 Carlean Purl as an outpatient. 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 Patient has had repeat paracenteses for ascites. -Continue lactulose, spironolactone Resume oral Lasix today.  Hyponatremia and cirrhosis.  Corrected sodium 130. Fluid restricted diet. Recheck sodium at 8 PM.  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 No evidence of bleeding. Stable.  Chronic allergic bronchitis Chronic cough which is stable.  -Continue albuterol and Pulmicort  History of diabetes mellitus Patient is diet controlled. Last A1C of 5.4. Using sliding scale insulin changing from sensitive to moderate.  Abnormal chest x-ray Possibly secondary to fluid overload. Given doxycycline in ED. Chronic cough. -follow clinically  L2 compression fracture Orthopedic consulted. Recommended intranasal calcitonin and Valium for muscle spasm as well as MRI spine. Discussion with orthopedics, will switch her to schedule  Robaxin.  Recommend to continue monitoring in the hospital, orthopedic recommends to consider kyphoplasty to family.  DVT prophylaxis: SCDs Code Status: DNR Family Communication: None at bedside Disposition Plan: When medically stable   Consultants:   None  Procedures:   Ultrasound paracentesis  Antimicrobials:  Doxycycline (03/03/17)    Subjective: Continues to have back pain although he still able to mobilize with that pain.  No nausea no vomiting.  Objective: Vitals:   03/07/17 2046 03/08/17 0518 03/08/17 0740 03/08/17 1300  BP: (!) 128/44 (!) 115/38  (!) 110/40  Pulse: 63 72  71  Resp:    16  Temp: 97.6 F (36.4 C) 98.5 F (36.9 C)  97.9 F (36.6 C)  TempSrc: Oral Oral  Oral  SpO2: 98% 99%  100%  Weight:   64.7 kg (142 lb 10.2 oz)   Height:        Intake/Output Summary (Last 24 hours) at 03/08/2017 1835 Last data filed at 03/08/2017 1648 Gross per 24 hour  Intake -  Output 450 ml  Net -450 ml   Filed Weights   03/03/17 1041 03/08/17 0740  Weight: 61.2 kg (135 lb) 64.7 kg (142 lb 10.2 oz)    Examination:  General exam: Appears calm and comfortable.  No asterixis Respiratory system: Clear to auscultation. Diminished. Respiratory effort normal. Cardiovascular system: S1 & S2 heard, RRR. 2/6 systolic murmur. Gastrointestinal system: Abdomen is significantly distended, soft and non-tender with reducible and non-tender umbilical hernia. Normal bowel sounds heard. Central nervous system: Alert and oriented to person and place. No focal neurological deficits. Extremities: 2+ edema. No calf tenderness Skin: No cyanosis. No rashes Psychiatry: Judgement and insight appear stable. Mood & affect appropriate.     Data Reviewed: I have personally reviewed following labs and imaging studies  CBC: Recent  Labs  Lab 03/03/17 1221 03/04/17 0552 03/05/17 0520 03/07/17 0727 03/08/17 0551  WBC 7.2 7.4 7.1 10.7* 9.0  NEUTROABS 5.8  --   --   --   --   HGB 13.2  11.7* 10.5* 11.1* 10.8*  HCT 38.3 34.1* 31.3* 33.8* 32.4*  MCV 93.2 92.4 93.2 92.1 91.8  PLT 63* 89* 86* 109* 92*   Basic Metabolic Panel: Recent Labs  Lab 03/04/17 0552 03/05/17 0520 03/07/17 0727 03/08/17 0551 03/08/17 1221  NA 134* 132* 129* 127* 126*  K 3.4* 3.2* 4.6 4.4 4.4  CL 103 104 102 98* 97*  CO2 19* 21* 20* 22 24  GLUCOSE 142* 206* 202* 217* 247*  BUN 18 17 14 14 14   CREATININE 0.90 0.90 0.94 0.77 0.77  CALCIUM 8.3* 8.0* 7.9* 7.9* 7.8*  MG  --   --  1.7  --   --    GFR: Estimated Creatinine Clearance: 54.4 mL/min (by C-G formula based on SCr of 0.77 mg/dL). Liver Function Tests: Recent Labs  Lab 03/03/17 1221 03/04/17 0552 03/05/17 0520  AST 53* 42* 39  ALT 30 25 23   ALKPHOS 215* 167* 146*  BILITOT 6.5* 7.0* 5.6*  PROT 6.0* 5.2* 4.6*  ALBUMIN 2.3* 2.0* 1.7*   No results for input(s): LIPASE, AMYLASE in the last 168 hours. Recent Labs  Lab 03/03/17 1210 03/04/17 0718  AMMONIA 75* 45*   Coagulation Profile: Recent Labs  Lab 03/03/17 1221 03/04/17 0552  INR 1.18 1.32   Cardiac Enzymes: Recent Labs  Lab 03/03/17 1221  TROPONINI <0.03   BNP (last 3 results) No results for input(s): PROBNP in the last 8760 hours. HbA1C: No results for input(s): HGBA1C in the last 72 hours. CBG: Recent Labs  Lab 03/07/17 1730 03/07/17 2045 03/08/17 0631 03/08/17 1137 03/08/17 1650  GLUCAP 245* 222* 200* 248* 224*   Lipid Profile: No results for input(s): CHOL, HDL, LDLCALC, TRIG, CHOLHDL, LDLDIRECT in the last 72 hours. Thyroid Function Tests: Recent Labs    03/08/17 1221  TSH 1.228   Anemia Panel: No results for input(s): VITAMINB12, FOLATE, FERRITIN, TIBC, IRON, RETICCTPCT in the last 72 hours. Sepsis Labs: Recent Labs  Lab 03/03/17 1227 03/04/17 0718 03/05/17 0520  LATICACIDVEN 2.88* 2.7* 2.5*    Recent Results (from the past 240 hour(s))  Culture, blood (Routine x 2)     Status: None   Collection Time: 03/03/17 11:10 AM  Result  Value Ref Range Status   Specimen Description BLOOD RIGHT HAND  Final   Special Requests IN PEDIATRIC BOTTLE Blood Culture adequate volume  Final   Culture NO GROWTH 5 DAYS  Final   Report Status 03/08/2017 FINAL  Final  Culture, blood (Routine x 2)     Status: None   Collection Time: 03/03/17 12:21 PM  Result Value Ref Range Status   Specimen Description BLOOD LEFT HAND  Final   Special Requests IN PEDIATRIC BOTTLE Blood Culture adequate volume  Final   Culture NO GROWTH 5 DAYS  Final   Report Status 03/08/2017 FINAL  Final  Culture, body fluid-bottle     Status: None (Preliminary result)   Collection Time: 03/04/17 10:00 AM  Result Value Ref Range Status   Specimen Description PERITONEAL  Final   Special Requests NONE  Final   Culture NO GROWTH 4 DAYS  Final   Report Status PENDING  Incomplete  Gram stain     Status: None   Collection Time: 03/04/17 10:00 AM  Result Value Ref Range  Status   Specimen Description PERITONEAL  Final   Special Requests NONE  Final   Gram Stain   Final    RARE WBC PRESENT, PREDOMINANTLY MONONUCLEAR NO ORGANISMS SEEN    Report Status 03/04/2017 FINAL  Final  Culture, Urine     Status: Abnormal   Collection Time: 03/04/17 10:30 AM  Result Value Ref Range Status   Specimen Description URINE, CLEAN CATCH  Final   Special Requests NONE  Final   Culture MULTIPLE SPECIES PRESENT, SUGGEST RECOLLECTION (A)  Final   Report Status 03/05/2017 FINAL  Final         Radiology Studies: No results found.      Scheduled Meds: . calcitonin (salmon)  1 spray Alternating Nares Daily  . diltiazem  120 mg Oral Daily  . furosemide  20 mg Oral Daily  . Influenza vac split quadrivalent PF  0.5 mL Intramuscular Tomorrow-1000  . insulin aspart  0-15 Units Subcutaneous TID WC  . insulin aspart  0-5 Units Subcutaneous QHS  . lactulose  30 g Oral TID  . methocarbamol  500 mg Oral TID  . sodium chloride flush  3 mL Intravenous Q12H  . sodium chloride  2 g Oral  BID WC  . spironolactone  50 mg Oral Daily   Continuous Infusions: . sodium chloride       LOS: 5 days     Berle Mull, MD [facility] 03/08/2017, 6:35 PM  If 7PM-7AM, please contact night-coverage [email or web address] Password [facility] 03/08/2017, 6:35 PM

## 2017-03-08 NOTE — Progress Notes (Signed)
Patient ambulated in hallway with rolling walker for approximately 120 feet. RN provided stand by assistance only. Patient rated pain a 5/10 before, during, and after ambulation. Stated the pain did not change. Patient complained of increasing pain when getting back to bed.  Florian Buff, RN

## 2017-03-09 LAB — CULTURE, BODY FLUID-BOTTLE: CULTURE: NO GROWTH

## 2017-03-09 LAB — CBC
HEMATOCRIT: 31.8 % — AB (ref 36.0–46.0)
Hemoglobin: 10.5 g/dL — ABNORMAL LOW (ref 12.0–15.0)
MCH: 30.3 pg (ref 26.0–34.0)
MCHC: 33 g/dL (ref 30.0–36.0)
MCV: 91.6 fL (ref 78.0–100.0)
PLATELETS: 88 10*3/uL — AB (ref 150–400)
RBC: 3.47 MIL/uL — ABNORMAL LOW (ref 3.87–5.11)
RDW: 15.7 % — AB (ref 11.5–15.5)
WBC: 8.4 10*3/uL (ref 4.0–10.5)

## 2017-03-09 LAB — BASIC METABOLIC PANEL
Anion gap: 4 — ABNORMAL LOW (ref 5–15)
BUN: 14 mg/dL (ref 6–20)
CALCIUM: 7.7 mg/dL — AB (ref 8.9–10.3)
CO2: 23 mmol/L (ref 22–32)
CREATININE: 0.85 mg/dL (ref 0.44–1.00)
Chloride: 101 mmol/L (ref 101–111)
GFR calc non Af Amer: >60 mL/min (ref >60)
Glucose, Bld: 236 mg/dL — ABNORMAL HIGH (ref 65–99)
Potassium: 4 mmol/L (ref 3.5–5.1)
SODIUM: 128 mmol/L — AB (ref 135–145)

## 2017-03-09 LAB — CULTURE, BODY FLUID W GRAM STAIN -BOTTLE

## 2017-03-09 LAB — GLUCOSE, CAPILLARY
GLUCOSE-CAPILLARY: 249 mg/dL — AB (ref 65–99)
GLUCOSE-CAPILLARY: 298 mg/dL — AB (ref 65–99)
Glucose-Capillary: 222 mg/dL — ABNORMAL HIGH (ref 65–99)
Glucose-Capillary: 247 mg/dL — ABNORMAL HIGH (ref 65–99)

## 2017-03-09 MED ORDER — FUROSEMIDE 20 MG PO TABS
20.0000 mg | ORAL_TABLET | Freq: Two times a day (BID) | ORAL | Status: DC
  Administered 2017-03-09: 20 mg via ORAL
  Filled 2017-03-09: qty 1

## 2017-03-09 MED ORDER — FUROSEMIDE 10 MG/ML IJ SOLN
20.0000 mg | Freq: Two times a day (BID) | INTRAMUSCULAR | Status: DC
  Administered 2017-03-09 – 2017-03-10 (×2): 20 mg via INTRAVENOUS
  Filled 2017-03-09 (×3): qty 2

## 2017-03-09 NOTE — Progress Notes (Signed)
PROGRESS NOTE    Cynthia Long  JKK:[id number] DOB: 03/03/1941 DOA: 03/03/2017 PCP: Macon Large, MD   Brief Narrative: Cynthia Long is a 77 y.o. female with medical history significant of female,with history ofNonalcoholic steatohepatitis,diabetes mellitus,gastritis, esophageal varices. She presented with hepatic encephalopathy. Currently treating with lactulose.    Assessment & Plan:   Active Problems:   HTN (hypertension)   Chronic allergic bronchitis   Hepatic cirrhosis (HCC)   Ascites   Uncontrolled type 2 diabetes mellitus , without long-term current use of insulin (HCC)   Liver cirrhosis secondary to NASH (HCC)   Hepatic encephalopathy (HCC)   Thrombocytopenia (HCC)   Hepatic encephalopathy Ammonia elevated at 77 now improved. Patient alert. Continue Lactulose TID; dose increased today due to no BM today. Titrate for 2-3 soft bowel movements per day. -US paracentesis unremarkable No asterixis.  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 Carlean Purl as an outpatient. 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 Patient has had repeat paracenteses for ascites. -Continue lactulose, spironolactone Weight up 7 Lbs since admission.  Probably needs more lasix then standard 25:1 diuretic regimen. Pt was not on any lasix prior to admission  Resume oral Lasix, will need IV today, change to higher dose PO tomorrow  Hyponatremia and cirrhosis.  Corrected sodium 130. Fluid restricted diet. Recheck sodium tomorrow.   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 No evidence of bleeding. Stable.  Chronic allergic bronchitis Chronic cough which is stable.  -Continue albuterol and Pulmicort  History of diabetes mellitus Patient is diet controlled. Last A1C of 5.4. Using sliding scale insulin changing from sensitive to moderate.  Abnormal chest x-ray Possibly secondary to fluid overload. Given doxycycline in ED. Chronic cough. -follow  clinically  L2 compression fracture Orthopedic consulted. Recommended intranasal calcitonin and Valium for muscle spasm as well as MRI spine. Discussion with orthopedics, will switch her to schedule Robaxin.  Recommend to continue monitoring in the hospital, orthopedic recommends to consider kyphoplasty to family.  DVT prophylaxis: SCDs Code Status: DNR Family Communication: None at bedside Disposition Plan: When medically stable   Consultants:   None  Procedures:   Ultrasound paracentesis  Antimicrobials:  Doxycycline (03/03/17)    Subjective: Continues to have back pain although he still able to mobilize with that pain.  No nausea no vomiting.  Objective: Vitals:   03/08/17 1300 03/08/17 2021 03/09/17 0525 03/09/17 1009  BP: (!) 110/40 (!) 102/55 (!) 120/46   Pulse: 71 61 71   Resp: 16     Temp: 97.9 F (36.6 C) 97.6 F (36.4 C) 97.9 F (36.6 C)   TempSrc: Oral Oral Oral   SpO2: 100% 99% 98%   Weight:    64 kg (141 lb 3.2 oz)  Height:        Intake/Output Summary (Last 24 hours) at 03/09/2017 1126 Last data filed at 03/08/2017 1648 Gross per 24 hour  Intake -  Output 200 ml  Net -200 ml   Filed Weights   03/03/17 1041 03/08/17 0740 03/09/17 1009  Weight: 61.2 kg (135 lb) 64.7 kg (142 lb 10.2 oz) 64 kg (141 lb 3.2 oz)    Examination:  General exam: Appears calm and comfortable.  No asterixis Respiratory system: Clear to auscultation. Diminished. Respiratory effort normal. Cardiovascular system: S1 & S2 heard, RRR. 2/6 systolic murmur. Gastrointestinal system: Abdomen is significantly distended, soft and non-tender with reducible and non-tender umbilical hernia. Normal bowel sounds heard. Central nervous system: Alert and oriented to person and place. No  focal neurological deficits. Extremities: 2+ edema. No calf tenderness Skin: No cyanosis. No rashes Psychiatry: Judgement and insight appear stable. Mood & affect appropriate.     Data Reviewed: I  have personally reviewed following labs and imaging studies  CBC: Recent Labs  Lab 03/03/17 1221 03/04/17 0552 03/05/17 0520 03/07/17 0727 03/08/17 0551 03/09/17 0420  WBC 7.2 7.4 7.1 10.7* 9.0 8.4  NEUTROABS 5.8  --   --   --   --   --   HGB 13.2 11.7* 10.5* 11.1* 10.8* 10.5*  HCT 38.3 34.1* 31.3* 33.8* 32.4* 31.8*  MCV 93.2 92.4 93.2 92.1 91.8 91.6  PLT 63* 89* 86* 109* 92* 88*   Basic Metabolic Panel: Recent Labs  Lab 03/07/17 0727 03/08/17 0551 03/08/17 1221 03/08/17 1919 03/09/17 0420  NA 129* 127* 126* 124* 128*  K 4.6 4.4 4.4 4.0 4.0  CL 102 98* 97* 95* 101  CO2 20* 22 24 22 23   GLUCOSE 202* 217* 247* 271* 236*  BUN 14 14 14 13 14   CREATININE 0.94 0.77 0.77 0.88 0.85  CALCIUM 7.9* 7.9* 7.8* 7.5* 7.7*  MG 1.7  --   --   --   --    GFR: Estimated Creatinine Clearance: 51.1 mL/min (by C-G formula based on SCr of 0.85 mg/dL). Liver Function Tests: Recent Labs  Lab 03/03/17 1221 03/04/17 0552 03/05/17 0520  AST 53* 42* 39  ALT 30 25 23   ALKPHOS 215* 167* 146*  BILITOT 6.5* 7.0* 5.6*  PROT 6.0* 5.2* 4.6*  ALBUMIN 2.3* 2.0* 1.7*   No results for input(s): LIPASE, AMYLASE in the last 168 hours. Recent Labs  Lab 03/03/17 1210 03/04/17 0718  AMMONIA 75* 45*   Coagulation Profile: Recent Labs  Lab 03/03/17 1221 03/04/17 0552  INR 1.18 1.32   Cardiac Enzymes: Recent Labs  Lab 03/03/17 1221  TROPONINI <0.03   BNP (last 3 results) No results for input(s): PROBNP in the last 8760 hours. HbA1C: No results for input(s): HGBA1C in the last 72 hours. CBG: Recent Labs  Lab 03/08/17 0631 03/08/17 1137 03/08/17 1650 03/08/17 2025 03/09/17 0606  GLUCAP 200* 248* 224* 248* 222*   Lipid Profile: No results for input(s): CHOL, HDL, LDLCALC, TRIG, CHOLHDL, LDLDIRECT in the last 72 hours. Thyroid Function Tests: Recent Labs    03/08/17 1221  TSH 1.228   Anemia Panel: No results for input(s): VITAMINB12, FOLATE, FERRITIN, TIBC, IRON, RETICCTPCT  in the last 72 hours. Sepsis Labs: Recent Labs  Lab 03/03/17 1227 03/04/17 0718 03/05/17 0520  LATICACIDVEN 2.88* 2.7* 2.5*    Recent Results (from the past 240 hour(s))  Culture, blood (Routine x 2)     Status: None   Collection Time: 03/03/17 11:10 AM  Result Value Ref Range Status   Specimen Description BLOOD RIGHT HAND  Final   Special Requests IN PEDIATRIC BOTTLE Blood Culture adequate volume  Final   Culture NO GROWTH 5 DAYS  Final   Report Status 03/08/2017 FINAL  Final  Culture, blood (Routine x 2)     Status: None   Collection Time: 03/03/17 12:21 PM  Result Value Ref Range Status   Specimen Description BLOOD LEFT HAND  Final   Special Requests IN PEDIATRIC BOTTLE Blood Culture adequate volume  Final   Culture NO GROWTH 5 DAYS  Final   Report Status 03/08/2017 FINAL  Final  Culture, body fluid-bottle     Status: None (Preliminary result)   Collection Time: 03/04/17 10:00 AM  Result Value Ref Range  Status   Specimen Description PERITONEAL  Final   Special Requests NONE  Final   Culture NO GROWTH 4 DAYS  Final   Report Status PENDING  Incomplete  Gram stain     Status: None   Collection Time: 03/04/17 10:00 AM  Result Value Ref Range Status   Specimen Description PERITONEAL  Final   Special Requests NONE  Final   Gram Stain   Final    RARE WBC PRESENT, PREDOMINANTLY MONONUCLEAR NO ORGANISMS SEEN    Report Status 03/04/2017 FINAL  Final  Culture, Urine     Status: Abnormal   Collection Time: 03/04/17 10:30 AM  Result Value Ref Range Status   Specimen Description URINE, CLEAN CATCH  Final   Special Requests NONE  Final   Culture MULTIPLE SPECIES PRESENT, SUGGEST RECOLLECTION (A)  Final   Report Status 03/05/2017 FINAL  Final         Radiology Studies: No results found.      Scheduled Meds: . calcitonin (salmon)  1 spray Alternating Nares Daily  . diltiazem  120 mg Oral Daily  . furosemide  20 mg Intravenous BID  . Influenza vac split quadrivalent  PF  0.5 mL Intramuscular Tomorrow-1000  . insulin aspart  0-15 Units Subcutaneous TID WC  . insulin aspart  0-5 Units Subcutaneous QHS  . lactulose  30 g Oral TID  . methocarbamol  500 mg Oral TID  . sodium chloride flush  3 mL Intravenous Q12H  . sodium chloride  2 g Oral BID WC  . spironolactone  50 mg Oral Daily   Continuous Infusions: . sodium chloride       LOS: 6 days     Berle Mull, MD [facility] 03/09/2017, 11:26 AM  If 7PM-7AM, please contact night-coverage [email or web address] Password [email or web address] 03/09/2017, 11:26 AM

## 2017-03-10 LAB — BASIC METABOLIC PANEL
ANION GAP: 8 (ref 5–15)
ANION GAP: 8 (ref 5–15)
BUN: 16 mg/dL (ref 6–20)
BUN: 15 mg/dL (ref 6–20)
CALCIUM: 7.8 mg/dL — AB (ref 8.9–10.3)
CALCIUM: 7.5 mg/dL — AB (ref 8.9–10.3)
CO2: 21 mmol/L — ABNORMAL LOW (ref 22–32)
CO2: 20 mmol/L — ABNORMAL LOW (ref 22–32)
CREATININE: 0.91 mg/dL (ref 0.44–1.00)
Chloride: 97 mmol/L — ABNORMAL LOW (ref 101–111)
Chloride: 98 mmol/L — ABNORMAL LOW (ref 101–111)
Creatinine, Ser: 0.81 mg/dL (ref 0.44–1.00)
GFR calc non Af Amer: >60 mL/min (ref >60)
GLUCOSE: 319 mg/dL — AB (ref 65–99)
Glucose, Bld: 305 mg/dL — ABNORMAL HIGH (ref 65–99)
POTASSIUM: 3.8 mmol/L (ref 3.5–5.1)
Potassium: 3.9 mmol/L (ref 3.5–5.1)
Sodium: 126 mmol/L — ABNORMAL LOW (ref 135–145)
Sodium: 126 mmol/L — ABNORMAL LOW (ref 135–145)

## 2017-03-10 LAB — CBC
HCT: 32.6 % — ABNORMAL LOW (ref 36.0–46.0)
Hemoglobin: 10.9 g/dL — ABNORMAL LOW (ref 12.0–15.0)
MCH: 30.7 pg (ref 26.0–34.0)
MCHC: 33.4 g/dL (ref 30.0–36.0)
MCV: 91.8 fL (ref 78.0–100.0)
PLATELETS: 106 10*3/uL — AB (ref 150–400)
RBC: 3.55 MIL/uL — ABNORMAL LOW (ref 3.87–5.11)
RDW: 16.1 % — AB (ref 11.5–15.5)
WBC: 9.4 10*3/uL (ref 4.0–10.5)

## 2017-03-10 LAB — PROTIME-INR
INR: 1.37
PROTHROMBIN TIME: 16.7 s — AB (ref 11.4–15.2)

## 2017-03-10 LAB — GLUCOSE, CAPILLARY
GLUCOSE-CAPILLARY: 239 mg/dL — AB (ref 65–99)
GLUCOSE-CAPILLARY: 226 mg/dL — AB (ref 65–99)
Glucose-Capillary: 206 mg/dL — ABNORMAL HIGH (ref 65–99)

## 2017-03-10 LAB — HEMOGLOBIN A1C
HEMOGLOBIN A1C: 4.8 % (ref 4.8–5.6)
MEAN PLASMA GLUCOSE: 91.06 mg/dL

## 2017-03-10 MED ORDER — SODIUM CHLORIDE 0.9 % IV SOLN
1.0000 g | Freq: Once | INTRAVENOUS | Status: AC
  Administered 2017-03-10: 1 g via INTRAVENOUS
  Filled 2017-03-10: qty 10

## 2017-03-10 MED ORDER — FUROSEMIDE 40 MG PO TABS
60.0000 mg | ORAL_TABLET | Freq: Every day | ORAL | Status: DC
  Administered 2017-03-11 – 2017-03-13 (×3): 60 mg via ORAL
  Filled 2017-03-10 (×4): qty 1

## 2017-03-10 MED ORDER — INSULIN GLARGINE 100 UNIT/ML ~~LOC~~ SOLN
8.0000 [IU] | Freq: Every day | SUBCUTANEOUS | Status: DC
  Administered 2017-03-10 – 2017-03-13 (×4): 8 [IU] via SUBCUTANEOUS
  Filled 2017-03-10 (×4): qty 0.08

## 2017-03-10 NOTE — Clinical Social Work Note (Signed)
Clinical Social Work Assessment  Patient Details  Name: Cynthia Long MRN: [id number] Date of Birth: Feb 14, 1941  Date of referral:  03/10/17               Reason for consult:  Facility Placement                Permission sought to share information with:  Chartered certified accountant granted to share information::  Yes, Verbal Permission Granted  Name::     Cynthia Long  Agency::  SNF  Relationship::  son  Contact Information:     Housing/Transportation Living arrangements for the past 2 months:  Single Family Home Source of Information:  Patient, Adult Children Patient Interpreter Needed:  None Criminal Activity/Legal Involvement Pertinent to Current Situation/Hospitalization:  No - Comment as needed Significant Relationships:  Adult Children, Other Family Members Lives with:  Self Do you feel safe going back to the place where you live?  No Need for family participation in patient care:  Yes (Comment)  Care giving concerns:  CSW met with son and son-n-law to discuss disposition. Family desires patient to go to SNF, despite therapy recommending HH.  Family wants Cynthia Long and that SNF is not in the [facility]. CSW was advised by Asst. Interior and spatial designer that CSW cannot sent clinicals via fax. CSW discussed with family who is unpleased. CSW will f/u with other SNF's in [place].  Pending decision on another medical procedure.  Social Worker assessment / plan:  CSW will f/u for SNF placement and discuss SNF offers with family.   Employment status:  Retired Forensic scientist:  Medicare PT Recommendations:  [place] / Referral to community resources:  [place]  Patient/Family's Response to care:  Family appreciative of CSW assisting however, not pleased that CSW cannot assist with getting patient to [facility] SNF. No other issues or concerns.   Patient/Family's Understanding of and Emotional Response to Diagnosis,  Current Treatment, and Prognosis:  Family understands patient condition however despite therapy recommending home services, family still desires SNF so that patient can get the care she needs and return home to baseline.  CSW discussed barriers with getting patient to [facility] and family not pleased however understands the process. No other issues or concerns.  Emotional Assessment Appearance:  Appears stated age Attitude/Demeanor/Rapport:  (Cooperative) Affect (typically observed):  Accepting, Appropriate Orientation:  Oriented to Self, Oriented to Place, Oriented to  Time, Oriented to Situation Alcohol / Substance use:  Not Applicable Psych involvement (Current and /or in the community):  No (Comment)  Discharge Needs  Concerns to be addressed:  Discharge Planning Concerns Readmission within the last 30 days:  Yes Current discharge risk:  Dependent with Mobility, Physical Impairment, Lives alone Barriers to Discharge:  No Barriers Identified   Cynthia Baxter, LCSW 03/10/2017, 11:24 AM

## 2017-03-10 NOTE — Progress Notes (Signed)
    Subjective:    Patient reports pain as 5 on 0-10 scale.   Denies CP or SOB.  Voiding without difficulty. Positive flatus. Objective: Vital signs in last 24 hours: Temp:  [97.5 F (36.4 C)-97.7 F (36.5 C)] 97.5 F (36.4 C) (01/07 1354) Pulse Rate:  [61-74] 74 (01/07 1354) Resp:  [18-20] 20 (01/07 1354) BP: (120-130)/(37-51) 130/51 (01/07 1354) SpO2:  [97 %-100 %] 100 % (01/07 1354) Weight:  [64 kg (141 lb 1.5 oz)] 64 kg (141 lb 1.5 oz) (01/07 1500)  Intake/Output from previous day: No intake/output data recorded. Intake/Output this shift: Total I/O In: 110 [P.O.:110] Out: -   Labs: Recent Labs    03/08/17 0551 03/09/17 0420 03/10/17 0924  HGB 10.8* 10.5* 10.9*   Recent Labs    03/09/17 0420 03/10/17 0924  WBC 8.4 9.4  RBC 3.47* 3.55*  HCT 31.8* 32.6*  PLT 88* 106*   Recent Labs    03/10/17 0924 03/10/17 1542  NA 126* 126*  K 3.9 3.8  CL 97* 98*  CO2 21* 20*  BUN 16 15  CREATININE 0.91 0.81  GLUCOSE 319* 305*  CALCIUM 7.8* 7.5*   No results for input(s): LABPT, INR in the last 72 hours.  Physical Exam: Neurologically intact Intact pulses distally Compartment soft Body mass index is 25.81 kg/m.   Assessment/Plan:    Patient continues to have pain with mobilization Plan on moving forward with L1 and L2 kyphoplasty Medically cleared per Dr Posey Pronto.  Will plan on surgery Wednesday Will need SNF post-op for rehab. Expected d/c Thursday  .   Risks and benefits explained Patient's platelet count is 106.  I would like to see that at least above 150 and so more than likely we will use platelets prior to surgery.  Risks: Infection, bleeding, death, stroke, paralysis, ongoing or worse pain, leak of cement, need for additional surgery including decompression.  Goal of surgery is to alleviate or reduce her pain in order to improve her quality of life.  Melina Schools D for Dr. Melina Schools [facility] [phone] 03/10/2017, 5:23  PM

## 2017-03-10 NOTE — Social Work (Addendum)
CSW met with son and son-n-law about SNF placement.  Family desires [facility], however our administrator will not allow CSW to send clinicals via fax as SNF is not in the [facility] electronic hub and we are no longer able to work with that SNF.  Family is not pleased and are not sure if they like the other SNF's however are open to see if the other [facility] SNF's will offer a bed.  CSW explained that family select SNF and try to get patient moved to more desireable SNF.  CSW also, advised that patient could go home with home services and then have home agency assist with getting patient to SNF.  Family concerns about the multiple transitions and really want patient to go to the SNF that they desire initially.   CSW also left message for Alyse Low in admission at [facility] to make abreast of the referral and our barrier.    CSW also returned call to Manuela Schwartz at [facility] to see if they can offer.  2:20pm: CSW called Myriam Jacobson at [facility] and she indicated she will review and call CSW back to discsuss.  Elissa Hefty, LCSW Clinical Social Worker [phone]

## 2017-03-10 NOTE — Progress Notes (Signed)
Physical Therapy Treatment Patient Details Name: Cynthia Long MRN: [id number] DOB: 1940/07/09 Today's Date: 03/10/2017    History of Present Illness Pt is a 61 y/ofemalewith a PMH ofnonalcoholic steatohepatitis,DM,gastritis, esophageal varices.She presented to the emergency department secondary to 3-day history of worsening confusion. Dx hepatic encephalopathy, L1 and L2 compression fxs.    PT Comments    Patient is making progress toward mobility goals. Continues to c/o back pain and required mod A for bed mobility. Pt tolerated gait well with min guard A for safety. Continue to progress as tolerated.    Follow Up Recommendations  Home health PT;Supervision/Assistance - 24 hour     Equipment Recommendations  None recommended by PT    Recommendations for Other Services       Precautions / Restrictions Precautions Precautions: Fall Restrictions Weight Bearing Restrictions: No    Mobility  Bed Mobility Overal bed mobility: Needs Assistance Bed Mobility: Rolling;Sidelying to Sit Rolling: Mod assist Sidelying to sit: Mod assist       General bed mobility comments: cues for sequencing and technique  Transfers Overall transfer level: Needs assistance Equipment used: Rolling walker (2 wheeled) Transfers: Sit to/from Stand Sit to Stand: Min assist         General transfer comment: assist to power up into standing; cues for safe hand placement  Ambulation/Gait Ambulation/Gait assistance: Min guard Ambulation Distance (Feet): 130 Feet Assistive device: Rolling walker (2 wheeled) Gait Pattern/deviations: Step-through pattern;Decreased stride length Gait velocity: Decreased   General Gait Details: cues for posture; bilat LE weakness but no knee buckling or LOB   Stairs            Wheelchair Mobility    Modified Rankin (Stroke Patients Only)       Balance Overall balance assessment: Needs assistance Sitting-balance support: Feet supported;No upper  extremity supported Sitting balance-Leahy Scale: Fair     Standing balance support: Bilateral upper extremity supported;During functional activity Standing balance-Leahy Scale: Poor Standing balance comment: Reliant on RW for support                            Cognition Arousal/Alertness: Awake/alert Behavior During Therapy: Flat affect Overall Cognitive Status: Within Functional Limits for tasks assessed                                        Exercises      General Comments        Pertinent Vitals/Pain Pain Assessment: Faces Faces Pain Scale: Hurts little more Pain Location: low back with movement Pain Descriptors / Indicators: Guarding;Discomfort;Sore Pain Intervention(s): Limited activity within patient's tolerance;Monitored during session;Premedicated before session;Repositioned    Home Living                      Prior Function            PT Goals (current goals can now be found in the care plan section) Acute Rehab PT Goals Patient Stated Goal: Improve back pain PT Goal Formulation: With patient Time For Goal Achievement: 03/18/17 Potential to Achieve Goals: Fair Progress towards PT goals: Progressing toward goals    Frequency    Min 3X/week      PT Plan Current plan remains appropriate    Co-evaluation              AM-PAC PT "6 Clicks" Daily  Activity  Outcome Measure  Difficulty turning over in bed (including adjusting bedclothes, sheets and blankets)?: Unable Difficulty moving from lying on back to sitting on the side of the bed? : Unable Difficulty sitting down on and standing up from a chair with arms (e.g., wheelchair, bedside commode, etc,.)?: Unable Help needed moving to and from a bed to chair (including a wheelchair)?: A Little Help needed walking in hospital room?: A Little Help needed climbing 3-5 steps with a railing? : A Lot 6 Click Score: 11    End of Session Equipment Utilized During  Treatment: Gait belt Activity Tolerance: Patient tolerated treatment well Patient left: with call bell/phone within reach;in chair;with family/visitor present Nurse Communication: Mobility status PT Visit Diagnosis: Unsteadiness on feet (R26.81);Pain;Other abnormalities of gait and mobility (R26.89) Pain - Right/Left: (Bilateral low back) Pain - part of body: (back)     Time: 5277-8242 PT Time Calculation (min) (ACUTE ONLY): 19 min  Charges:  $Gait Training: 8-22 mins                    G Codes:       Earney Navy, PTA Pager: [phone]     Darliss Cheney 03/10/2017, 10:46 AM

## 2017-03-10 NOTE — NC FL2 (Signed)
[place] LEVEL OF CARE SCREENING TOOL     IDENTIFICATION  Patient Name: Cynthia Long Birthdate: April 08, 1940 Sex: female Admission Date (Current Location): 03/03/2017  [place] and [place] Number:  ([place])   Facility and Address:  [facility] [place], [place] [address], [place], [place] [address]      Provider Number: [id number]  Attending Physician Name and Address:  Lavina Hamman, MD  Relative Name and Phone Number:  Brystol Wasilewski, son, [phone]    Current Level of Care: Hospital Recommended Level of Care: [place] Prior Approval Number:    Date Approved/Denied:   PASRR Number:    Discharge Plan: SNF    Current Diagnoses: Patient Active Problem List   Diagnosis Date Noted  . Hepatic encephalopathy ([place]) 03/03/2017  . Thrombocytopenia ([place]) 03/03/2017  . Liver cirrhosis secondary to NASH ([place]) 01/06/2017  . Abnormal MRI, liver 11/15/2016  . Uncontrolled type 2 diabetes mellitus , without long-term current use of insulin ([place]) 11/15/2016  . Hepatic cirrhosis ([place]) 09/15/2013  . Ascites 09/15/2013  . HTN (hypertension) 08/13/2013  . Chronic allergic bronchitis 08/13/2013    Orientation RESPIRATION BLADDER Height & Weight     Self, Time, Situation, Place  Normal Continent Weight: 141 lb 3.2 oz (64 kg) Height:  5\' 2"  (157.5 cm)  BEHAVIORAL SYMPTOMS/MOOD NEUROLOGICAL BOWEL NUTRITION STATUS      Continent Diet(See DC Summary)  AMBULATORY STATUS COMMUNICATION OF NEEDS Skin   Limited Assist Verbally Surgical wounds                       Personal Care Assistance Level of Assistance  Bathing, Dressing, Feeding Bathing Assistance: Limited assistance Feeding assistance: Limited assistance Dressing Assistance: Limited assistance     Functional Limitations Info  Sight, Hearing, Speech Sight Info: Adequate Hearing Info: Adequate Speech Info: Adequate    SPECIAL CARE FACTORS FREQUENCY  PT (By licensed  PT)     PT Frequency: 5x week              Contractures Contractures Info: Not present    Additional Factors Info  Code Status, Allergies, Insulin Sliding Scale Code Status Info: DNR Allergies Info: BIAXIN CLARITHROMYCIN, SULFA ANTIBIOTICS    Insulin Sliding Scale Info: Insulin Daily       Current Medications (03/10/2017):  This is the current hospital active medication list Current Facility-Administered Medications  Medication Dose Route Frequency Provider Last Rate Last Dose  . 0.9 %  sodium chloride infusion  250 mL Intravenous PRN Mariel Aloe, MD      . acetaminophen (TYLENOL) tablet 650 mg  650 mg Oral BID PRN Mariel Aloe, MD   650 mg at 03/08/17 1159  . albuterol (PROVENTIL) (2.5 MG/3ML) 0.083% nebulizer solution 3 mL  3 mL Inhalation Q6H PRN Mariel Aloe, MD      . budesonide (PULMICORT) nebulizer solution 0.5 mg  0.5 mg Nebulization Daily PRN Mariel Aloe, MD      . calcitonin (salmon) (MIACALCIN/FORTICAL) nasal spray 1 spray  1 spray Alternating Nares Daily Melina Schools, MD   1 spray at 03/10/17 1035  . diltiazem (CARDIZEM CD) 24 hr capsule 120 mg  120 mg Oral Daily Mariel Aloe, MD   120 mg at 03/10/17 1033  . furosemide (LASIX) injection 20 mg  20 mg Intravenous BID Lavina Hamman, MD   20 mg at 03/10/17 1034  . Influenza vac split quadrivalent PF (FLUZONE HIGH-DOSE) injection  0.5 mL  0.5 mL Intramuscular Tomorrow-1000 Cordelia Poche A, MD      . insulin aspart (novoLOG) injection 0-15 Units  0-15 Units Subcutaneous TID WC Lavina Hamman, MD   5 Units at 03/10/17 0751  . insulin aspart (novoLOG) injection 0-5 Units  0-5 Units Subcutaneous QHS Lavina Hamman, MD   2 Units at 03/09/17 2034  . insulin glargine (LANTUS) injection 8 Units  8 Units Subcutaneous Daily Lavina Hamman, MD      . lactulose (CHRONULAC) 10 GM/15ML solution 30 g  30 g Oral TID Lavina Hamman, MD   30 g at 03/10/17 1034  . methocarbamol (ROBAXIN) tablet 500 mg  500 mg Oral TID  Lavina Hamman, MD   500 mg at 03/10/17 1033  . ondansetron (ZOFRAN-ODT) disintegrating tablet 4 mg  4 mg Oral Q6H PRN Mariel Aloe, MD      . sodium chloride flush (NS) 0.9 % injection 3 mL  3 mL Intravenous Q12H Mariel Aloe, MD   3 mL at 03/10/17 1034  . sodium chloride flush (NS) 0.9 % injection 3 mL  3 mL Intravenous PRN Mariel Aloe, MD      . sodium chloride tablet 2 g  2 g Oral BID WC Lavina Hamman, MD   2 g at 03/10/17 1034  . spironolactone (ALDACTONE) tablet 50 mg  50 mg Oral Daily Mariel Aloe, MD   50 mg at 03/10/17 1033  . traMADol (ULTRAM) tablet 50 mg  50 mg Oral Q6H PRN Lavina Hamman, MD   50 mg at 03/09/17 1631     Discharge Medications: Please see discharge summary for a list of discharge medications.  Relevant Imaging Results:  Relevant Lab Results:   Additional Information SS#: [id number]  [place], LCSW

## 2017-03-10 NOTE — Progress Notes (Signed)
PROGRESS NOTE    Cynthia Long  WVP:[id number] DOB: 08-10-1940 DOA: 03/03/2017 PCP: Macon Large, MD   Brief Narrative: Cynthia Long is a 77 y.o. female with medical history significant of female,with history ofNonalcoholic steatohepatitis,diabetes mellitus,gastritis, esophageal varices. She presented with hepatic encephalopathy. Currently treating with lactulose.    Assessment & Plan:   Active Problems:   HTN (hypertension)   Chronic allergic bronchitis   Hepatic cirrhosis (HCC)   Ascites   Uncontrolled type 2 diabetes mellitus , without long-term current use of insulin (HCC)   Liver cirrhosis secondary to NASH (HCC)   Hepatic encephalopathy (HCC)   Thrombocytopenia (HCC)   Hepatic encephalopathy Ammonia elevated at 77 now improved. Patient alert. Continue Lactulose TID; tolerating the increased dose. Titrate for 2-3 soft bowel movements per day. -US paracentesis unremarkable No asterixis.  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 Carlean Purl as an outpatient. 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 Patient has had repeat paracenteses for ascites. -Continue lactulose, spironolactone Weight up 7 Lbs since admission.  Probably needs more lasix then standard 25:1 diuretic regimen. Pt was not on any lasix prior to admission  Resuming oral Lasix tomorrow.  60 mg daily.  Hyponatremia and cirrhosis.  Corrected sodium 131 based on glucose level on 03/10/2017. Fluid restricted diet. Recheck sodium tomorrow.   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 No evidence of bleeding. Stable. Surgery once platelets to be more than 150.  Will get transfusion.  Chronic allergic bronchitis Chronic cough which is stable.  -Continue albuterol and Pulmicort  History of diabetes mellitus Patient is diet controlled. Last A1C of 5.4. Using sliding scale insulin changing from sensitive to moderate.  Abnormal chest x-ray Possibly secondary to fluid overload.  Given doxycycline in ED. Chronic cough. -follow clinically  L2 compression fracture Orthopedic consulted. Recommended intranasal calcitonin and Valium for muscle spasm as well as MRI spine. Discussion with orthopedics, continue scheduled Robaxin. While the patient continues to complain about having back pain when asked whether the medications worked to control the pain and she mentions it does. Plan is to perform L1 and L2 kyphoplasty tomorrow.  Thrombocytopenia is a limitation. Check INR as well.  DVT prophylaxis: SCDs Code Status: DNR Family Communication: None at bedside Disposition Plan: When medically stable   Consultants:   None  Procedures:   Ultrasound paracentesis  Antimicrobials:  Doxycycline (03/03/17)    Subjective: Unchanged back pain.  No nausea no vomiting.  No asterixis.  Objective: Vitals:   03/10/17 0547 03/10/17 1033 03/10/17 1354 03/10/17 1500  BP: (!) 120/42 (!) 122/40 (!) 130/51   Pulse: 72  74   Resp: 18  20   Temp: 97.6 F (36.4 C)  (!) 97.5 F (36.4 C)   TempSrc: Oral  Oral   SpO2: 97%  100%   Weight:    64 kg (141 lb 1.5 oz)  Height:        Intake/Output Summary (Last 24 hours) at 03/10/2017 1751 Last data filed at 03/10/2017 1700 Gross per 24 hour  Intake 370 ml  Output -  Net 370 ml   Filed Weights   03/08/17 0740 03/09/17 1009 03/10/17 1500  Weight: 64.7 kg (142 lb 10.2 oz) 64 kg (141 lb 3.2 oz) 64 kg (141 lb 1.5 oz)    Examination:  General exam: Appears calm and comfortable.  No asterixis Respiratory system: Clear to auscultation. Diminished. Respiratory effort normal. Cardiovascular system: S1 & S2 heard, RRR. 2/6 systolic murmur. Gastrointestinal system: Abdomen is  significantly distended, soft and non-tender with reducible and non-tender umbilical hernia. Normal bowel sounds heard. Central nervous system: Alert and oriented to person and place. No focal neurological deficits. Extremities: 2+ edema. No calf  tenderness Skin: No cyanosis. No rashes Psychiatry: Judgement and insight appear stable. Mood & affect appropriate.     Data Reviewed: I have personally reviewed following labs and imaging studies  CBC: Recent Labs  Lab 03/05/17 0520 03/07/17 0727 03/08/17 0551 03/09/17 0420 03/10/17 0924  WBC 7.1 10.7* 9.0 8.4 9.4  HGB 10.5* 11.1* 10.8* 10.5* 10.9*  HCT 31.3* 33.8* 32.4* 31.8* 32.6*  MCV 93.2 92.1 91.8 91.6 91.8  PLT 86* 109* 92* 88* 245*   Basic Metabolic Panel: Recent Labs  Lab 03/07/17 0727  03/08/17 1221 03/08/17 1919 03/09/17 0420 03/10/17 0924 03/10/17 1542  NA 129*   < > 126* 124* 128* 126* 126*  K 4.6   < > 4.4 4.0 4.0 3.9 3.8  CL 102   < > 97* 95* 101 97* 98*  CO2 20*   < > 24 22 23  21* 20*  GLUCOSE 202*   < > 247* 271* 236* 319* 305*  BUN 14   < > 14 13 14 16 15   CREATININE 0.94   < > 0.77 0.88 0.85 0.91 0.81  CALCIUM 7.9*   < > 7.8* 7.5* 7.7* 7.8* 7.5*  MG 1.7  --   --   --   --   --   --    < > = values in this interval not displayed.   GFR: Estimated Creatinine Clearance: 53.6 mL/min (by C-G formula based on SCr of 0.81 mg/dL). Liver Function Tests: Recent Labs  Lab 03/04/17 0552 03/05/17 0520  AST 42* 39  ALT 25 23  ALKPHOS 167* 146*  BILITOT 7.0* 5.6*  PROT 5.2* 4.6*  ALBUMIN 2.0* 1.7*   No results for input(s): LIPASE, AMYLASE in the last 168 hours. Recent Labs  Lab 03/04/17 0718  AMMONIA 45*   Coagulation Profile: Recent Labs  Lab 03/04/17 0552  INR 1.32   Cardiac Enzymes: No results for input(s): CKTOTAL, CKMB, CKMBINDEX, TROPONINI in the last 168 hours. BNP (last 3 results) No results for input(s): PROBNP in the last 8760 hours. HbA1C: Recent Labs    03/10/17 1542  HGBA1C 4.8   CBG: Recent Labs  Lab 03/09/17 1127 03/09/17 1647 03/09/17 2026 03/10/17 0551 03/10/17 1700  GLUCAP 249* 298* 247* 239* 226*   Lipid Profile: No results for input(s): CHOL, HDL, LDLCALC, TRIG, CHOLHDL, LDLDIRECT in the last 72  hours. Thyroid Function Tests: Recent Labs    03/08/17 1221  TSH 1.228   Anemia Panel: No results for input(s): VITAMINB12, FOLATE, FERRITIN, TIBC, IRON, RETICCTPCT in the last 72 hours. Sepsis Labs: Recent Labs  Lab 03/04/17 0718 03/05/17 0520  LATICACIDVEN 2.7* 2.5*    Recent Results (from the past 240 hour(s))  Culture, blood (Routine x 2)     Status: None   Collection Time: 03/03/17 11:10 AM  Result Value Ref Range Status   Specimen Description BLOOD RIGHT HAND  Final   Special Requests IN PEDIATRIC BOTTLE Blood Culture adequate volume  Final   Culture NO GROWTH 5 DAYS  Final   Report Status 03/08/2017 FINAL  Final  Culture, blood (Routine x 2)     Status: None   Collection Time: 03/03/17 12:21 PM  Result Value Ref Range Status   Specimen Description BLOOD LEFT HAND  Final   Special Requests IN PEDIATRIC  BOTTLE Blood Culture adequate volume  Final   Culture NO GROWTH 5 DAYS  Final   Report Status 03/08/2017 FINAL  Final  Culture, body fluid-bottle     Status: None   Collection Time: 03/04/17 10:00 AM  Result Value Ref Range Status   Specimen Description PERITONEAL  Final   Special Requests NONE  Final   Culture NO GROWTH 5 DAYS  Final   Report Status 03/09/2017 FINAL  Final  Gram stain     Status: None   Collection Time: 03/04/17 10:00 AM  Result Value Ref Range Status   Specimen Description PERITONEAL  Final   Special Requests NONE  Final   Gram Stain   Final    RARE WBC PRESENT, PREDOMINANTLY MONONUCLEAR NO ORGANISMS SEEN    Report Status 03/04/2017 FINAL  Final  Culture, Urine     Status: Abnormal   Collection Time: 03/04/17 10:30 AM  Result Value Ref Range Status   Specimen Description URINE, CLEAN CATCH  Final   Special Requests NONE  Final   Culture MULTIPLE SPECIES PRESENT, SUGGEST RECOLLECTION (A)  Final   Report Status 03/05/2017 FINAL  Final         Radiology Studies: No results found.      Scheduled Meds: . calcitonin (salmon)  1  spray Alternating Nares Daily  . diltiazem  120 mg Oral Daily  . furosemide  20 mg Intravenous BID  . Influenza vac split quadrivalent PF  0.5 mL Intramuscular Tomorrow-1000  . insulin aspart  0-15 Units Subcutaneous TID WC  . insulin aspart  0-5 Units Subcutaneous QHS  . insulin glargine  8 Units Subcutaneous Daily  . lactulose  30 g Oral TID  . methocarbamol  500 mg Oral TID  . sodium chloride flush  3 mL Intravenous Q12H  . sodium chloride  2 g Oral BID WC  . spironolactone  50 mg Oral Daily   Continuous Infusions: . sodium chloride       LOS: 7 days     Berle Mull, MD [facility] 03/10/2017, 5:51 PM  If 7PM-7AM, please contact night-coverage [email or web address] Password [facility] 03/10/2017, 5:51 PM

## 2017-03-11 ENCOUNTER — Encounter (HOSPITAL_COMMUNITY): Payer: Self-pay | Admitting: *Deleted

## 2017-03-11 LAB — CBC
HCT: 29.7 % — ABNORMAL LOW (ref 36.0–46.0)
Hemoglobin: 9.9 g/dL — ABNORMAL LOW (ref 12.0–15.0)
MCH: 30.6 pg (ref 26.0–34.0)
MCHC: 33.3 g/dL (ref 30.0–36.0)
MCV: 91.7 fL (ref 78.0–100.0)
PLATELETS: 92 10*3/uL — AB (ref 150–400)
RBC: 3.24 MIL/uL — ABNORMAL LOW (ref 3.87–5.11)
RDW: 16.3 % — AB (ref 11.5–15.5)
WBC: 7.3 10*3/uL (ref 4.0–10.5)

## 2017-03-11 LAB — BASIC METABOLIC PANEL
ANION GAP: 8 (ref 5–15)
BUN: 14 mg/dL (ref 6–20)
CO2: 21 mmol/L — AB (ref 22–32)
CREATININE: 0.73 mg/dL (ref 0.44–1.00)
Calcium: 7.9 mg/dL — ABNORMAL LOW (ref 8.9–10.3)
Chloride: 99 mmol/L — ABNORMAL LOW (ref 101–111)
GFR calc Af Amer: >60 mL/min (ref >60)
GLUCOSE: 185 mg/dL — AB (ref 65–99)
Potassium: 3.8 mmol/L (ref 3.5–5.1)
Sodium: 128 mmol/L — ABNORMAL LOW (ref 135–145)

## 2017-03-11 LAB — GLUCOSE, CAPILLARY
Glucose-Capillary: 181 mg/dL — ABNORMAL HIGH (ref 65–99)
Glucose-Capillary: 183 mg/dL — ABNORMAL HIGH (ref 65–99)
Glucose-Capillary: 234 mg/dL — ABNORMAL HIGH (ref 65–99)
Glucose-Capillary: 184 mg/dL — ABNORMAL HIGH (ref 65–99)

## 2017-03-11 MED ORDER — PANTOPRAZOLE SODIUM 40 MG PO TBEC: 40.0000 mg | DELAYED_RELEASE_TABLET | Freq: Two times a day (BID) | ORAL | Status: DC

## 2017-03-11 MED ORDER — PANTOPRAZOLE SODIUM 40 MG PO TBEC
40.0000 mg | DELAYED_RELEASE_TABLET | Freq: Two times a day (BID) | ORAL | Status: DC
  Administered 2017-03-11 – 2017-03-13 (×4): 40 mg via ORAL
  Filled 2017-03-11 (×5): qty 1

## 2017-03-11 MED ORDER — SPIRONOLACTONE 25 MG PO TABS
50.0000 mg | ORAL_TABLET | Freq: Once | ORAL | Status: AC
  Administered 2017-03-11: 50 mg via ORAL
  Filled 2017-03-11: qty 2

## 2017-03-11 MED ORDER — SODIUM CHLORIDE 0.9 % IV SOLN: Freq: Once | INTRAVENOUS | Status: DC

## 2017-03-11 MED ORDER — SPIRONOLACTONE 25 MG PO TABS
100.0000 mg | ORAL_TABLET | Freq: Every day | ORAL | Status: DC
  Administered 2017-03-13: 100 mg via ORAL
  Filled 2017-03-11 (×2): qty 4

## 2017-03-11 MED ORDER — CEFAZOLIN SODIUM-DEXTROSE 2-4 GM/100ML-% IV SOLN
2.0000 g | INTRAVENOUS | Status: AC
  Administered 2017-03-12: 2 g via INTRAVENOUS
  Filled 2017-03-11 (×2): qty 100

## 2017-03-11 NOTE — Progress Notes (Signed)
Pharmacy Antibiotic Note  Cynthia Long is a 77 y.o. female admitted on 03/03/2017 with surgical prophylaxis.  Pharmacy has been consulted for Cefazolin dosing. Plan for L1 and L2 kyphoplasty surgery Wednesday 03/12/17.   Plan: Cefazolin 2g IV x1 52min pre-operative tomorrow.   Pharmacy will sign off.   Height: 5\' 2"  (157.5 cm) Weight: 141 lb 1.5 oz (64 kg) IBW/kg (Calculated) : 50.1  Temp (24hrs), Avg:97.7 F (36.5 C), Min:97.5 F (36.4 C), Max:98 F (36.7 C)  Recent Labs  Lab 03/05/17 0520 03/07/17 0727 03/08/17 0551  03/08/17 1919 03/09/17 0420 03/10/17 0924 03/10/17 1542 03/11/17 0656  WBC 7.1 10.7* 9.0  --   --  8.4 9.4  --  7.3  CREATININE 0.90 0.94 0.77   < > 0.88 0.85 0.91 0.81 0.73  LATICACIDVEN 2.5*  --   --   --   --   --   --   --   --    < > = values in this interval not displayed.    Estimated Creatinine Clearance: 54.2 mL/min (by C-G formula based on SCr of 0.73 mg/dL).    Allergies  Allergen Reactions  . Biaxin [Clarithromycin]     Liver enzymes elevated per pt  . Sulfa Antibiotics Diarrhea    Antimicrobials this admission: Cefazolin 1/8 >>  Dose adjustments this admission:  Microbiology results: 12/31 BCx x2: negative 03/04/17 Peritoneal fluid: negative 03/04/17 UCx: multiple species, none predominant  Thank you for allowing pharmacy to be a part of this patient's care.  Sloan Leiter, PharmD, BCPS, BCCCP Clinical Pharmacist Clinical phone 03/11/2017 until 3:30PM [phone] After hours, please call #[phone] 03/11/2017 12:20 PM

## 2017-03-11 NOTE — Social Work (Addendum)
CSW f/u on SNF.  [facility] indicated that they do not have any SNF beds at this time. CSW f/u with [place]. [facility] in [place] can offer a SNF bed.  CSW will f/u with family.  12:22pm: CSW received a call from [place] in admissions at [facility] advising that they can now offers SNF bed for patient.  CSW left message for son. CSW  Will f/u.  Elissa Hefty, LCSW Clinical Social Worker [phone]

## 2017-03-11 NOTE — Progress Notes (Signed)
PROGRESS NOTE    Rolonda Pontarelli  POE:[id number] DOB: November 20, 1940 DOA: 03/03/2017 PCP: Macon Large, MD   Brief Narrative: Kimberlynn Lumbra is a 77 y.o. female with medical history significant of female,with history ofNonalcoholic steatohepatitis,diabetes mellitus,gastritis, esophageal varices. She presented with hepatic encephalopathy. Currently treating with lactulose.    Assessment & Plan:  Hepatic encephalopathy Ammonia elevated at 77 now improved. Patient alert. Continue Lactulose TID; tolerating the increased dose. Titrate for 2-3 soft bowel movements per day. -US paracentesis unremarkable No asterixis.  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 Carlean Purl as an outpatient. 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 Patient has had repeat paracenteses for ascites. -Continue lactulose, spironolactone Weight up 7 Lbs since admission.  Probably needs more lasix then standard 100:40 diuretic regimen. Pt was not on any lasix prior to admission  Resuming oral Lasix 60 mg daily. Also increasing Aldactone 100 mg daily. INR 1.3.  Hyponatremia and cirrhosis.  Corrected sodium 131 based on glucose level on 03/10/2017. Fluid restricted diet. Recheck sodium tomorrow.   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 No evidence of bleeding. Stable. Surgery once platelets to be more than 150.  Patient will get transfusion at the discretion of the surgeon.  Chronic allergic bronchitis Chronic cough which is stable.  -Continue albuterol and Pulmicort  History of diabetes mellitus Patient is diet controlled. Last A1C of 5.4. Using sliding scale insulin changing from sensitive to moderate.  Abnormal chest x-ray Possibly secondary to fluid overload. Given doxycycline in ED. Chronic cough. -follow clinically  L2 compression fracture Orthopedic consulted. Recommended intranasal calcitonin and Valium for muscle spasm as well as MRI spine. Discussion with  orthopedics, continue scheduled Robaxin. While the patient continues to complain about having back pain when asked whether the medications worked to control the pain and she mentions it does.  And the patient does not use tramadol as needed despite ordered. Plan is to perform L1 and L2 kyphoplasty tomorrow.  Thrombocytopenia is a limitation.  Surgeon will decide on when to give transfusion for platelet. Now 1.3 should not be a limitation.  DVT prophylaxis: SCDs Code Status: DNR Family Communication: None at bedside Disposition Plan: When medically stable   Consultants:   None  Procedures:   Ultrasound paracentesis  Antimicrobials:  Doxycycline (03/03/17)    Subjective: Patient keeps on complaining of having severe back pain although has not used tramadol since 03/09/2016.  Objective: Vitals:   03/10/17 1354 03/10/17 1500 03/10/17 2028 03/11/17 0514  BP: (!) 130/51  (!) 130/44 (!) 133/54  Pulse: 74  78 76  Resp: 20  18 15   Temp: (!) 97.5 F (36.4 C)  97.7 F (36.5 C) 98 F (36.7 C)  TempSrc: Oral  Oral Oral  SpO2: 100%  99% 97%  Weight:  64 kg (141 lb 1.5 oz)    Height:        Intake/Output Summary (Last 24 hours) at 03/11/2017 1631 Last data filed at 03/11/2017 1500 Gross per 24 hour  Intake 523.67 ml  Output -  Net 523.67 ml   Filed Weights   03/08/17 0740 03/09/17 1009 03/10/17 1500  Weight: 64.7 kg (142 lb 10.2 oz) 64 kg (141 lb 3.2 oz) 64 kg (141 lb 1.5 oz)    Examination:  General exam: Appears calm and comfortable.  No asterixis Respiratory system: Clear to auscultation. Diminished. Respiratory effort normal. Cardiovascular system: S1 & S2 heard, RRR. 2/6 systolic murmur. Gastrointestinal system: Abdomen is significantly distended, soft and non-tender with reducible  and non-tender umbilical hernia. Normal bowel sounds heard. Central nervous system: Alert and oriented to person and place. No focal neurological deficits. Extremities: 2+ edema. No calf  tenderness Skin: No cyanosis. No rashes Psychiatry: Judgement and insight appear stable. Mood & affect appropriate.     Data Reviewed: I have personally reviewed following labs and imaging studies  CBC: Recent Labs  Lab 03/07/17 0727 03/08/17 0551 03/09/17 0420 03/10/17 0924 03/11/17 0656  WBC 10.7* 9.0 8.4 9.4 7.3  HGB 11.1* 10.8* 10.5* 10.9* 9.9*  HCT 33.8* 32.4* 31.8* 32.6* 29.7*  MCV 92.1 91.8 91.6 91.8 91.7  PLT 109* 92* 88* 106* 92*   Basic Metabolic Panel: Recent Labs  Lab 03/07/17 0727  03/08/17 1919 03/09/17 0420 03/10/17 0924 03/10/17 1542 03/11/17 0656  NA 129*   < > 124* 128* 126* 126* 128*  K 4.6   < > 4.0 4.0 3.9 3.8 3.8  CL 102   < > 95* 101 97* 98* 99*  CO2 20*   < > 22 23 21* 20* 21*  GLUCOSE 202*   < > 271* 236* 319* 305* 185*  BUN 14   < > 13 14 16 15 14   CREATININE 0.94   < > 0.88 0.85 0.91 0.81 0.73  CALCIUM 7.9*   < > 7.5* 7.7* 7.8* 7.5* 7.9*  MG 1.7  --   --   --   --   --   --    < > = values in this interval not displayed.   GFR: Estimated Creatinine Clearance: 54.2 mL/min (by C-G formula based on SCr of 0.73 mg/dL). Liver Function Tests: Recent Labs  Lab 03/05/17 0520  AST 39  ALT 23  ALKPHOS 146*  BILITOT 5.6*  PROT 4.6*  ALBUMIN 1.7*   No results for input(s): LIPASE, AMYLASE in the last 168 hours. No results for input(s): AMMONIA in the last 168 hours. Coagulation Profile: Recent Labs  Lab 03/10/17 1815  INR 1.37   Cardiac Enzymes: No results for input(s): CKTOTAL, CKMB, CKMBINDEX, TROPONINI in the last 168 hours. BNP (last 3 results) No results for input(s): PROBNP in the last 8760 hours. HbA1C: Recent Labs    03/10/17 1542  HGBA1C 4.8   CBG: Recent Labs  Lab 03/10/17 0551 03/10/17 1700 03/10/17 2030 03/11/17 0754 03/11/17 1248  GLUCAP 239* 226* 206* 181* 183*   Lipid Profile: No results for input(s): CHOL, HDL, LDLCALC, TRIG, CHOLHDL, LDLDIRECT in the last 72 hours. Thyroid Function Tests: No results  for input(s): TSH, T4TOTAL, FREET4, T3FREE, THYROIDAB in the last 72 hours. Anemia Panel: No results for input(s): VITAMINB12, FOLATE, FERRITIN, TIBC, IRON, RETICCTPCT in the last 72 hours. Sepsis Labs: Recent Labs  Lab 03/05/17 0520  LATICACIDVEN 2.5*    Recent Results (from the past 240 hour(s))  Culture, blood (Routine x 2)     Status: None   Collection Time: 03/03/17 11:10 AM  Result Value Ref Range Status   Specimen Description BLOOD RIGHT HAND  Final   Special Requests IN PEDIATRIC BOTTLE Blood Culture adequate volume  Final   Culture NO GROWTH 5 DAYS  Final   Report Status 03/08/2017 FINAL  Final  Culture, blood (Routine x 2)     Status: None   Collection Time: 03/03/17 12:21 PM  Result Value Ref Range Status   Specimen Description BLOOD LEFT HAND  Final   Special Requests IN PEDIATRIC BOTTLE Blood Culture adequate volume  Final   Culture NO GROWTH 5 DAYS  Final  Report Status 03/08/2017 FINAL  Final  Culture, body fluid-bottle     Status: None   Collection Time: 03/04/17 10:00 AM  Result Value Ref Range Status   Specimen Description PERITONEAL  Final   Special Requests NONE  Final   Culture NO GROWTH 5 DAYS  Final   Report Status 03/09/2017 FINAL  Final  Gram stain     Status: None   Collection Time: 03/04/17 10:00 AM  Result Value Ref Range Status   Specimen Description PERITONEAL  Final   Special Requests NONE  Final   Gram Stain   Final    RARE WBC PRESENT, PREDOMINANTLY MONONUCLEAR NO ORGANISMS SEEN    Report Status 03/04/2017 FINAL  Final  Culture, Urine     Status: Abnormal   Collection Time: 03/04/17 10:30 AM  Result Value Ref Range Status   Specimen Description URINE, CLEAN CATCH  Final   Special Requests NONE  Final   Culture MULTIPLE SPECIES PRESENT, SUGGEST RECOLLECTION (A)  Final   Report Status 03/05/2017 FINAL  Final         Radiology Studies: No results found.      Scheduled Meds: . calcitonin (salmon)  1 spray Alternating Nares  Daily  . diltiazem  120 mg Oral Daily  . furosemide  60 mg Oral Daily  . Influenza vac split quadrivalent PF  0.5 mL Intramuscular Tomorrow-1000  . insulin aspart  0-15 Units Subcutaneous TID WC  . insulin aspart  0-5 Units Subcutaneous QHS  . insulin glargine  8 Units Subcutaneous Daily  . lactulose  30 g Oral TID  . methocarbamol  500 mg Oral TID  . pantoprazole  40 mg Oral BID AC  . sodium chloride flush  3 mL Intravenous Q12H  . sodium chloride  2 g Oral BID WC  . [START ON 03/12/2017] spironolactone  100 mg Oral Daily   Continuous Infusions: . sodium chloride    . sodium chloride 10 mL/hr at 03/11/17 1338  . [START ON 03/12/2017]  ceFAZolin (ANCEF) IV       LOS: 8 days     Berle Mull, MD [facility] 03/11/2017, 4:31 PM  If 7PM-7AM, please contact night-coverage [email or web address] Password [facility] 03/11/2017, 4:31 PM

## 2017-03-11 NOTE — Care Management (Signed)
Case manager received call from [facility] stating that Dr. Genene Churn (patient's primary MD), will not sign for [facility] orders at discharge. CM contacted patient's daughter [name] [phone] and asked that she reach out to MD office and explain that patient will not be followed by hospitalist at discharge. CM will continue to follow.

## 2017-03-11 NOTE — Social Work (Signed)
CSW received call back from son. CSW discussed the SNF offers-[facility] and [facility]. Son will review and get back to CSW.  CSw advised that patient may be discharging tomorrow after procedure and son indicated that he didn't think patient will be ready tomrrow. CSW validated and indicated that a SNF bed will need to be selected as the SNF's may not have availability when pt is ready for discharge.  [facility] called to f/u and CSW advised that family will let CSW know which SNF.  CSW will continue to follow up.  Elissa Hefty, LCSW Clinical Social Worker [phone]

## 2017-03-11 NOTE — Plan of Care (Signed)
Patient is up as tolerated. Uses front wheeled walker to ambulate.

## 2017-03-12 ENCOUNTER — Inpatient Hospital Stay (HOSPITAL_COMMUNITY): Payer: Medicare Other | Admitting: Certified Registered"

## 2017-03-12 ENCOUNTER — Inpatient Hospital Stay (HOSPITAL_COMMUNITY): Payer: Medicare Other

## 2017-03-12 ENCOUNTER — Encounter (HOSPITAL_COMMUNITY)
Admission: EM | Disposition: A | Discharge status: Skilled Nursing Facility | Payer: Self-pay | Source: Home / Self Care | Attending: Internal Medicine

## 2017-03-12 ENCOUNTER — Encounter (HOSPITAL_COMMUNITY): Payer: Self-pay | Admitting: Anesthesiology

## 2017-03-12 DIAGNOSIS — I10 Essential (primary) hypertension: Secondary | ICD-10-CM

## 2017-03-12 DIAGNOSIS — D696 Thrombocytopenia, unspecified: Secondary | ICD-10-CM

## 2017-03-12 DIAGNOSIS — K7460 Unspecified cirrhosis of liver: Secondary | ICD-10-CM

## 2017-03-12 DIAGNOSIS — Z9889 Other specified postprocedural states: Secondary | ICD-10-CM

## 2017-03-12 DIAGNOSIS — R188 Other ascites: Secondary | ICD-10-CM

## 2017-03-12 HISTORY — PX: KYPHOPLASTY: SHX5884

## 2017-03-12 LAB — GLUCOSE, CAPILLARY
GLUCOSE-CAPILLARY: 151 mg/dL — AB (ref 65–99)
GLUCOSE-CAPILLARY: 173 mg/dL — AB (ref 65–99)
GLUCOSE-CAPILLARY: 170 mg/dL — AB (ref 65–99)
Glucose-Capillary: 152 mg/dL — ABNORMAL HIGH (ref 65–99)

## 2017-03-12 LAB — CBC WITH DIFFERENTIAL/PLATELET
BASOS ABS: 0 10*3/uL (ref 0.0–0.1)
BASOS PCT: 1 %
Eosinophils Absolute: 0.1 10*3/uL (ref 0.0–0.7)
Eosinophils Relative: 1 %
HEMATOCRIT: 28.2 % — AB (ref 36.0–46.0)
HEMOGLOBIN: 9.6 g/dL — AB (ref 12.0–15.0)
LYMPHS PCT: 14 %
Lymphs Abs: 0.9 10*3/uL (ref 0.7–4.0)
MCH: 31.2 pg (ref 26.0–34.0)
MCHC: 34 g/dL (ref 30.0–36.0)
MCV: 91.6 fL (ref 78.0–100.0)
Monocytes Absolute: 1.2 10*3/uL — ABNORMAL HIGH (ref 0.1–1.0)
Monocytes Relative: 18 %
NEUTROS ABS: 4.5 10*3/uL (ref 1.7–7.7)
NEUTROS PCT: 66 %
Platelets: 95 10*3/uL — ABNORMAL LOW (ref 150–400)
RBC: 3.08 MIL/uL — AB (ref 3.87–5.11)
RDW: 16.6 % — ABNORMAL HIGH (ref 11.5–15.5)
WBC: 6.8 10*3/uL (ref 4.0–10.5)

## 2017-03-12 LAB — COMPREHENSIVE METABOLIC PANEL
ALK PHOS: 230 U/L — AB (ref 38–126)
ALT: 28 U/L (ref 14–54)
AST: 43 U/L — AB (ref 15–41)
Albumin: 1.5 g/dL — ABNORMAL LOW (ref 3.5–5.0)
Anion gap: 8 (ref 5–15)
BILIRUBIN TOTAL: 5.3 mg/dL — AB (ref 0.3–1.2)
BUN: 15 mg/dL (ref 6–20)
CALCIUM: 7.8 mg/dL — AB (ref 8.9–10.3)
CO2: 21 mmol/L — ABNORMAL LOW (ref 22–32)
CREATININE: 0.84 mg/dL (ref 0.44–1.00)
Chloride: 99 mmol/L — ABNORMAL LOW (ref 101–111)
GFR calc Af Amer: >60 mL/min (ref >60)
GFR calc non Af Amer: >60 mL/min (ref >60)
Glucose, Bld: 151 mg/dL — ABNORMAL HIGH (ref 65–99)
POTASSIUM: 3.6 mmol/L (ref 3.5–5.1)
Sodium: 128 mmol/L — ABNORMAL LOW (ref 135–145)
TOTAL PROTEIN: 4.4 g/dL — AB (ref 6.5–8.1)

## 2017-03-12 LAB — MAGNESIUM: Magnesium: 1.6 mg/dL — ABNORMAL LOW (ref 1.7–2.4)

## 2017-03-12 LAB — PLATELET COUNT: Platelets: 137 10*3/uL — ABNORMAL LOW (ref 150–400)

## 2017-03-12 LAB — SURGICAL PCR SCREEN
MRSA, PCR: NEGATIVE
Staphylococcus aureus: POSITIVE — AB

## 2017-03-12 SURGERY — KYPHOPLASTY
Anesthesia: General | Site: Back

## 2017-03-12 MED ORDER — CHLORHEXIDINE GLUCONATE CLOTH 2 % EX PADS
6.0000 | MEDICATED_PAD | Freq: Every day | CUTANEOUS | Status: DC
  Administered 2017-03-12 – 2017-03-13 (×2): 6 via TOPICAL

## 2017-03-12 MED ORDER — MENTHOL 3 MG MT LOZG: 1.0000 | LOZENGE | OROMUCOSAL | Status: DC | PRN

## 2017-03-12 MED ORDER — LACTATED RINGERS IV SOLN
INTRAVENOUS | Status: DC | PRN
  Administered 2017-03-12: 12:00:00 via INTRAVENOUS

## 2017-03-12 MED ORDER — OXYCODONE HCL 5 MG PO TABS
5.0000 mg | ORAL_TABLET | ORAL | Status: DC | PRN
  Administered 2017-03-12 – 2017-03-13 (×4): 5 mg via ORAL
  Filled 2017-03-12 (×5): qty 1

## 2017-03-12 MED ORDER — LACTATED RINGERS IV SOLN
INTRAVENOUS | Status: DC
  Administered 2017-03-12 (×2): via INTRAVENOUS

## 2017-03-12 MED ORDER — IOPAMIDOL (ISOVUE-300) INJECTION 61%
INTRAVENOUS | Status: DC | PRN
  Administered 2017-03-12: 50 mL

## 2017-03-12 MED ORDER — FENTANYL CITRATE (PF) 250 MCG/5ML IJ SOLN
INTRAMUSCULAR | Status: AC
  Filled 2017-03-12: qty 5

## 2017-03-12 MED ORDER — FENTANYL CITRATE (PF) 100 MCG/2ML IJ SOLN
INTRAMUSCULAR | Status: AC
  Administered 2017-03-12: 25 ug via INTRAVENOUS
  Filled 2017-03-12: qty 2

## 2017-03-12 MED ORDER — LACTATED RINGERS IV SOLN
INTRAVENOUS | Status: DC
  Administered 2017-03-12 (×2): via INTRAVENOUS

## 2017-03-12 MED ORDER — MUPIROCIN 2 % EX OINT
1.0000 "application " | TOPICAL_OINTMENT | Freq: Two times a day (BID) | CUTANEOUS | Status: DC
  Administered 2017-03-12 – 2017-03-13 (×3): 1 via NASAL
  Filled 2017-03-12 (×2): qty 22

## 2017-03-12 MED ORDER — IOPAMIDOL (ISOVUE-300) INJECTION 61%
INTRAVENOUS | Status: AC
  Filled 2017-03-12: qty 50

## 2017-03-12 MED ORDER — LIDOCAINE HCL (CARDIAC) 20 MG/ML IV SOLN
INTRAVENOUS | Status: DC | PRN
  Administered 2017-03-12: 30 mg via INTRAVENOUS

## 2017-03-12 MED ORDER — CEFAZOLIN SODIUM-DEXTROSE 2-4 GM/100ML-% IV SOLN
2.0000 g | Freq: Three times a day (TID) | INTRAVENOUS | Status: AC
  Administered 2017-03-12 – 2017-03-13 (×2): 2 g via INTRAVENOUS
  Filled 2017-03-12 (×2): qty 100

## 2017-03-12 MED ORDER — BUPIVACAINE LIPOSOME 1.3 % IJ SUSP
INTRAMUSCULAR | Status: DC | PRN
  Administered 2017-03-12: 12 mL

## 2017-03-12 MED ORDER — PROMETHAZINE HCL 25 MG/ML IJ SOLN: 6.2500 mg | INTRAMUSCULAR | Status: DC | PRN

## 2017-03-12 MED ORDER — 0.9 % SODIUM CHLORIDE (POUR BTL) OPTIME
TOPICAL | Status: DC | PRN
  Administered 2017-03-12: 1000 mL

## 2017-03-12 MED ORDER — SUCCINYLCHOLINE CHLORIDE 20 MG/ML IJ SOLN
INTRAMUSCULAR | Status: DC | PRN
  Administered 2017-03-12: 80 mg via INTRAVENOUS

## 2017-03-12 MED ORDER — SUCCINYLCHOLINE CHLORIDE 200 MG/10ML IV SOSY
PREFILLED_SYRINGE | INTRAVENOUS | Status: AC
  Filled 2017-03-12: qty 10

## 2017-03-12 MED ORDER — PROPOFOL 10 MG/ML IV BOLUS
INTRAVENOUS | Status: DC | PRN
  Administered 2017-03-12: 60 mg via INTRAVENOUS

## 2017-03-12 MED ORDER — LIDOCAINE 2% (20 MG/ML) 5 ML SYRINGE
INTRAMUSCULAR | Status: AC
  Filled 2017-03-12: qty 5

## 2017-03-12 MED ORDER — LACTATED RINGERS IV SOLN: INTRAVENOUS | Status: DC

## 2017-03-12 MED ORDER — ESMOLOL HCL 100 MG/10ML IV SOLN
INTRAVENOUS | Status: AC
  Filled 2017-03-12: qty 10

## 2017-03-12 MED ORDER — ESMOLOL HCL 100 MG/10ML IV SOLN
INTRAVENOUS | Status: DC | PRN
  Administered 2017-03-12: 30 mg via INTRAVENOUS

## 2017-03-12 MED ORDER — MEPERIDINE HCL 25 MG/ML IJ SOLN: 6.2500 mg | INTRAMUSCULAR | Status: DC | PRN

## 2017-03-12 MED ORDER — FENTANYL CITRATE (PF) 100 MCG/2ML IJ SOLN
INTRAMUSCULAR | Status: DC | PRN
  Administered 2017-03-12: 50 ug via INTRAVENOUS

## 2017-03-12 MED ORDER — PHENYLEPHRINE 40 MCG/ML (10ML) SYRINGE FOR IV PUSH (FOR BLOOD PRESSURE SUPPORT)
PREFILLED_SYRINGE | INTRAVENOUS | Status: AC
  Filled 2017-03-12: qty 10

## 2017-03-12 MED ORDER — ONDANSETRON HCL 4 MG/2ML IJ SOLN
INTRAMUSCULAR | Status: AC
  Filled 2017-03-12: qty 2

## 2017-03-12 MED ORDER — BUPIVACAINE-EPINEPHRINE 0.25% -1:200000 IJ SOLN
INTRAMUSCULAR | Status: AC
  Filled 2017-03-12: qty 1

## 2017-03-12 MED ORDER — SODIUM CHLORIDE 0.9% FLUSH: 3.0000 mL | INTRAVENOUS | Status: DC | PRN

## 2017-03-12 MED ORDER — PROPOFOL 10 MG/ML IV BOLUS
INTRAVENOUS | Status: AC
  Filled 2017-03-12: qty 20

## 2017-03-12 MED ORDER — METHOCARBAMOL 1000 MG/10ML IJ SOLN
500.0000 mg | Freq: Three times a day (TID) | INTRAVENOUS | Status: DC | PRN
  Filled 2017-03-12: qty 5

## 2017-03-12 MED ORDER — SODIUM CHLORIDE 0.9% FLUSH
3.0000 mL | Freq: Two times a day (BID) | INTRAVENOUS | Status: DC
  Administered 2017-03-12: 3 mL via INTRAVENOUS

## 2017-03-12 MED ORDER — PHENOL 1.4 % MT LIQD
1.0000 | OROMUCOSAL | Status: DC | PRN
  Administered 2017-03-12: 1 via OROMUCOSAL
  Filled 2017-03-12: qty 177

## 2017-03-12 MED ORDER — SODIUM CHLORIDE 0.9 % IV SOLN: 250.0000 mL | INTRAVENOUS | Status: DC

## 2017-03-12 MED ORDER — BUPIVACAINE-EPINEPHRINE 0.25% -1:200000 IJ SOLN
INTRAMUSCULAR | Status: DC | PRN
  Administered 2017-03-12: 12 mL

## 2017-03-12 MED ORDER — FENTANYL CITRATE (PF) 100 MCG/2ML IJ SOLN
25.0000 ug | INTRAMUSCULAR | Status: DC | PRN
  Administered 2017-03-12 (×2): 25 ug via INTRAVENOUS

## 2017-03-12 MED ORDER — BUPIVACAINE LIPOSOME 1.3 % IJ SUSP
20.0000 mL | INTRAMUSCULAR | Status: DC
  Filled 2017-03-12: qty 20

## 2017-03-12 MED ORDER — PHENYLEPHRINE HCL 10 MG/ML IJ SOLN
INTRAMUSCULAR | Status: DC | PRN
  Administered 2017-03-12: 80 ug via INTRAVENOUS

## 2017-03-12 SURGICAL SUPPLY — 51 items
BANDAGE ADH SHEER 1  50/CT (GAUZE/BANDAGES/DRESSINGS) ×3 IMPLANT
BLADE SURG 15 STRL LF DISP TIS (BLADE) ×1 IMPLANT
BLADE SURG 15 STRL SS (BLADE) ×2
BONE FILLER DEVICE STRL SZ3 (INSTRUMENTS) IMPLANT
CEMENT KYPHON CX01A KIT/MIXER (Cement) ×3 IMPLANT
COVER LIGHT HANDLE  DEROYL (MISCELLANEOUS) IMPLANT
COVER MAYO STAND STRL (DRAPES) ×3 IMPLANT
CURETTE WEDGE 8.5MM KYPHX (MISCELLANEOUS) ×3 IMPLANT
DERMABOND ADVANCED (GAUZE/BANDAGES/DRESSINGS) ×2
DERMABOND ADVANCED .7 DNX12 (GAUZE/BANDAGES/DRESSINGS) ×1 IMPLANT
DRAPE C-ARM 42X72 X-RAY (DRAPES) ×6 IMPLANT
DRAPE HALF SHEET 40X57 (DRAPES) ×6 IMPLANT
DRAPE INCISE IOBAN 66X45 STRL (DRAPES) ×3 IMPLANT
DRAPE LAPAROTOMY T 102X78X121 (DRAPES) ×3 IMPLANT
DRAPE SURG 17X23 STRL (DRAPES) ×3 IMPLANT
DRAPE U-SHAPE 47X51 STRL (DRAPES) ×3 IMPLANT
DRSG AQUACEL AG ADV 3.5X10 (GAUZE/BANDAGES/DRESSINGS) ×3 IMPLANT
DRSG OPSITE POSTOP 3X4 (GAUZE/BANDAGES/DRESSINGS) ×6 IMPLANT
DURAPREP 26ML APPLICATOR (WOUND CARE) ×3 IMPLANT
ELECT PENCIL ROCKER SW 15FT (MISCELLANEOUS) IMPLANT
ELECT REM PT RETURN 9FT ADLT (ELECTROSURGICAL)
ELECTRODE REM PT RTRN 9FT ADLT (ELECTROSURGICAL) IMPLANT
GAUZE SPONGE 4X4 16PLY XRAY LF (GAUZE/BANDAGES/DRESSINGS) ×6 IMPLANT
GLOVE BIO SURGEON STRL SZ 6.5 (GLOVE) ×2 IMPLANT
GLOVE BIO SURGEONS STRL SZ 6.5 (GLOVE) ×1
GLOVE BIOGEL PI IND STRL 6.5 (GLOVE) ×1 IMPLANT
GLOVE BIOGEL PI IND STRL 8.5 (GLOVE) ×1 IMPLANT
GLOVE BIOGEL PI INDICATOR 6.5 (GLOVE) ×2
GLOVE BIOGEL PI INDICATOR 8.5 (GLOVE) ×2
GLOVE SS BIOGEL STRL SZ 8.5 (GLOVE) ×2 IMPLANT
GLOVE SUPERSENSE BIOGEL SZ 8.5 (GLOVE) ×4
GOWN STRL REUS W/ TWL LRG LVL3 (GOWN DISPOSABLE) ×2 IMPLANT
GOWN STRL REUS W/TWL 2XL LVL3 (GOWN DISPOSABLE) ×3 IMPLANT
GOWN STRL REUS W/TWL LRG LVL3 (GOWN DISPOSABLE) ×4
KIT BASIN OR (CUSTOM PROCEDURE TRAY) ×3 IMPLANT
KIT ROOM TURNOVER OR (KITS) ×3 IMPLANT
NEEDLE HYPO 25X1 1.5 SAFETY (NEEDLE) ×3 IMPLANT
NEEDLE SPNL 18GX3.5 QUINCKE PK (NEEDLE) ×6 IMPLANT
NS IRRIG 1000ML POUR BTL (IV SOLUTION) ×3 IMPLANT
PACK SURGICAL SETUP 50X90 (CUSTOM PROCEDURE TRAY) ×3 IMPLANT
PACK UNIVERSAL I (CUSTOM PROCEDURE TRAY) ×3 IMPLANT
PAD ARMBOARD 7.5X6 YLW CONV (MISCELLANEOUS) ×12 IMPLANT
SURGIFLO W/THROMBIN 8M KIT (HEMOSTASIS) IMPLANT
SUT BONE WAX W31G (SUTURE) ×3 IMPLANT
SUT MNCRL AB 3-0 PS2 18 (SUTURE) ×3 IMPLANT
SYR CONTROL 10ML LL (SYRINGE) ×3 IMPLANT
TOWEL OR 17X24 6PK STRL BLUE (TOWEL DISPOSABLE) ×3 IMPLANT
TOWEL OR 17X26 10 PK STRL BLUE (TOWEL DISPOSABLE) ×3 IMPLANT
TRAY KYPHOPAK 15/3 ONESTEP 1ST (MISCELLANEOUS) ×3 IMPLANT
TRAY KYPHOPAK 20/3 ONESTEP 1ST (MISCELLANEOUS) ×3 IMPLANT
WATER STERILE IRR 1000ML POUR (IV SOLUTION) ×3 IMPLANT

## 2017-03-12 NOTE — Progress Notes (Signed)
Physical Therapy Cancellation Note   03/12/17 1109  PT Visit Information  Last PT Received On 03/12/17  Reason Eval/Treat Not Completed Patient at procedure or test/unavailable. Pt off unit for kyphoplasty. PT will check on pt later as time allows.   Earney Navy, PTA Pager: [phone]

## 2017-03-12 NOTE — Social Work (Signed)
CSW received call from son yesterday confirming that they have accepted the SNF bed offer from [facility] and [facility] in [place].  CSW spoke with Myriam Jacobson in admissions at [facility] today to advise of the family's acceptance of bed offer.    CSW will f/u with SNF on when patient will be medically ready to transition to SNF.  CSW will continue to follow.  Elissa Hefty, LCSW Clinical Social Worker [phone]

## 2017-03-12 NOTE — Transfer of Care (Signed)
Immediate Anesthesia Transfer of Care Note  Patient: Cynthia Long  Procedure(s) Performed: KYPHOPLASTY Lumbar one and Lumbar two  (N/A Back)  Patient Location: PACU  Anesthesia Type:General  Level of Consciousness: awake  Airway & Oxygen Therapy: Patient Spontanous Breathing and Patient connected to face mask oxygen  Post-op Assessment: Report given to RN and Post -op Vital signs reviewed and stable  Post vital signs: Reviewed and stable  Last Vitals:  Vitals:   03/12/17 1130 03/12/17 1342  BP: (!) 114/37 (!) 89/41  Pulse: 71 (!) 58  Resp: 14 19  Temp: 36.4 C (!) 36.3 C  SpO2: 96% 95%    Last Pain:  Vitals:   03/12/17 1342  TempSrc:   PainSc: Asleep      Patients Stated Pain Goal: 2 (18/86/77 3736)  Complications: No apparent anesthesia complications

## 2017-03-12 NOTE — Brief Op Note (Signed)
03/12/2017  1:21 PM  PATIENT:  Cynthia Long  77 y.o. female  PRE-OPERATIVE DIAGNOSIS:  Compression Fracture  POST-OPERATIVE DIAGNOSIS:  Compression Fracture  PROCEDURE:  Procedure(s): KYPHOPLASTY Lumbar one and Lumbar two  (N/A)  SURGEON:  Surgeon(s) and Role:    Melina Schools, MD - Primary  PHYSICIAN ASSISTANT:   ASSISTANTS: carmen mayo   ANESTHESIA:   general  EBL:  minimal   BLOOD ADMINISTERED:N/A  DRAINS: none   LOCAL MEDICATIONS USED:  MARCAINE    and OTHER exparal  SPECIMEN:  No Specimen  DISPOSITION OF SPECIMEN:  N/A  COUNTS:  YES  TOURNIQUET:  * No tourniquets in log *  DICTATION: .Dragon Dictation  PLAN OF CARE: Admit to inpatient   PATIENT DISPOSITION:  PACU - hemodynamically stable.

## 2017-03-12 NOTE — Op Note (Signed)
Operative report.  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  Operative procedure L1 and L2 kyphoplasty  First Assistant [place], [place]  Indications: This is a pleasant 77 year old woman is had severe debilitating back pain for almost 1 week now.  She was admitted to the hospital for complications of her liver disease and then noted to have significant increase in back pain which is prohibited her from walking.  Attempts at pain management had failed and so we elected to proceed with a kyphoplasty.  All appropriate risks benefits and alternatives were discussed with the patient and consent was obtained.  Operative note patient was brought the operating room placed upon the operating table.  After successful induction of general anesthesia and endotracheally patient teds SCDs were applied and she was turned prone onto the operative table.  Gel rolls were placed underneath the chest and pelvis and the arms were tucked at the side.  The back was prepped and draped in a standard fashion.  Timeout was taken confirming patient procedure and all other important data.  Two C arms worse draped and then brought into the field (1 in the AP, the other in the lateral plane).  I then identified the L1 and L2 compression fractures.  I then marked out the lateral borders of the pedicles and infiltrated the area with quarter percent Marcaine mixed with Exparel.  Small incisions were made and the Jamshidi needles were advanced percutaneously down to the lateral aspect of the facet complex.  Biplane fluoroscopy I advanced the Jamshidi needle down through the pedicle to the posterior wall of the vertebral body.  Once I was at the posterior wall I confirmed that I was nearing the medial wall pedicle on the AP view.  Once this was confirmed I advanced myself into the vertebral body.  Stated on the contralateral side and at the L2 level.  Once all 14 cannulas were in  place I then drilled used my curette and then placed the inflatable bone tamp into the L1 vertebral body.  I inflated a total of 3 cc on either side.  I had excellent fill.  I deflated the balloons and then placed a total of 6 cc of cement 3 per side at the L1 level.  Cement was contained within the vertebral body.  There was no leak posteriorly anteriorly.   I had an excellent flow & fill.  I then repeated this exact same procedure at the L2 level.  The cement was allowed to harden at each level and then I removed the trochars.  There was no significant active bleeding at this point.  The skin was cleaned and the 4 wounds were closed with interrupted 3-0 Monocryl sutures.  Dry dressing and skin glue were applied.  The patient was extubated transferred the PACU without incident.  The end of the case all needle sponge counts were correct.  There were no adverse intraoperative events during surgery.

## 2017-03-12 NOTE — Progress Notes (Signed)
PT Cancellation Note  Patient Details Name: Cynthia Long MRN: [id number] DOB: [birth year]   Cancelled Treatment:    Reason Eval/Treat Not Completed: Pain limiting ability to participate Pt reporting increased pain following procedure and requesting to hold PT. Will follow up as schedule allows.   Leighton Ruff, PT, DPT  Acute Rehabilitation Services  Pager: [phone]  Rudean Hitt 03/12/2017, 5:10 PM

## 2017-03-12 NOTE — Progress Notes (Signed)
    Subjective: Procedure(s) (LRB): KYPHOPLASTY L1 and L2 (N/A) Day of Surgery  Patient reports pain as 5 on 0-10 scale.  Reports N/A leg pain reports back pain   Positive void Positive bowel movement Positive flatus Negative chest pain or shortness of breath  Objective: Vital signs in last 24 hours: Temp:  [97.5 F (36.4 C)-98.1 F (36.7 C)] 97.6 F (36.4 C) (01/09 1751) Pulse Rate:  [63-68] 66 (01/09 0611) Resp:  [12-16] 12 (01/09 0611) BP: (119-121)/(43-49) 121/49 (01/09 0611) SpO2:  [97 %-98 %] 97 % (01/09 0611) Weight:  [65.5 kg (144 lb 6.4 oz)] 65.5 kg (144 lb 6.4 oz) (01/09 0611)  Intake/Output from previous day: 01/08 0701 - 01/09 0700 In: 493.7 [P.O.:480; I.V.:13.7] Out: -   Labs: Recent Labs    03/11/17 0656 03/12/17 0715  WBC 7.3 6.8  RBC 3.24* 3.08*  HCT 29.7* 28.2*  PLT 92* 95*   Recent Labs    03/11/17 0656 03/12/17 0715  NA 128* 128*  K 3.8 3.6  CL 99* 99*  CO2 21* 21*  BUN 14 15  CREATININE 0.73 0.84  GLUCOSE 185* 151*  CALCIUM 7.9* 7.8*   Recent Labs    03/10/17 1815  INR 1.37    Physical Exam: Neurologically intact ABD soft Intact pulses distally Compartment soft significant LBP.  Difficulty ambulating and transitioning from supine to seating/standing due to pain Body mass index is 26.41 kg/m.   Assessment/Plan: Patient stable  Again discussed the procedure with the patient and family members.  Reviewed the risks and benefits and alternatives to surgery.  Because of the ongoing pain and loss in quality of life we have elected to proceed with a 2 level kyphoplasty.  Platelet count this morning is 95.  Awaiting transfusion of 1 pack of platelets prior to procedure.  Melina Schools, MD [place] [phone]

## 2017-03-12 NOTE — Progress Notes (Signed)
Patient transported to pre-op area; report called to [place], Therapist, sports.  AM meds held prior to procedure - OR RN aware.  Platelet count 95 this AM; blood bank aware of need for platelets as confirmed via phone conversation approx 0930.  Dr. Quincy Simmonds at bedside for eval just prior to transport and discusses goals of care w/patient and brother-in-law.  Plan for L1-2 Kyphoplasty per Dr. Rolena Infante this AM; potential for discharge tomorrow to [facility] in [place].

## 2017-03-12 NOTE — Anesthesia Postprocedure Evaluation (Signed)
Anesthesia Post Note  Patient: Cynthia Long  Procedure(s) Performed: KYPHOPLASTY Lumbar one and Lumbar two  (N/A Back)     Patient location during evaluation: PACU Anesthesia Type: General Level of consciousness: awake and alert Pain management: pain level controlled Vital Signs Assessment: post-procedure vital signs reviewed and stable Respiratory status: spontaneous breathing, nonlabored ventilation, respiratory function stable and patient connected to nasal cannula oxygen Cardiovascular status: blood pressure returned to baseline and stable Postop Assessment: no apparent nausea or vomiting Anesthetic complications: no    Last Vitals:  Vitals:   03/12/17 1435 03/12/17 1457  BP: (!) 105/47 (!) 88/27  Pulse: 89 90  Resp: 17 18  Temp: 36.5 C 36.7 C  SpO2: 92% 91%    Last Pain:  Vitals:   03/12/17 1457  TempSrc: Oral  PainSc:                  Effie Berkshire

## 2017-03-12 NOTE — Anesthesia Preprocedure Evaluation (Addendum)
Anesthesia Evaluation  Patient identified by MRN, date of birth, ID band Patient awake    Reviewed: Allergy & Precautions, NPO status , Patient's Chart, lab work & pertinent test results  Airway Mallampati: I  TM Distance: >3 FB Neck ROM: Full    Dental  (+) Teeth Intact, Dental Advisory Given   Pulmonary former smoker,    breath sounds clear to auscultation       Cardiovascular hypertension, Pt. on medications + dysrhythmias + Valvular Problems/Murmurs  Rhythm:Regular Rate:Normal + Systolic murmurs    Neuro/Psych  Headaches, negative psych ROS   GI/Hepatic negative GI ROS, (+) Cirrhosis   Esophageal Varices and ascites    ,   Endo/Other  diabetes  Renal/GU ARFRenal disease     Musculoskeletal negative musculoskeletal ROS (+)   Abdominal (+) + obese,   Peds  Hematology negative hematology ROS (+)   Anesthesia Other Findings   Reproductive/Obstetrics                           Lab Results  Component Value Date   WBC 6.8 03/12/2017   HGB 9.6 (L) 03/12/2017   HCT 28.2 (L) 03/12/2017   MCV 91.6 03/12/2017   PLT 95 (L) 03/12/2017   Lab Results  Component Value Date   CREATININE 0.84 03/12/2017   BUN 15 03/12/2017   NA 128 (L) 03/12/2017   K 3.6 03/12/2017   CL 99 (L) 03/12/2017   CO2 21 (L) 03/12/2017   Lab Results  Component Value Date   INR 1.37 03/10/2017   INR 1.32 03/04/2017   INR 1.18 03/03/2017   Echo: - Left ventricle: The cavity size was normal. Wall thickness was   increased in a pattern of mild LVH. Systolic function was   vigorous. The estimated ejection fraction was in the range of 65% to 70%. Wall motion was normal; there were no regional wall motion abnormalities. Doppler parameters are consistent with restrictive physiology, indicative of decreased left ventricular diastolic compliance and/or increased left atrial pressure. Doppler parameters are consistent with  high ventricular filling pressure. - Aortic valve: Severely calcified annulus. Trileaflet; mildly   thickened, mildly calcified leaflets. There was no stenosis. - Mitral valve: Mildly calcified annulus. Moderately calcified   leaflets . The findings are consistent with mild to moderate   stenosis. There was mild regurgitation. Valve area by pressure   half-time: 1.25 cm^2. - Tricuspid valve: There was moderate regurgitation.  Anesthesia Physical Anesthesia Plan  ASA: III  Anesthesia Plan: General   Post-op Pain Management:    Induction: Intravenous, Rapid sequence and Cricoid pressure planned  PONV Risk Score and Plan: 3 and Ondansetron, Dexamethasone and Midazolam  Airway Management Planned: Oral ETT  Additional Equipment: None  Intra-op Plan:   Post-operative Plan: Extubation in OR  Informed Consent: I have reviewed the patients History and Physical, chart, labs and discussed the procedure including the risks, benefits and alternatives for the proposed anesthesia with the patient or authorized representative who has indicated his/her understanding and acceptance.   Dental advisory given  Plan Discussed with: CRNA  Anesthesia Plan Comments:       Anesthesia Quick Evaluation

## 2017-03-12 NOTE — Progress Notes (Signed)
PROGRESS NOTE [facility]   Cynthia Long   YJE:[id number] DOB: Feb 03, 1941  DOA: 03/03/2017 PCP: Macon Large, MD   Brief Narrative:  Cynthia Long is a 77 y.o. femalewith medical history significant offemale,with history ofNonalcoholic steatohepatitis,diabetes mellitus,gastritis, esophageal varices. She presented with hepatic encephalopathy. Currently treating with lactulose.   Subjective: Patient seen and examined, reported severe back pain this morning. She is now status post kyphoplasty.  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 Carlean Purl as an outpatient. 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 Patient has had repeat paracenteses for ascites. Continue lactulose and Aldactone Abdomen slightly distended this a.m. may need therapeutic paracentesis prior to discharge  Hyponatremia due to cirrhosis  Continue fluid restriction Sodium remain stable around 128 Albumin low Check hepatic function and INR in a.m.  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 No evidence of bleeding. Stable. Transfused 1 unit of platelets prior to surgery on 1/9/2019t  History of diabetes mellitus Patient is diet controlled. Last A1C of 5.4. Using sliding scale insulin changing from sensitive to moderate.  Abnormal chest x-ray Possibly secondary to fluid overload. Given doxycycline in ED. Chronic cough. -follow clinically  L2 compression fracture Status post L1 and L2 kyphoplasty Continue pain management per orthopedic surgery Robaxin for muscle spasm  DVT prophylaxis: SCDs Code Status: DNR Family Communication: Family at bedside Disposition Plan: Home in the next 36-48 hours  Consultants:   Orthopedic  surgery  Procedures:   Kyphoplasty 03/12/2017  Antimicrobials: Anti-infectives (From admission, onward)   Start     Dose/Rate Route Frequency Ordered Stop   03/12/17 2000  ceFAZolin (ANCEF) IVPB 2g/100 mL premix     2 g 200 mL/hr over 30 Minutes Intravenous Every 8 hours 03/12/17 1530 03/13/17 1159   03/12/17 1100  ceFAZolin (ANCEF) IVPB 2g/100 mL premix     2 g 200 mL/hr over 30 Minutes Intravenous 60 min pre-op 03/11/17 1228 03/12/17 1220   03/03/17 1445  doxycycline (VIBRAMYCIN) 100 mg in dextrose 5 % 250 mL IVPB     100 mg 125 mL/hr over 120 Minutes Intravenous  Once 03/03/17 1431 03/03/17 1725           Objective: Vitals:   03/12/17 1412 03/12/17 1427 03/12/17 1435 03/12/17 1457  BP: (!) 104/43 (!) 106/44 (!) 105/47 (!) 88/27  Pulse: 78 88 89 90  Resp: 20 17 17 18   Temp:   97.7 F (36.5 C) 98 F (36.7 C)  TempSrc:    Oral  SpO2: 95% 95% 92% 91%  Weight:      Height:        Intake/Output Summary (Last 24 hours) at 03/12/2017 1813 Last data filed at 03/12/2017 1437 Gross per 24 hour  Intake 1224 ml  Output 30 ml  Net 1194 ml   Filed Weights   03/09/17 1009 03/10/17 1500 03/12/17 0611  Weight: 64 kg (141 lb 3.2 oz) 64 kg (141 lb 1.5 oz) 65.5 kg (144 lb 6.4 oz)    Examination:  General exam: Anxious Respiratory system: Clear to auscultation. No wheezes,crackle or rhonchi Cardiovascular system: S1 & S2 heard, RRR. No JVD, murmurs, rubs or gallops Gastrointestinal system: Abdomen soft, distended, nontender, non-shift wave appreciated Central nervous system: Alert and oriented. No focal neurological deficits. Extremities: Trace lower extremity  edema bilaterally Skin: No rashes Psychiatry:  Mood & affect appropriate.    Data Reviewed: I have personally reviewed following labs and imaging studies  CBC: Recent Labs  Lab 03/08/17 0551 03/09/17 0420 03/10/17 0924 03/11/17 0656 03/12/17 0715 03/12/17 1556  WBC 9.0 8.4 9.4 7.3 6.8  --   NEUTROABS  --   --    --   --  4.5  --   HGB 10.8* 10.5* 10.9* 9.9* 9.6*  --   HCT 32.4* 31.8* 32.6* 29.7* 28.2*  --   MCV 91.8 91.6 91.8 91.7 91.6  --   PLT 92* 88* 106* 92* 95* 716*   Basic Metabolic Panel: Recent Labs  Lab 03/07/17 0727  03/09/17 0420 03/10/17 0924 03/10/17 1542 03/11/17 0656 03/12/17 0715  NA 129*   < > 128* 126* 126* 128* 128*  K 4.6   < > 4.0 3.9 3.8 3.8 3.6  CL 102   < > 101 97* 98* 99* 99*  CO2 20*   < > 23 21* 20* 21* 21*  GLUCOSE 202*   < > 236* 319* 305* 185* 151*  BUN 14   < > 14 16 15 14 15   CREATININE 0.94   < > 0.85 0.91 0.81 0.73 0.84  CALCIUM 7.9*   < > 7.7* 7.8* 7.5* 7.9* 7.8*  MG 1.7  --   --   --   --   --  1.6*   < > = values in this interval not displayed.   GFR: Estimated Creatinine Clearance: 52.2 mL/min (by C-G formula based on SCr of 0.84 mg/dL). Liver Function Tests: Recent Labs  Lab 03/12/17 0715  AST 43*  ALT 28  ALKPHOS 230*  BILITOT 5.3*  PROT 4.4*  ALBUMIN 1.5*   No results for input(s): LIPASE, AMYLASE in the last 168 hours. No results for input(s): AMMONIA in the last 168 hours. Coagulation Profile: Recent Labs  Lab 03/10/17 1815  INR 1.37   Cardiac Enzymes: No results for input(s): CKTOTAL, CKMB, CKMBINDEX, TROPONINI in the last 168 hours. BNP (last 3 results) No results for input(s): PROBNP in the last 8760 hours. HbA1C: Recent Labs    03/10/17 1542  HGBA1C 4.8   CBG: Recent Labs  Lab 03/11/17 1713 03/11/17 2122 03/12/17 0608 03/12/17 1055 03/12/17 1342  GLUCAP 234* 184* 151* 152* 173*   Lipid Profile: No results for input(s): CHOL, HDL, LDLCALC, TRIG, CHOLHDL, LDLDIRECT in the last 72 hours. Thyroid Function Tests: No results for input(s): TSH, T4TOTAL, FREET4, T3FREE, THYROIDAB in the last 72 hours. Anemia Panel: No results for input(s): VITAMINB12, FOLATE, FERRITIN, TIBC, IRON, RETICCTPCT in the last 72 hours. Sepsis Labs: No results for input(s): PROCALCITON, LATICACIDVEN in the last 168 hours.  Recent  Results (from the past 240 hour(s))  Culture, blood (Routine x 2)     Status: None   Collection Time: 03/03/17 11:10 AM  Result Value Ref Range Status   Specimen Description BLOOD RIGHT HAND  Final   Special Requests IN PEDIATRIC BOTTLE Blood Culture adequate volume  Final   Culture NO GROWTH 5 DAYS  Final   Report Status 03/08/2017 FINAL  Final  Culture, blood (Routine x 2)     Status: None   Collection Time: 03/03/17 12:21 PM  Result Value Ref Range Status   Specimen Description BLOOD LEFT HAND  Final   Special Requests IN PEDIATRIC BOTTLE Blood Culture adequate volume  Final   Culture NO GROWTH 5 DAYS  Final   Report  Status 03/08/2017 FINAL  Final  Culture, body fluid-bottle     Status: None   Collection Time: 03/04/17 10:00 AM  Result Value Ref Range Status   Specimen Description PERITONEAL  Final   Special Requests NONE  Final   Culture NO GROWTH 5 DAYS  Final   Report Status 03/09/2017 FINAL  Final  Gram stain     Status: None   Collection Time: 03/04/17 10:00 AM  Result Value Ref Range Status   Specimen Description PERITONEAL  Final   Special Requests NONE  Final   Gram Stain   Final    RARE WBC PRESENT, PREDOMINANTLY MONONUCLEAR NO ORGANISMS SEEN    Report Status 03/04/2017 FINAL  Final  Culture, Urine     Status: Abnormal   Collection Time: 03/04/17 10:30 AM  Result Value Ref Range Status   Specimen Description URINE, CLEAN CATCH  Final   Special Requests NONE  Final   Culture MULTIPLE SPECIES PRESENT, SUGGEST RECOLLECTION (A)  Final   Report Status 03/05/2017 FINAL  Final  Surgical pcr screen     Status: Abnormal   Collection Time: 03/11/17 11:41 PM  Result Value Ref Range Status   MRSA, PCR NEGATIVE NEGATIVE Final   Staphylococcus aureus POSITIVE (A) NEGATIVE Final    Comment: (NOTE) The Xpert SA Assay (FDA approved for NASAL specimens in patients 49 years of age and older), is one component of a comprehensive surveillance program. It is not intended to  diagnose infection nor to guide or monitor treatment.       Radiology Studies: Dg Lumbar Spine 2-3 Views  Result Date: 03/12/2017 CLINICAL DATA:  L1 and L2 kyphoplasty EXAM: LUMBAR SPINE - 2-3 VIEW; DG C-ARM 61-120 MIN COMPARISON:  MRI lumbar spine 01/03/201 FINDINGS: Vertebral augmentation changes are noted at L1 and L2. No complicating features. IMPRESSION: Vertebral augmentation changes at L1 and L2. Electronically Signed   By: Marijo Sanes M.D.   On: 03/12/2017 13:54   Dg C-arm 1-60 Min  Result Date: 03/12/2017 CLINICAL DATA:  L1 and L2 kyphoplasty EXAM: LUMBAR SPINE - 2-3 VIEW; DG C-ARM 61-120 MIN COMPARISON:  MRI lumbar spine 01/03/201 FINDINGS: Vertebral augmentation changes are noted at L1 and L2. No complicating features. IMPRESSION: Vertebral augmentation changes at L1 and L2. Electronically Signed   By: Marijo Sanes M.D.   On: 03/12/2017 13:54   Dg C-arm 1-60 Min  Result Date: 03/12/2017 CLINICAL DATA:  L1 and L2 kyphoplasty EXAM: LUMBAR SPINE - 2-3 VIEW; DG C-ARM 61-120 MIN COMPARISON:  MRI lumbar spine 01/03/201 FINDINGS: Vertebral augmentation changes are noted at L1 and L2. No complicating features. IMPRESSION: Vertebral augmentation changes at L1 and L2. Electronically Signed   By: Marijo Sanes M.D.   On: 03/12/2017 13:54      Scheduled Meds: . calcitonin (salmon)  1 spray Alternating Nares Daily  . Chlorhexidine Gluconate Cloth  6 each Topical Daily  . diltiazem  120 mg Oral Daily  . furosemide  60 mg Oral Daily  . Influenza vac split quadrivalent PF  0.5 mL Intramuscular Tomorrow-1000  . insulin aspart  0-15 Units Subcutaneous TID WC  . insulin aspart  0-5 Units Subcutaneous QHS  . insulin glargine  8 Units Subcutaneous Daily  . lactulose  30 g Oral TID  . methocarbamol  500 mg Oral TID  . mupirocin ointment  1 application Nasal BID  . pantoprazole  40 mg Oral BID AC  . sodium chloride flush  3 mL Intravenous Q12H  .  sodium chloride flush  3 mL Intravenous Q12H    . spironolactone  100 mg Oral Daily   Continuous Infusions: . sodium chloride    . sodium chloride 10 mL/hr at 03/11/17 1338  . sodium chloride    .  ceFAZolin (ANCEF) IV    . lactated ringers 10 mL/hr at 03/12/17 1051  . lactated ringers 85 mL/hr at 03/12/17 1807  . methocarbamol (ROBAXIN)  IV       LOS: 9 days    Time spent: Total of 25 minutes spent with pt, greater than 50% of which was spent in discussion of  treatment, counseling and coordination of care   Chipper Oman, MD Pager: Text Page via [email or web address]   If 7PM-7AM, please contact night-coverage [email or web address] 03/12/2017, 6:13 PM

## 2017-03-13 ENCOUNTER — Encounter (HOSPITAL_COMMUNITY): Payer: Self-pay | Admitting: Orthopedic Surgery

## 2017-03-13 DIAGNOSIS — K7581 Nonalcoholic steatohepatitis (NASH): Secondary | ICD-10-CM

## 2017-03-13 LAB — GLUCOSE, CAPILLARY
GLUCOSE-CAPILLARY: 200 mg/dL — AB (ref 65–99)
GLUCOSE-CAPILLARY: 214 mg/dL — AB (ref 65–99)
GLUCOSE-CAPILLARY: 190 mg/dL — AB (ref 65–99)

## 2017-03-13 LAB — PREPARE PLATELET PHERESIS: Unit division: 0

## 2017-03-13 LAB — HEPATIC FUNCTION PANEL
ALBUMIN: 1.8 g/dL — AB (ref 3.5–5.0)
ALT: 28 U/L (ref 14–54)
AST: 46 U/L — ABNORMAL HIGH (ref 15–41)
Alkaline Phosphatase: 253 U/L — ABNORMAL HIGH (ref 38–126)
BILIRUBIN INDIRECT: 2.8 mg/dL — AB (ref 0.3–0.9)
BILIRUBIN TOTAL: 4.9 mg/dL — AB (ref 0.3–1.2)
Bilirubin, Direct: 2.1 mg/dL — ABNORMAL HIGH (ref 0.1–0.5)
Total Protein: 4.8 g/dL — ABNORMAL LOW (ref 6.5–8.1)

## 2017-03-13 LAB — BPAM PLATELET PHERESIS
BLOOD PRODUCT EXPIRATION DATE: 201901112359
ISSUE DATE / TIME: 201901091114
UNIT TYPE AND RH: 6200

## 2017-03-13 LAB — CBC
HCT: 31.6 % — ABNORMAL LOW (ref 36.0–46.0)
Hemoglobin: 10.4 g/dL — ABNORMAL LOW (ref 12.0–15.0)
MCH: 31 pg (ref 26.0–34.0)
MCHC: 32.9 g/dL (ref 30.0–36.0)
MCV: 94 fL (ref 78.0–100.0)
PLATELETS: 152 10*3/uL (ref 150–400)
RBC: 3.36 MIL/uL — AB (ref 3.87–5.11)
RDW: 17.2 % — ABNORMAL HIGH (ref 11.5–15.5)
WBC: 10.2 10*3/uL (ref 4.0–10.5)

## 2017-03-13 MED ORDER — FUROSEMIDE 40 MG PO TABS: 60.0000 mg | ORAL_TABLET | Freq: Every day | ORAL | Status: AC

## 2017-03-13 MED ORDER — SPIRONOLACTONE 50 MG PO TABS: 50.0000 mg | ORAL_TABLET | Freq: Two times a day (BID) | ORAL | Status: AC

## 2017-03-13 MED ORDER — LACTULOSE 10 GM/15ML PO SOLN: 30.0000 g | Freq: Three times a day (TID) | ORAL | 0 refills | Status: AC

## 2017-03-13 MED ORDER — OXYCODONE HCL 5 MG PO TABS
5.0000 mg | ORAL_TABLET | ORAL | 0 refills | Status: AC | PRN
Start: 2017-03-13 — End: ?

## 2017-03-13 NOTE — Plan of Care (Signed)
  Progressing Pain Managment: General experience of comfort will improve 03/13/2017 0415 - Progressing by West Pugh, RN Safety: Ability to remain free from injury will improve 03/13/2017 0415 - Progressing by West Pugh, RN Skin Integrity: Risk for impaired skin integrity will decrease 03/13/2017 0415 - Progressing by West Pugh, RN

## 2017-03-13 NOTE — Social Work (Addendum)
Clinical Social Worker facilitated patient discharge including contacting patient family and facility to confirm patient discharge plans.  Clinical information faxed to facility and family agreeable with plan.    CSW arranged ambulance transport via [facility] to [place] in [place], [place].    RN to call [phone] to give report prior to discharge.  Clinical Social Worker will sign off for now as social work intervention is no longer needed. Please consult [place] again if new need arises.  Elissa Hefty, LCSW Clinical Social Worker [phone]

## 2017-03-13 NOTE — Discharge Summary (Signed)
Physician Discharge Summary  Cynthia Long  WCB:[id number]  DOB: 12/23/1940  DOA: 03/03/2017 PCP: Macon Large, MD  Admit date: 03/03/2017 Discharge date: 03/13/2017  Admitted From: Home  Disposition: SNF   Recommendations for Outpatient Follow-up:  1. Follow up with PCP in 1-2 weeks 2. Please obtain BMP/CBC in one week 3. Please follow up on the following pending results:  Discharge Condition: Stable  CODE STATUS: DNR  Diet recommendation: Heart Healthy   Brief/Interim Summary: For full details see H&P/progress note but in brief, Cynthia Long is a 77 y.o.femalewith medical history significant offemale,with history ofNonalcoholic steatohepatitis,diabetes mellitus,gastritis, esophageal varices. She presented with hepatic encephalopathy she was started on lactulose, mental status improved.  She underwent ultrasound thoracentesis which was unremarkable.  During hospital stay patient complained of severe back pain she was known to have lumbar compression fracture, orthopedic surgery was consulted and patient underwent L1 and L2 kyphoplasty.  PT recommended short-term rehab.  Patient mentally back to baseline and she will be discharged to SNF.   Subjective: Patient seen and examined, she reported doing as well.  Pain much better after surgery.  She worked with PT with no issues.  She remains alert and oriented x3.  Denies abdominal pain, increasing abdominal girth and shortness of breath.  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 Carlean Purl as an outpatient. 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 Patient has had repeat paracenteses for ascites. Continue lactulose and Aldactone Abdomen currently at baseline, patient does not feel abdominal discomfort, on exam abdomen is soft, no fluid wave noted and no tenderness. 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 No evidence of bleeding. Stable. Transfuse 1 unit of platelets, platelets stable now.  History of diabetes mellitus Patient is diet controlled. Last A1C of 5.4. Monitor random CBG's    L2 compression fracture Status post L1 and L2 kyphoplasty Continue pain management per orthopedic surgery Robaxin for muscle spasm  All other chronic medical condition were stable during the hospitalization.  Patient was seen by physical therapy, recommending SNF  On the day of the discharge the patient's vitals were stable, and no other acute medical condition were reported by patient. the patient was felt safe to be discharge to SNF   Discharge Instructions  You were cared for by a hospitalist during your hospital stay. If you have any questions about your discharge medications or the care you received while you were in the hospital after you are discharged, you can call the unit and asked to speak with the hospitalist on call if the hospitalist that took care of you is not available. Once you are discharged, your primary care physician will handle any further medical issues. Please note that NO REFILLS for any discharge medications will be authorized once you are discharged, as it is imperative that you return to your primary care physician (or establish  a relationship with a primary care physician if you do not have one) for your  aftercare needs so that they can reassess your need for medications and monitor your lab values.  Discharge Instructions    Call MD for:  difficulty breathing, headache or visual disturbances   Complete by:  As directed    Call MD for:  extreme fatigue   Complete by:  As directed    Call MD for:  hives   Complete by:  As directed    Call MD for:  persistant dizziness or light-headedness   Complete by:  As directed    Call MD for:  persistant nausea and vomiting   Complete by:  As directed    Call MD for:  redness, tenderness, or signs of infection (pain, swelling, redness, odor or green/yellow discharge around incision site)   Complete by:  As directed    Call MD for:  severe uncontrolled pain   Complete by:  As directed    Call MD for:  temperature >100.4   Complete by:  As directed    Diet - low sodium heart healthy   Complete by:  As directed    Diet - low sodium heart healthy   Complete by:  As directed    Incentive spirometry RT   Complete by:  As directed    Increase activity slowly   Complete by:  As directed    Increase activity slowly   Complete by:  As directed      Allergies as of 03/13/2017      Reactions   Biaxin [clarithromycin]    Liver enzymes elevated per pt   Sulfa Antibiotics Diarrhea      Medication List    STOP taking these medications   diltiazem 120 MG 24 hr capsule Commonly known as:  DILACOR XR     TAKE these medications   acetaminophen 325 MG tablet Commonly known as:  TYLENOL Take 325 mg by mouth every 6 (six) hours as needed for mild pain or headache.   budesonide 0.5 MG/2ML nebulizer solution Commonly known as:  PULMICORT Take 0.5 mg by nebulization daily as needed (for shortness of breath).   diltiazem 120 MG 24 hr capsule Commonly known as:  CARDIZEM CD Take 1 capsule daily by mouth.   furosemide 40 MG tablet Commonly known as:  LASIX Take 1.5 tablets (60 mg total) by mouth daily. What changed:  how much to take   lactulose  10 GM/15ML solution Commonly known as:  CHRONULAC Take 45 mLs (30 g total) by mouth 3 (three) times daily. What changed:  how much to take   ondansetron 4 MG disintegrating tablet Commonly known as:  ZOFRAN ODT Take 1 tablet (4 mg total) every 6 (six) hours as needed by mouth for nausea or vomiting.   onetouch ultrasoft lancets 1 each by Other route 2 (two) times daily.   spironolactone 50 MG tablet Commonly known as:  ALDACTONE Take 1 tablet (50 mg total) by mouth 2 (two) times daily. What changed:  when to take this   VENTOLIN HFA 108 (90 Base) MCG/ACT inhaler Generic drug:  albuterol Inhale 2 puffs into the lungs every 6 (six) hours as needed for wheezing or shortness of breath.       Contact information for follow-up providers    Ahmed, Otila Back, MD. Schedule an appointment as soon as possible for a visit in 1 week(s).   Specialty:  Internal Medicine Contact information: [place]  [place] [id number]        [facility] up.   Why:  A representative from [facility] (formerly [facility] will contact you to arrange start date and time for your therapy. Contact information: [address] [place] [address] [phone]            Contact information for after-discharge care    [facility] Follow up.   Service:  Skilled Nursing Contact information: [place] [address] [phone]                 Allergies  Allergen Reactions  . Biaxin [Clarithromycin]     Liver enzymes elevated per pt  . Sulfa Antibiotics Diarrhea    Consultations:  Orthopedic surgery   Procedures/Studies: Dg Chest 1 View  Result Date: 03/04/2017 CLINICAL DATA:  77 year old female with pleural effusion. Status post paracentesis EXAM: CHEST 1 VIEW COMPARISON:  03/03/2017 FINDINGS: Cardiomediastinal silhouette unchanged. Partial obscuration of the left  heart border and the left hemidiaphragm. Hazy opacities at the lower lungs. Pleuroparenchymal thickening on the left. No pneumothorax. Calcifications of the aortic arch. TIPS stent of the upper abdomen again noted. IMPRESSION: Similar appearance of the chest x-ray with left greater than right pleural effusion and associated atelectasis/ consolidation. Electronically Signed   By: Corrie Mckusick D.O.   On: 03/04/2017 10:24   Dg Chest 2 View  Result Date: 03/03/2017 CLINICAL DATA:  Altered mental status EXAM: CHEST  2 VIEW COMPARISON:  January 14, 2017. FINDINGS: There is a the persistent moderate left pleural effusion. There is persistent consolidation throughout much of the left lower lobe and portions of the lingula. There is also new patchy consolidation in the right lower lobe. There is a minimal right pleural effusion. Lungs elsewhere are clear. Heart size and pulmonary vascularity are normal. No adenopathy. There is aortic atherosclerosis. No bone lesions. No adenopathy. A TIPS shunt is noted in the right upper quadrant. IMPRESSION: Pleural effusion on the left with left base consolidation, stable. New patchy airspace consolidation right base with minimal right pleural effusion. Stable cardiac silhouette. There is aortic atherosclerosis. Aortic Atherosclerosis (ICD10-I70.0). Electronically Signed   By: Lowella Grip III M.D.   On: 03/03/2017 11:40   Dg Lumbar Spine 2-3 Views  Result Date: 03/12/2017 CLINICAL DATA:  L1 and L2 kyphoplasty EXAM: LUMBAR SPINE - 2-3 VIEW; DG C-ARM 61-120 MIN COMPARISON:  MRI lumbar spine 01/03/201 FINDINGS: Vertebral augmentation changes are noted at L1 and L2. No complicating features. IMPRESSION: Vertebral augmentation changes at L1 and L2. Electronically Signed   By: Marijo Sanes M.D.   On: 03/12/2017 13:54   Dg Lumbar Spine 2-3 Views  Result Date: 03/05/2017 CLINICAL DATA:  Low back pain after feeling a pop. EXAM: LUMBAR SPINE - 2-3 VIEW COMPARISON:  CT abdomen  pelvis dated October 09, 2013. FINDINGS: Five lumbar type vertebral bodies. No acute fracture or subluxation. Unchanged compression deformity of L2. Remaining vertebral body heights are preserved. Focal kyphosis at L1-L2 is unchanged. Sagittal alignment is otherwise maintained. Intervertebral disc heights are maintained. Tip fistula again noted. Surgical clips in the left upper abdomen. Atherosclerotic vascular calcifications of the abdominal aorta. IMPRESSION: 1. No acute osseous abnormality. Unchanged chronic superior endplate compression deformity of L2. Electronically Signed   By: Titus Dubin M.D.   On: 03/05/2017 14:07   Mr Lumbar Spine Wo Contrast  Result Date: 03/06/2017 CLINICAL DATA:  Severe low  back pain. Compression fracture seen on recent x-ray. EXAM: MRI LUMBAR SPINE WITHOUT CONTRAST TECHNIQUE: Multiplanar, multisequence MR imaging of the lumbar spine was performed. No intravenous contrast was administered. COMPARISON:  CT abdomen 10/09/2013 FINDINGS: Segmentation:  Standard. Alignment:  Physiologic. Vertebrae: No discitis or aggressive osseous lesion. Chronic L1 vertebral body compression fracture with approximately 10% height loss. Chronic L2 vertebral body compression fracture with approximately 50% height loss and 3 mm of retropulsion of the superior posterior margin of the L2 vertebral body. Conus medullaris and cauda equina: Conus extends to the L1 level. Conus and cauda equina appear normal. Paraspinal and other soft tissues: No paraspinal abnormality. Abdominal ascites. Disc levels: Disc spaces: Disc spaces are maintained. T12-L1: No significant disc bulge. No evidence of neural foraminal stenosis. No central canal stenosis. L1-L2: Minimal broad-based disc bulge eccentric towards the left. No evidence of neural foraminal stenosis. No central canal stenosis. L2-L3: Minimal broad-based disc bulge. No evidence of neural foraminal stenosis. No central canal stenosis. Mild bilateral facet  arthropathy. L3-L4: Mild broad-based disc bulge. No evidence of neural foraminal stenosis. No central canal stenosis. Mild bilateral facet arthropathy. L4-L5: No significant disc bulge. No evidence of neural foraminal stenosis. No central canal stenosis. Mild bilateral facet arthropathy. L5-S1: No significant disc bulge. No evidence of neural foraminal stenosis. No central canal stenosis. IMPRESSION: 1. No acute osseous injury of the lumbar spine. 2. 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 Electronically Signed   By: Kathreen Devoid   On: 03/06/2017 12:35   US Paracentesis  Result Date: 03/04/2017 INDICATION: History of NASH cirrhosis with recurrent abdominal ascites. Status post TIPS on January 06, 2017. Request for diagnostic and therapeutic paracentesis. EXAM: ULTRASOUND GUIDED RIGHT LATERAL ABDOMEN PARACENTESIS MEDICATIONS: 1% Lidocaine = 15 mL COMPLICATIONS: None immediate. PROCEDURE: Informed written consent was obtained from the patient after a discussion of the risks, benefits and alternatives to treatment. A timeout was performed prior to the initiation of the procedure. Initial ultrasound scanning demonstrates a large amount of ascites within the right lateral abdomen. The right lateral abdomen was prepped and draped in the usual sterile fashion. 1% lidocaine with epinephrine was used for local anesthesia. Following this, a 19 gauge, 7-cm, Yueh catheter was introduced. An ultrasound image was saved for documentation purposes. The paracentesis was performed. The catheter was removed and a dressing was applied. The patient tolerated the procedure well without immediate post procedural complication. FINDINGS: A total of approximately 5.3 liters of clear yellow fluid was removed. Samples were sent to the laboratory as requested by the clinical team. IMPRESSION: Successful ultrasound-guided paracentesis yielding 5.3 liters of peritoneal fluid. Read by:  Gareth Eagle, PA-C Electronically Signed    By: Marybelle Killings M.D.   On: 03/04/2017 09:54   US Paracentesis  Result Date: 02/20/2017 INDICATION: Cirrhosis likely due to NASH EXAM: ULTRASOUND GUIDED THERAPEUTIC PARACENTESIS MEDICATIONS: None. COMPLICATIONS: None immediate. PROCEDURE: Procedure, benefits, and risks of procedure were discussed with patient. Written informed consent for procedure was obtained. Time out protocol followed. Adequate collection of ascites localized by ultrasound in RIGHT lower quadrant. Skin prepped and draped in usual sterile fashion. Skin and soft tissues anesthetized with 10 mL of 1% lidocaine. 5 [place] Yueh catheter placed into peritoneal cavity. 6 L of bright yellow ascitic fluid aspirated by vacuum bottle suction. Procedure tolerated well by patient without immediate complication. FINDINGS: As above IMPRESSION: Successful ultrasound-guided paracentesis yielding 6 liters of peritoneal fluid. Electronically Signed   By: Lavonia Dana M.D.   On:  02/20/2017 13:52   Dg C-arm 1-60 Min  Result Date: 03/12/2017 CLINICAL DATA:  L1 and L2 kyphoplasty EXAM: LUMBAR SPINE - 2-3 VIEW; DG C-ARM 61-120 MIN COMPARISON:  MRI lumbar spine 01/03/201 FINDINGS: Vertebral augmentation changes are noted at L1 and L2. No complicating features. IMPRESSION: Vertebral augmentation changes at L1 and L2. Electronically Signed   By: Marijo Sanes M.D.   On: 03/12/2017 13:54   Dg C-arm 1-60 Min  Result Date: 03/12/2017 CLINICAL DATA:  L1 and L2 kyphoplasty EXAM: LUMBAR SPINE - 2-3 VIEW; DG C-ARM 61-120 MIN COMPARISON:  MRI lumbar spine 01/03/201 FINDINGS: Vertebral augmentation changes are noted at L1 and L2. No complicating features. IMPRESSION: Vertebral augmentation changes at L1 and L2. Electronically Signed   By: Marijo Sanes M.D.   On: 03/12/2017 13:54   US Abdominal Pelvic Art/vent Flow Doppler  Result Date: 02/18/2017 CLINICAL DATA:  77 year old female with NASH cirrhosis complicated by medically refractory large volume ascites. She  underwent creation of a TIPS on 01/06/2017 and presents today for scheduled follow-up. EXAM: DUPLEX ULTRASOUND OF LIVER AND TIPS SHUNT TECHNIQUE: Color and duplex Doppler ultrasound was performed to evaluate the hepatic in-flow and out-flow vessels. COMPARISON:  Most recent prior TIPS ultrasound 01/15/2017 FINDINGS: Portal Vein Velocities Main:  43 cm/sec, hepatopetal flow Right:  86 cm/sec, hepatopetal flow Left:  14 cm/sec, hepatofugal flow TIPS Stent Velocities Proximal:  86 cm/sec Mid:  148 Distal:  84 cm/sec Hepatic Vein Velocities Right:  84 cm/sec Mid:  16 cm/sec Left:  Unable to visualize. Splenic Vein: 38 centimeters/second Superior Mesenteric Vein: Not visualized Hepatic Artery: 82 centimeters/second Ascites: Moderate sonographically simple ascites. Varices: Absent. Other findings: Small, nodular liver consistent with the underlying history of cirrhosis. IMPRESSION: 1. Widely patent right hepatic to right portal venous TIPS. Reversed flow in the left hepatic vein is not unexpected. 2. Moderate ascites. Signed, Criselda Peaches, MD [facility] Electronically Signed   By: Jacqulynn Cadet M.D.   On: 02/18/2017 12:11   US Liver Doppler  Result Date: 03/04/2017 CLINICAL DATA:  Status post TIPS in November.  Evaluate TIPS. EXAM: DUPLEX ULTRASOUND OF LIVER AND TIPS SHUNT TECHNIQUE: Color and duplex Doppler ultrasound was performed to evaluate the hepatic in-flow and out-flow vessels. COMPARISON:  Duplex ultrasound of liver and TIPS shunt performed 02/18/2017 FINDINGS: Portal Vein Velocities Main:  49.1 cm/sec, hepatopetal flow Right:  44.3 cm/sec, hepatopetal flow Left:  16.8 cm/sec, hepatofugal flow TIPS Stent Velocities Proximal:  32.2 cm/sec Mid:  80.2 cm/sec Distal:  85.8 cm/sec Hepatic Vein Velocities Right:  41.0 cm/sec Mid:  25.0 cm/sec Left:  25.8 cm/sec Splenic Vein:  24.1 cm/sec Superior Mesenteric Vein: Not visualized. Hepatic Artery:   101 cm/sec Ascites: A moderate to large amount of sonographically simple ascites is noted within the abdomen. Varices: Absent. Other findings: A small nodular liver is again noted, compatible with underlying history of cirrhosis. IMPRESSION: 1. Widely patent right hepatic to right portal venous TIPS. 2. Moderate to large volume ascites. Electronically Signed   By: Garald Balding M.D.   On: 03/04/2017 05:43   Ir Radiologist Eval & Mgmt  Result Date: 02/18/2017 Please refer to notes tab for details about interventional procedure. (Op Note)   Discharge Exam: Vitals:   03/13/17 0041 03/13/17 0616  BP: (!) 112/49 (!) 105/43  Pulse: 60 61  Resp: 13 10  Temp: 97.8 F (36.6 C) 98 F (36.7 C)  SpO2: 93% 93%   Vitals:   03/12/17  1457 03/12/17 2041 03/13/17 0041 03/13/17 0616  BP: (!) 88/27 (!) 109/44 (!) 112/49 (!) 105/43  Pulse: 90 82 60 61  Resp: 18 13 13 10   Temp: 98 F (36.7 C) 97.6 F (36.4 C) 97.8 F (36.6 C) 98 F (36.7 C)  TempSrc: Oral Axillary Axillary Axillary  SpO2: 91% 97% 93% 93%  Weight:    65.3 kg (143 lb 15.4 oz)  Height:        General: Pt is alert, awake, not in acute distress Cardiovascular: RRR, S1/S2 +, no rubs, no gallops Respiratory: CTA bilaterally, no wheezing, no rhonchi Abdominal: Abdomen mild distended, umbilical hernia, Non tender, no fluid wave noted  Extremities: no edema Neuro: AAAx3 non focal   The results of significant diagnostics from this hospitalization (including imaging, microbiology, ancillary and laboratory) are listed below for reference.     Microbiology: Recent Results (from the past 240 hour(s))  Culture, body fluid-bottle     Status: None   Collection Time: 03/04/17 10:00 AM  Result Value Ref Range Status   Specimen Description PERITONEAL  Final   Special Requests NONE  Final   Culture NO GROWTH 5 DAYS  Final   Report Status 03/09/2017 FINAL  Final  Gram stain     Status: None   Collection Time: 03/04/17 10:00 AM  Result  Value Ref Range Status   Specimen Description PERITONEAL  Final   Special Requests NONE  Final   Gram Stain   Final    RARE WBC PRESENT, PREDOMINANTLY MONONUCLEAR NO ORGANISMS SEEN    Report Status 03/04/2017 FINAL  Final  Culture, Urine     Status: Abnormal   Collection Time: 03/04/17 10:30 AM  Result Value Ref Range Status   Specimen Description URINE, CLEAN CATCH  Final   Special Requests NONE  Final   Culture MULTIPLE SPECIES PRESENT, SUGGEST RECOLLECTION (A)  Final   Report Status 03/05/2017 FINAL  Final  Surgical pcr screen     Status: Abnormal   Collection Time: 03/11/17 11:41 PM  Result Value Ref Range Status   MRSA, PCR NEGATIVE NEGATIVE Final   Staphylococcus aureus POSITIVE (A) NEGATIVE Final    Comment: (NOTE) The Xpert SA Assay (FDA approved for NASAL specimens in patients 56 years of age and older), is one component of a comprehensive surveillance program. It is not intended to diagnose infection nor to guide or monitor treatment.      Labs: BNP (last 3 results) Recent Labs    03/03/17 2136  BNP 580.9*   Basic Metabolic Panel: Recent Labs  Lab 03/07/17 0727  03/09/17 0420 03/10/17 0924 03/10/17 1542 03/11/17 0656 03/12/17 0715  NA 129*   < > 128* 126* 126* 128* 128*  K 4.6   < > 4.0 3.9 3.8 3.8 3.6  CL 102   < > 101 97* 98* 99* 99*  CO2 20*   < > 23 21* 20* 21* 21*  GLUCOSE 202*   < > 236* 319* 305* 185* 151*  BUN 14   < > 14 16 15 14 15   CREATININE 0.94   < > 0.85 0.91 0.81 0.73 0.84  CALCIUM 7.9*   < > 7.7* 7.8* 7.5* 7.9* 7.8*  MG 1.7  --   --   --   --   --  1.6*   < > = values in this interval not displayed.   Liver Function Tests: Recent Labs  Lab 03/12/17 0715 03/13/17 1007  AST 43* 46*  ALT 28  28  ALKPHOS 230* 253*  BILITOT 5.3* 4.9*  PROT 4.4* 4.8*  ALBUMIN 1.5* 1.8*   No results for input(s): LIPASE, AMYLASE in the last 168 hours. No results for input(s): AMMONIA in the last 168 hours. CBC: Recent Labs  Lab 03/09/17 0420  03/10/17 0924 03/11/17 0656 03/12/17 0715 03/12/17 1556 03/13/17 1007  WBC 8.4 9.4 7.3 6.8  --  10.2  NEUTROABS  --   --   --  4.5  --   --   HGB 10.5* 10.9* 9.9* 9.6*  --  10.4*  HCT 31.8* 32.6* 29.7* 28.2*  --  31.6*  MCV 91.6 91.8 91.7 91.6  --  94.0  PLT 88* 106* 92* 95* 137* 152   Cardiac Enzymes: No results for input(s): CKTOTAL, CKMB, CKMBINDEX, TROPONINI in the last 168 hours. BNP: Invalid input(s): POCBNP CBG: Recent Labs  Lab 03/12/17 1055 03/12/17 1342 03/12/17 2159 03/13/17 0606 03/13/17 1250  GLUCAP 152* 173* 170* 200* 214*   D-Dimer No results for input(s): DDIMER in the last 72 hours. Hgb A1c Recent Labs    03/10/17 1542  HGBA1C 4.8   Lipid Profile No results for input(s): CHOL, HDL, LDLCALC, TRIG, CHOLHDL, LDLDIRECT in the last 72 hours. Thyroid function studies No results for input(s): TSH, T4TOTAL, T3FREE, THYROIDAB in the last 72 hours.  Invalid input(s): FREET3 Anemia work up No results for input(s): VITAMINB12, FOLATE, FERRITIN, TIBC, IRON, RETICCTPCT in the last 72 hours. Urinalysis    Component Value Date/Time   COLORURINE AMBER (A) 03/03/2017 1844   APPEARANCEUR CLEAR 03/03/2017 1844   LABSPEC 1.025 03/03/2017 1844   PHURINE 5.0 03/03/2017 1844   GLUCOSEU NEGATIVE 03/03/2017 1844   HGBUR NEGATIVE 03/03/2017 1844   BILIRUBINUR NEGATIVE 03/03/2017 1844   KETONESUR NEGATIVE 03/03/2017 1844   PROTEINUR NEGATIVE 03/03/2017 1844   UROBILINOGEN 0.2 10/08/2013 1746   NITRITE NEGATIVE 03/03/2017 1844   LEUKOCYTESUR TRACE (A) 03/03/2017 1844   Sepsis Labs Invalid input(s): PROCALCITONIN,  WBC,  LACTICIDVEN Microbiology Recent Results (from the past 240 hour(s))  Culture, body fluid-bottle     Status: None   Collection Time: 03/04/17 10:00 AM  Result Value Ref Range Status   Specimen Description PERITONEAL  Final   Special Requests NONE  Final   Culture NO GROWTH 5 DAYS  Final   Report Status 03/09/2017 FINAL  Final  Gram stain      Status: None   Collection Time: 03/04/17 10:00 AM  Result Value Ref Range Status   Specimen Description PERITONEAL  Final   Special Requests NONE  Final   Gram Stain   Final    RARE WBC PRESENT, PREDOMINANTLY MONONUCLEAR NO ORGANISMS SEEN    Report Status 03/04/2017 FINAL  Final  Culture, Urine     Status: Abnormal   Collection Time: 03/04/17 10:30 AM  Result Value Ref Range Status   Specimen Description URINE, CLEAN CATCH  Final   Special Requests NONE  Final   Culture MULTIPLE SPECIES PRESENT, SUGGEST RECOLLECTION (A)  Final   Report Status 03/05/2017 FINAL  Final  Surgical pcr screen     Status: Abnormal   Collection Time: 03/11/17 11:41 PM  Result Value Ref Range Status   MRSA, PCR NEGATIVE NEGATIVE Final   Staphylococcus aureus POSITIVE (A) NEGATIVE Final    Comment: (NOTE) The Xpert SA Assay (FDA approved for NASAL specimens in patients 45 years of age and older), is one component of a comprehensive surveillance program. It is not intended to diagnose infection nor  to guide or monitor treatment.     Time coordinating discharge: 35 minutes  SIGNED:  Chipper Oman, MD  [facility] 03/13/2017, 3:39 PM  Pager please text page via  [email or web address]

## 2017-03-13 NOTE — Progress Notes (Signed)
    Subjective: 1 Day Post-Op Procedure(s) (LRB): KYPHOPLASTY Lumbar one and Lumbar two  (N/A) Patient reports pain as 3 on 0-10 scale.   Denies CP or SOB.  Voiding without difficulty. Positive flatus. Objective: Vital signs in last 24 hours: Temp:  [97.3 F (36.3 C)-98 F (36.7 C)] 98 F (36.7 C) (01/10 0616) Pulse Rate:  [58-90] 61 (01/10 0616) Resp:  [10-20] 10 (01/10 0616) BP: (88-114)/(27-49) 105/43 (01/10 0616) SpO2:  [91 %-99 %] 93 % (01/10 0616) Weight:  [65.3 kg (143 lb 15.4 oz)] 65.3 kg (143 lb 15.4 oz) (01/10 0616)  Intake/Output from previous day: 01/09 0701 - 01/10 0700 In: 2615.8 [P.O.:180; I.V.:1961.8; Blood:199; IV Piggyback:200] Out: 330 [Urine:300; Blood:30] Intake/Output this shift: No intake/output data recorded.  Labs: Recent Labs    03/10/17 0924 03/11/17 0656 03/12/17 0715  HGB 10.9* 9.9* 9.6*   Recent Labs    03/11/17 0656 03/12/17 0715 03/12/17 1556  WBC 7.3 6.8  --   RBC 3.24* 3.08*  --   HCT 29.7* 28.2*  --   PLT 92* 95* 137*   Recent Labs    03/11/17 0656 03/12/17 0715  NA 128* 128*  K 3.8 3.6  CL 99* 99*  CO2 21* 21*  BUN 14 15  CREATININE 0.73 0.84  GLUCOSE 185* 151*  CALCIUM 7.9* 7.8*   Recent Labs    03/10/17 1815  INR 1.37    Physical Exam: ABD soft Sensation intact distally Dorsiflexion/Plantar flexion intact Incision: scant drainage Compartment soft Body mass index is 26.33 kg/m.   Assessment/Plan: 1 Day Post-Op Procedure(s) (LRB): KYPHOPLASTY Lumbar one and Lumbar two  (N/A) Continue management from [facility], Darla Lesches for Dr. Melina Schools [facility] [phone] 03/13/2017, 7:41 AM

## 2017-03-13 NOTE — Progress Notes (Signed)
Report called to Santiago Glad at [facility]. All questions/concerns addressed. IV's removed, catheter tips intact. Awaiting PTAR for transport to SNF.

## 2017-03-13 NOTE — Progress Notes (Addendum)
Physical Therapy Treatment Patient Details Name: Cynthia Long MRN: [id number] DOB: [birth year] Today's Date: 03/13/2017    History of Present Illness Pt is a 63 y/ofemalewith a PMH ofnonalcoholic steatohepatitis,DM,gastritis, esophageal varices.She presented to the emergency department secondary to 3-day history of worsening confusion. Dx hepatic encephalopathy, L1 and L2 compression fxs. Kyphoplasty 03/12/17    PT Comments    Patient tolerated short distance gait in room but limited by dizziness. Pt required min/mod A for safe ambulation and to navigate environment. Given pt's current mobility level recommending SNF for further skilled PT services to maximize independence and safety with mobility.    Follow Up Recommendations  SNF;Supervision for mobility/OOB     Equipment Recommendations  None recommended by PT    Recommendations for Other Services       Precautions / Restrictions Precautions Precautions: Fall    Mobility  Bed Mobility Overal bed mobility: Needs Assistance Bed Mobility: Rolling;Sidelying to Sit Rolling: Min guard Sidelying to sit: Min guard       General bed mobility comments: min guard for safety; pt performed log roll technique with rail and without cues  Transfers Overall transfer level: Needs assistance Equipment used: Rolling walker (2 wheeled) Transfers: Sit to/from Stand Sit to Stand: Min assist         General transfer comment: assist for balance upon standing; cues for safe hand placement  Ambulation/Gait Ambulation/Gait assistance: Min assist;Mod assist Ambulation Distance (Feet): 22 Feet Assistive device: Rolling walker (2 wheeled) Gait Pattern/deviations: Step-through pattern;Decreased stride length Gait velocity: Decreased   General Gait Details: cues for posture and safe use of AD; assist to navigate environment as pt was running into objects on the way from room door to recliner   Stairs            Wheelchair  Mobility    Modified Rankin (Stroke Patients Only)       Balance Overall balance assessment: Needs assistance Sitting-balance support: Feet supported;No upper extremity supported Sitting balance-Leahy Scale: Fair     Standing balance support: Bilateral upper extremity supported;During functional activity Standing balance-Leahy Scale: Poor Standing balance comment: Reliant on RW for support                            Cognition Arousal/Alertness: Awake/alert Behavior During Therapy: Flat affect Overall Cognitive Status: Within Functional Limits for tasks assessed                                        Exercises      General Comments General comments (skin integrity, edema, etc.): pt c/o dizziness while ambulating; BP upon sitting 126/42 (61), pulse 53, and SpO2 95% on RA-RN notified of BP      Pertinent Vitals/Pain Pain Assessment: Faces Faces Pain Scale: Hurts little more Pain Location: back Pain Descriptors / Indicators: Guarding;Sore;Moaning Pain Intervention(s): Limited activity within patient's tolerance;Monitored during session;Repositioned    Home Living                      Prior Function            PT Goals (current goals can now be found in the care plan section) Acute Rehab PT Goals PT Goal Formulation: With patient Time For Goal Achievement: 03/18/17 Potential to Achieve Goals: Fair Progress towards PT goals: Not progressing toward goals - comment(limited by dizziness)  Frequency    Min 3X/week      PT Plan Discharge plan needs to be updated    Co-evaluation              AM-PAC PT "6 Clicks" Daily Activity  Outcome Measure  Difficulty turning over in bed (including adjusting bedclothes, sheets and blankets)?: Unable Difficulty moving from lying on back to sitting on the side of the bed? : Unable Difficulty sitting down on and standing up from a chair with arms (e.g., wheelchair, bedside commode,  etc,.)?: Unable Help needed moving to and from a bed to chair (including a wheelchair)?: A Little Help needed walking in hospital room?: A Lot Help needed climbing 3-5 steps with a railing? : Total 6 Click Score: 9    End of Session Equipment Utilized During Treatment: Gait belt Activity Tolerance: Patient tolerated treatment well Patient left: with call bell/phone within reach;in chair Nurse Communication: Mobility status PT Visit Diagnosis: Unsteadiness on feet (R26.81);Pain;Other abnormalities of gait and mobility (R26.89) Pain - Right/Left: (Bilateral low back) Pain - part of body: (back)     Time: 4540-9811 PT Time Calculation (min) (ACUTE ONLY): 23 min  Charges:  $Gait Training: 8-22 mins $Therapeutic Activity: 8-22 mins                    G Codes:       Earney Navy, PTA Pager: [phone]     Darliss Cheney 03/13/2017, 1:28 PM

## 2017-03-13 NOTE — Progress Notes (Signed)
Patient discharged at this time via stretcher accompanied by 2 paramedics. Patient alert and oriented to self and place prior to discharge. Denied chest pain and no respiratory distress. Paramedics made aware of patient history and current medical issues. Vital signs checked. Daughter made aware of transfer to another facility.

## 2017-03-13 NOTE — Clinical Social Work Placement (Signed)
   CLINICAL SOCIAL WORK PLACEMENT  NOTE  Date:  03/13/2017  Patient Details  Name: Cynthia Long MRN: [id number] Date of Birth: 08-14-1940  Clinical Social Work is seeking post-discharge placement for this patient at the [place] level of care (*CSW will initial, date and re-position this form in  chart as items are completed):  Yes   Patient/family provided with [facility] Work Department's list of facilities offering this level of care within the geographic area requested by the patient (or if unable, by the patient's family).  Yes   Patient/family informed of their freedom to choose among providers that offer the needed level of care, that participate in Medicare, Medicaid or managed care program needed by the patient, have an available bed and are willing to accept the patient.  Yes   Patient/family informed of [facility]'s ownership interest in [facility] and [facility], as well as of the fact that they are under no obligation to receive care at these facilities.  PASRR submitted to EDS on       PASRR number received on       Existing PASRR number confirmed on       FL2 transmitted to all facilities in geographic area requested by pt/family on       FL2 transmitted to all facilities within larger geographic area on       Patient informed that his/her managed care company has contracts with or will negotiate with certain facilities, including the following:        Yes   Patient/family informed of bed offers received.  Patient chooses bed at [facility]     Physician recommends and patient chooses bed at      Patient to be transferred to [facility] on 03/13/17.  Patient to be transferred to facility by PTAR     Patient family notified on 03/13/17 of transfer.  Name of family member notified:  son advised of discharge     PHYSICIAN Please prepare priority discharge summary, including  medications, Please prepare prescriptions     Additional Comment:    _______________________________________________ Normajean Baxter, LCSW 03/13/2017, 3:41 PM

## 2017-03-25 NOTE — Addendum Note (Signed)
Addendum  created 03/25/17 1849 by Effie Berkshire, MD   [facility]raprocedure Staff edited

## 2017-04-03 ENCOUNTER — Ambulatory Visit: Payer: Medicare Other | Admitting: Internal Medicine

## 2017-04-04 DEATH — deceased

## 2018-03-22 IMAGING — US IR PARACENTESIS
1 series · 2 of 2 positions shown · non-contrast
Comparison: none

INDICATION: Cirrhosis likely due to NASH, ascites. Request for therapeutic
paracentesis.

[Series 1: ir (id) (id)/(id)/(id) ir · 2 of 2 slices shown]
[im 1/2]
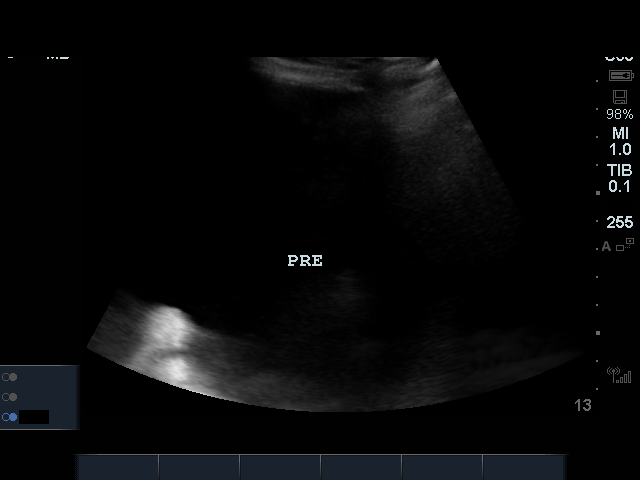
[im 2/2]
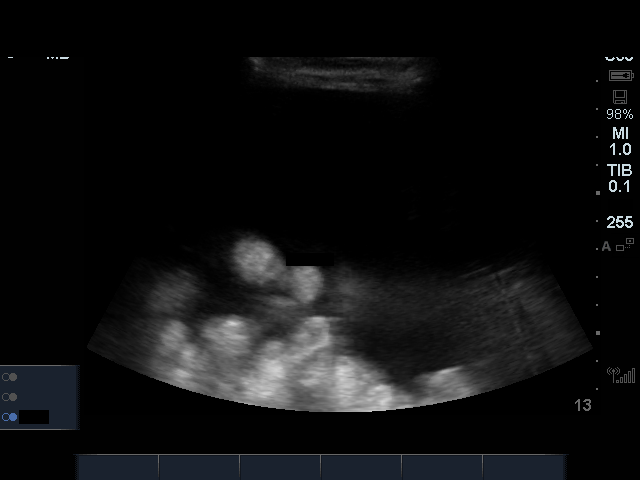

[2 of 2 positions shown; findings below may reference images not displayed]

EXAM:
ULTRASOUND GUIDED LEFT LATERAL ABDOMEN PARACENTESIS

MEDICATIONS:
1% Lidocaine = 10 mL

COMPLICATIONS:
None immediate.

PROCEDURE:
Informed written consent was obtained from the patient after a
discussion of the risks, benefits and alternatives to treatment. A
timeout was performed prior to the initiation of the procedure.

Initial ultrasound scanning demonstrates a large amount of ascites
within the left lateral abdomen. The left lateral abdomen was
prepped and draped in the usual sterile fashion. 1% lidocaine with
epinephrine was used for local anesthesia.

Following this, a 19 gauge, 7-cm, Yueh catheter was introduced. An
ultrasound image was saved for documentation purposes. The
paracentesis was performed. The catheter was removed and a dressing
was applied. The patient tolerated the procedure well without
immediate post procedural complication.
FINDINGS: A total of approximately 6 liters of clear yellow fluid was removed.
Samples were sent to the laboratory as requested by the clinical
team.
IMPRESSION: Successful ultrasound-guided paracentesis yielding 6 liters of
peritoneal fluid.

## 2019-05-29 IMAGING — US US ART/VEN ABD/PELV/SCROTUM DOPPLER COMPLETE
1 series · 13 of 25 positions shown · non-contrast
Comparison: Most recent prior TIPS ultrasound 01/15/2017

CLINICAL DATA: 76-year-old female with NASH cirrhosis complicated
by medically refractory large volume ascites. She underwent creation
of a TIPS on 01/06/2017 and presents today for scheduled follow-up.

EXAM:
DUPLEX ULTRASOUND OF LIVER AND TIPS SHUNT
TECHNIQUE: Color and duplex Doppler ultrasound was performed to evaluate the
hepatic in-flow and out-flow vessels.

[Series 1: us art/ven abd/pelv/scrotum doppler complete · 0.23mm/px · 13 of 29 slices shown]
[im 1/29]
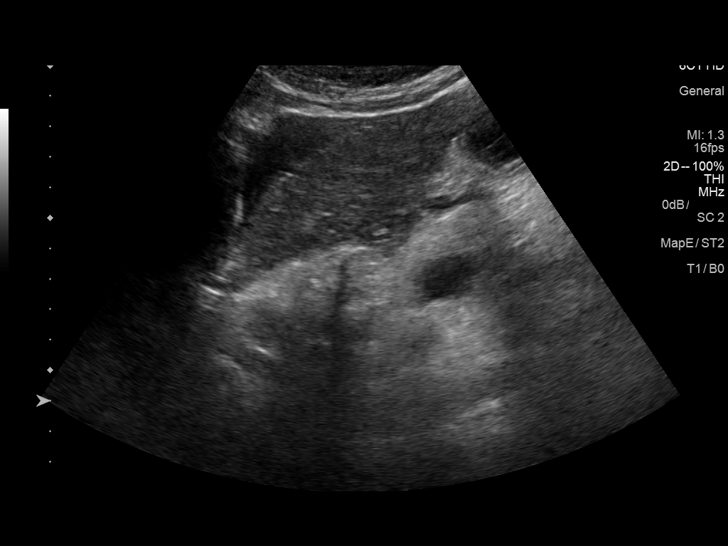
[im 3/29]
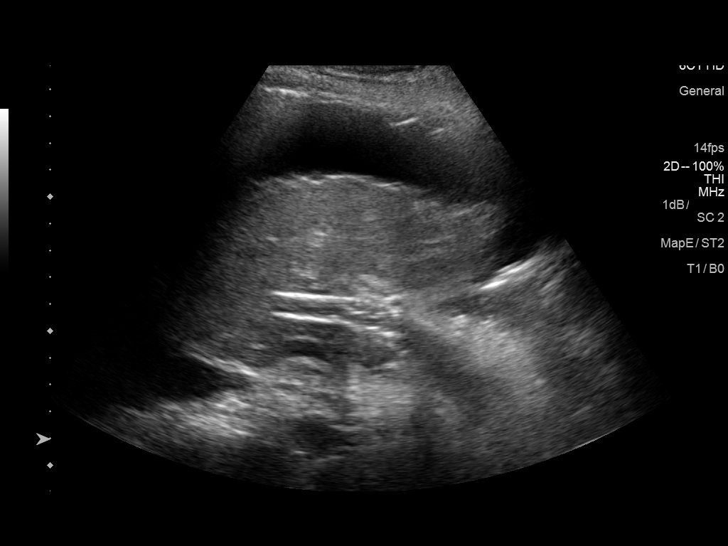
[im 5/29]
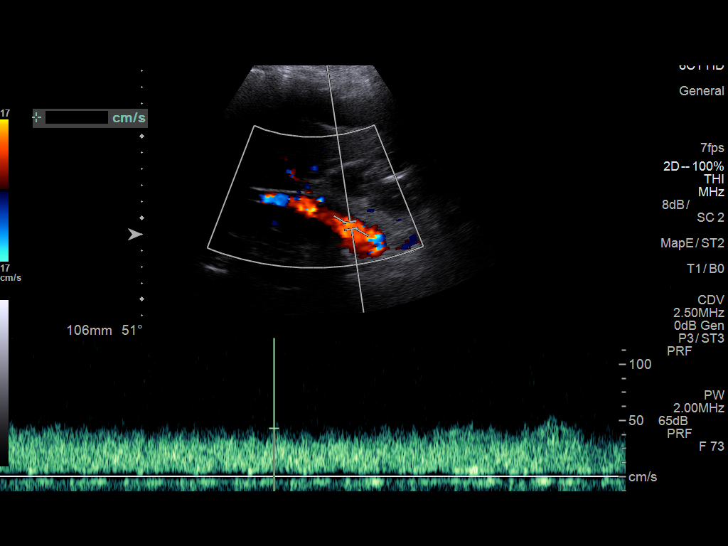
[im 8/29]
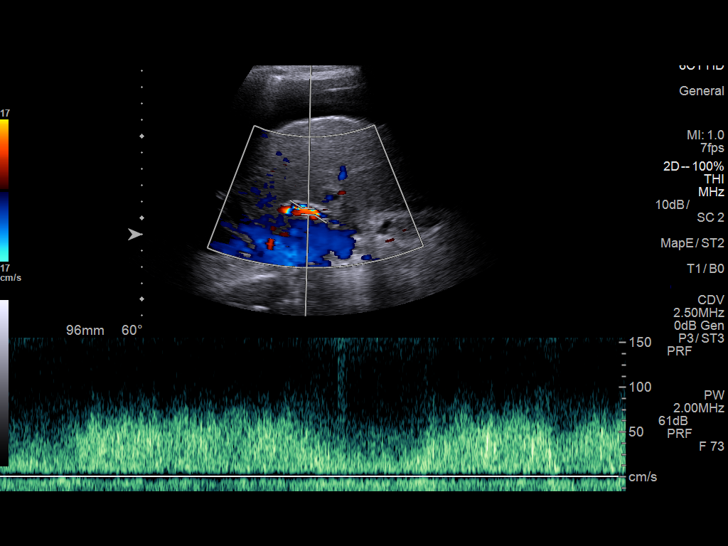
[im 10/29]
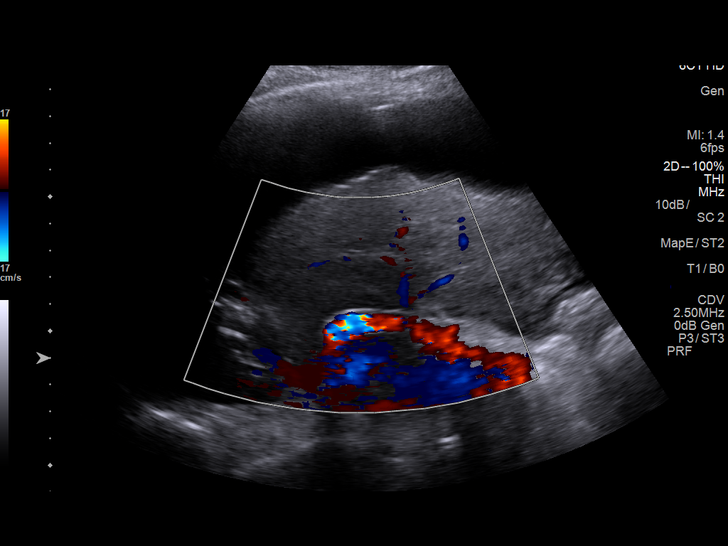
[im 12/29]
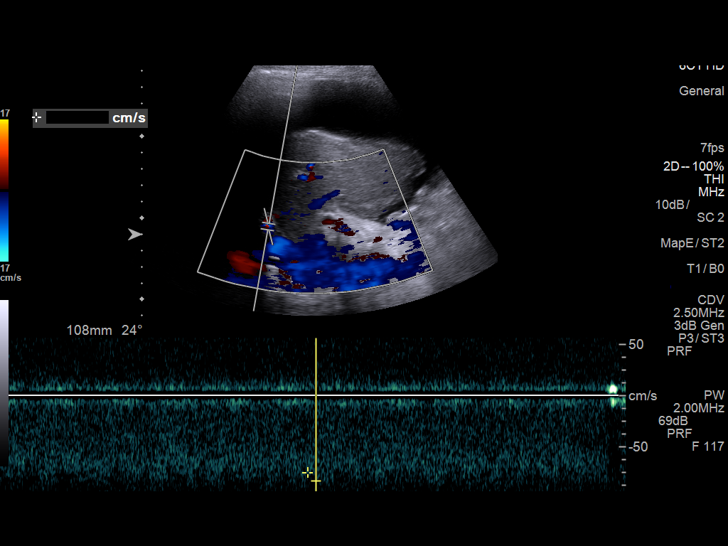
[im 15/29]
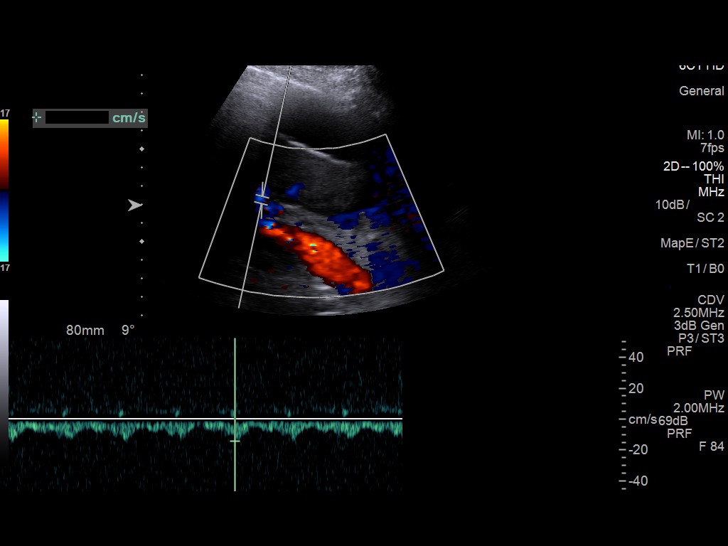
[im 17/29]
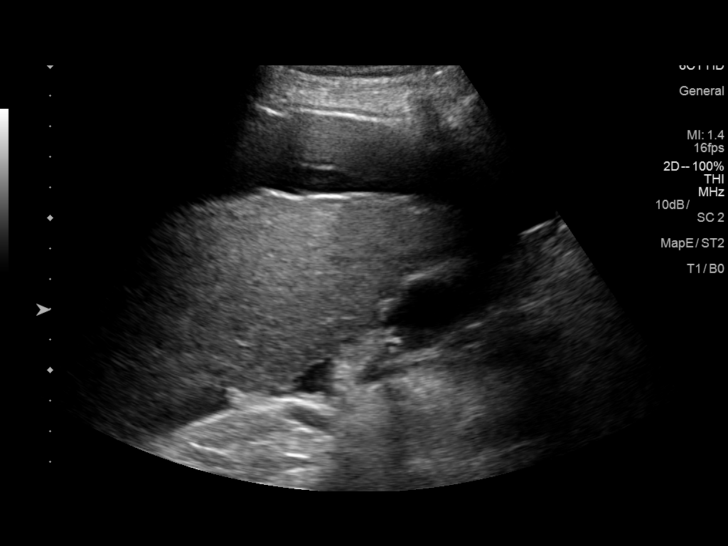
[im 19/29]
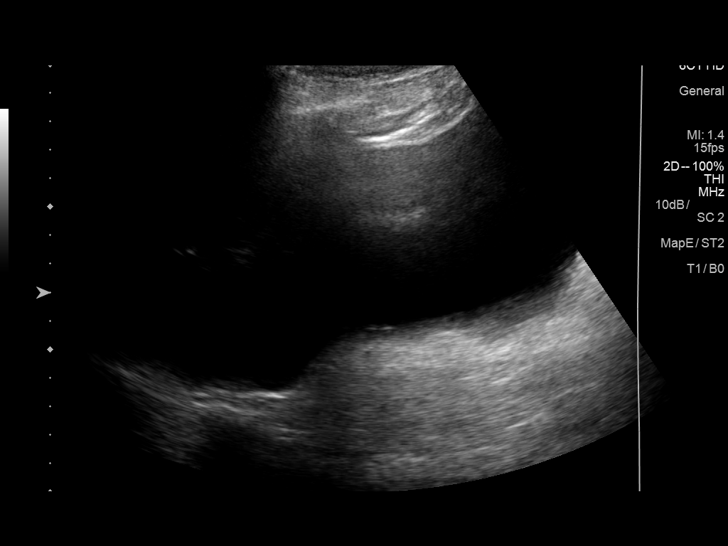
[im 22/29]
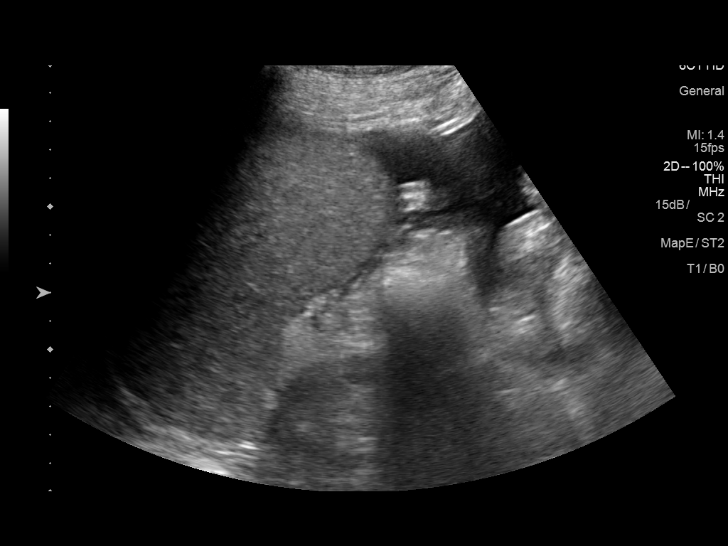
[im 24/29]
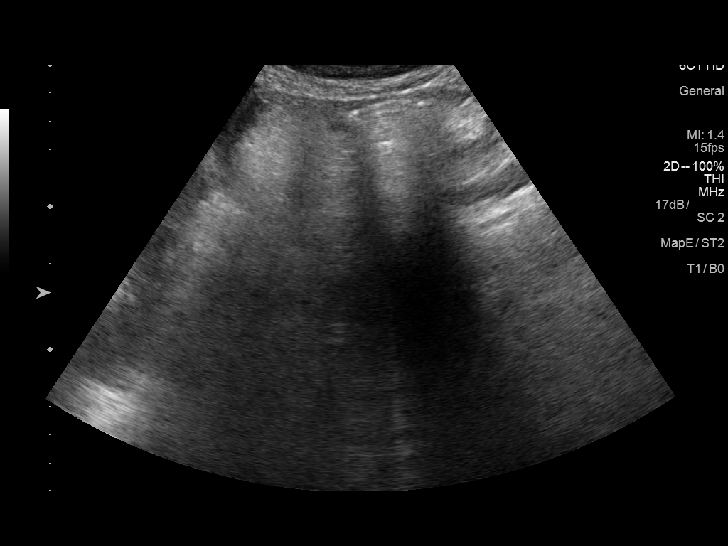
[im 26/29]
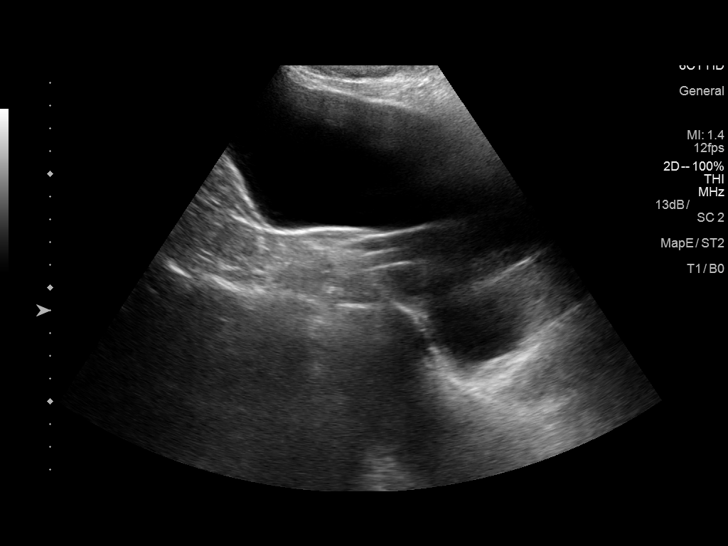
[im 29/29]
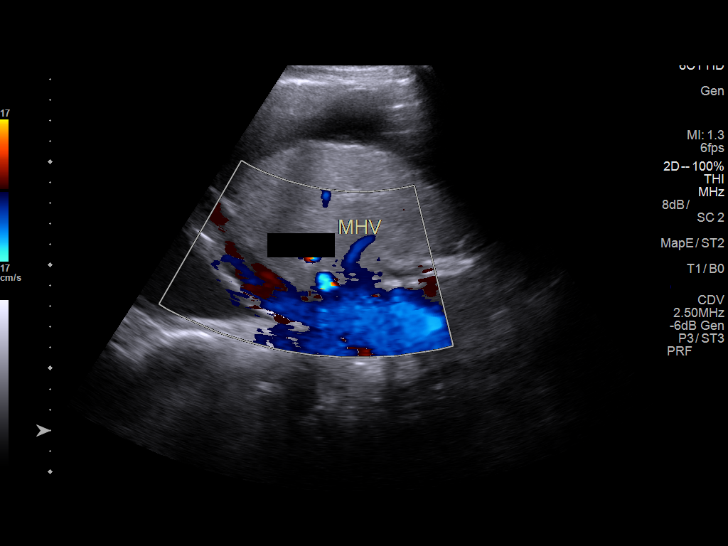

[13 of 25 positions shown; findings below may reference images not displayed]

FINDINGS: Portal Vein Velocities

Main:  43 cm/sec, hepatopetal flow

Right:  86 cm/sec, hepatopetal flow

Left:  14 cm/sec, hepatofugal flow

TIPS Stent Velocities

Proximal:  86 cm/sec

Mid:  148

Distal:  84 cm/sec

Hepatic Vein Velocities

Right:  84 cm/sec

Mid:  16 cm/sec

Left:  Unable to visualize.

Splenic Vein: 38 centimeters/second

Superior Mesenteric Vein: Not visualized

Hepatic Artery: 82 centimeters/second

Ascites: Moderate sonographically simple ascites.

Varices: Absent.

Other findings: Small, nodular liver consistent with the underlying
history of cirrhosis.
IMPRESSION: 1. Widely patent right hepatic to right portal venous TIPS. Reversed
flow in the left hepatic vein is not unexpected.
2. Moderate ascites.

## 2019-06-12 IMAGING — US US HEPATIC LIVER DOPPLER
1 series · 14 of 25 positions shown · non-contrast
Comparison: Duplex ultrasound of liver and TIPS shunt performed
02/18/2017

CLINICAL DATA: Status post TIPS in [REDACTED].  Evaluate TIPS.

EXAM:
DUPLEX ULTRASOUND OF LIVER AND TIPS SHUNT
TECHNIQUE: Color and duplex Doppler ultrasound was performed to evaluate the
hepatic in-flow and out-flow vessels.

[Series 1: us hepatic liver doppler · 0.22mm/px · 14 of 42 slices shown]
[im 1/42]
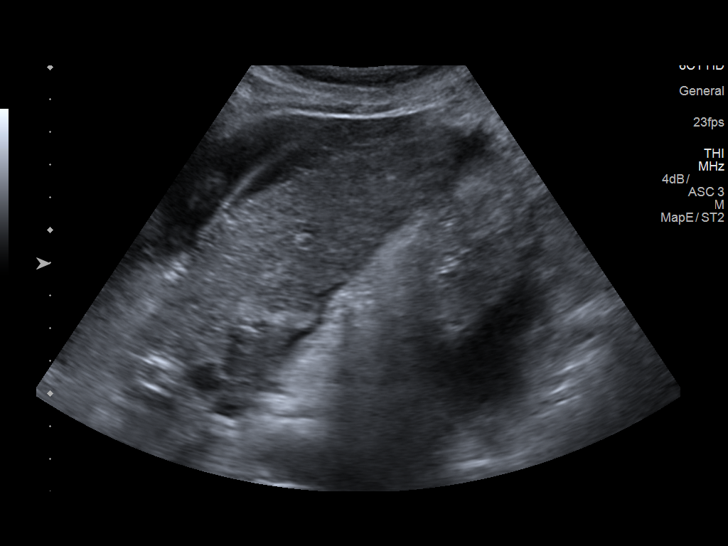
[im 4/42]
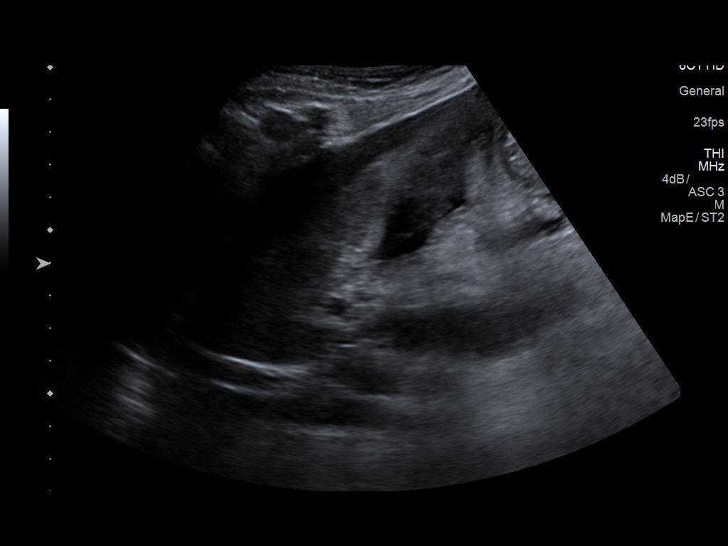
[im 7/42]
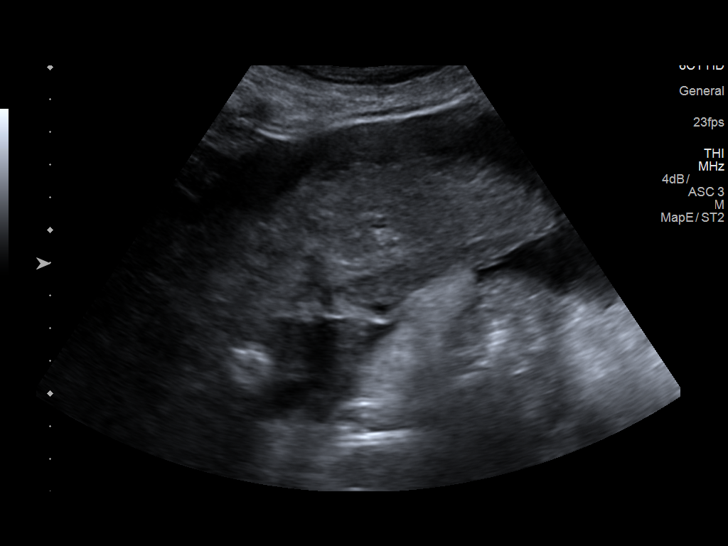
[im 11/42]
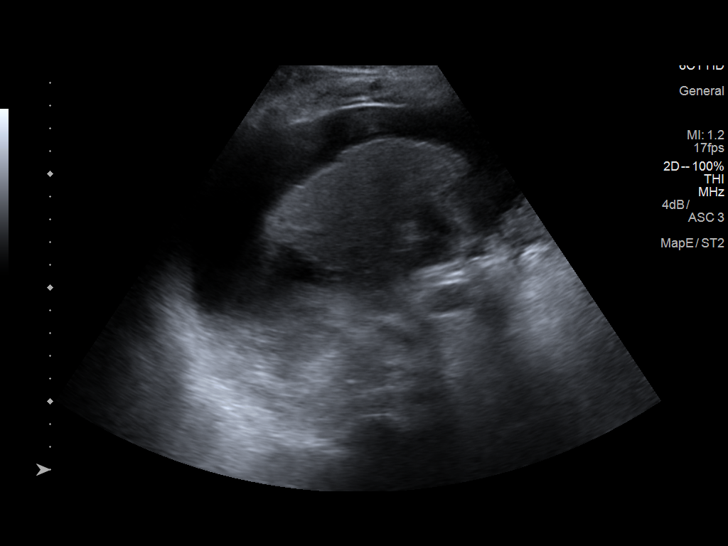
[im 14/42]
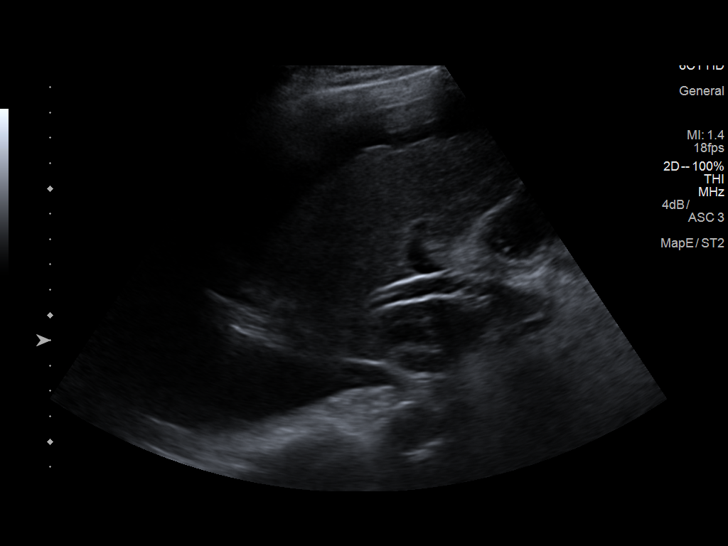
[im 16/42]
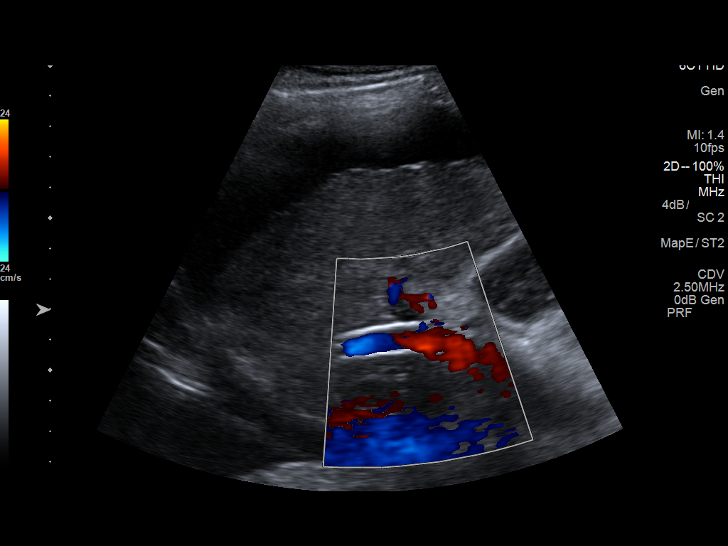
[im 19/42]
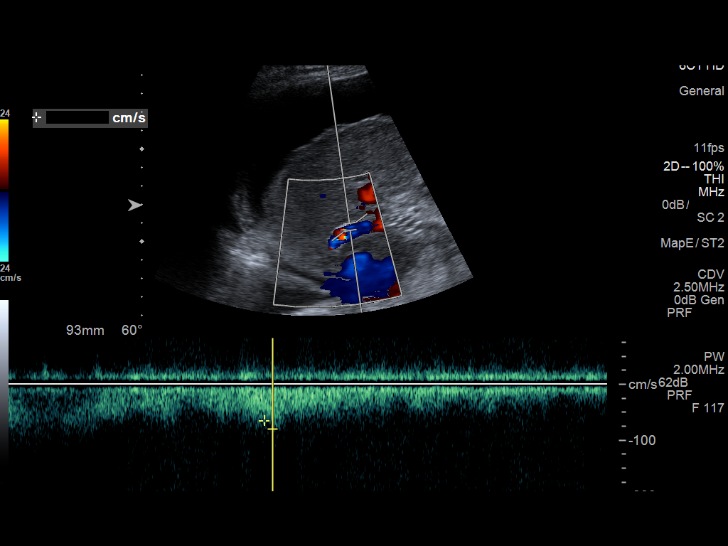
[im 23/42]
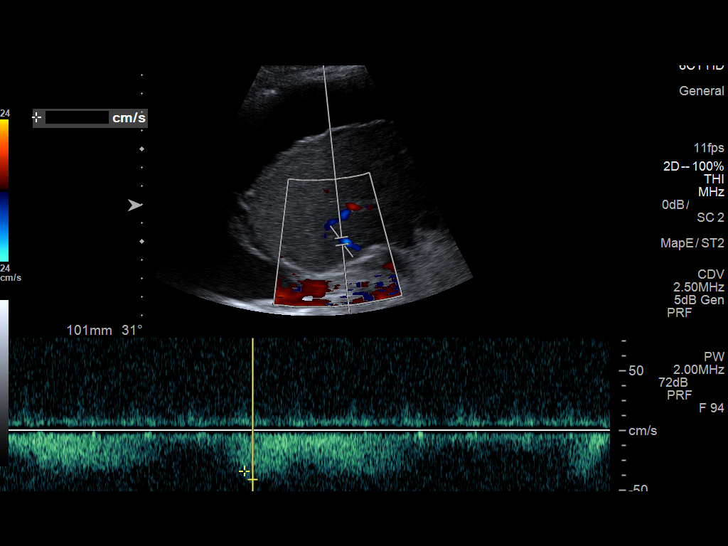
[im 26/42]
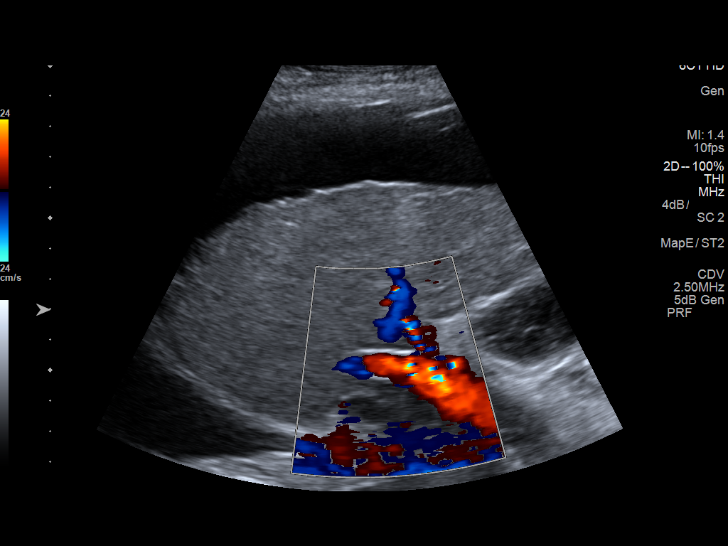
[im 28/42]
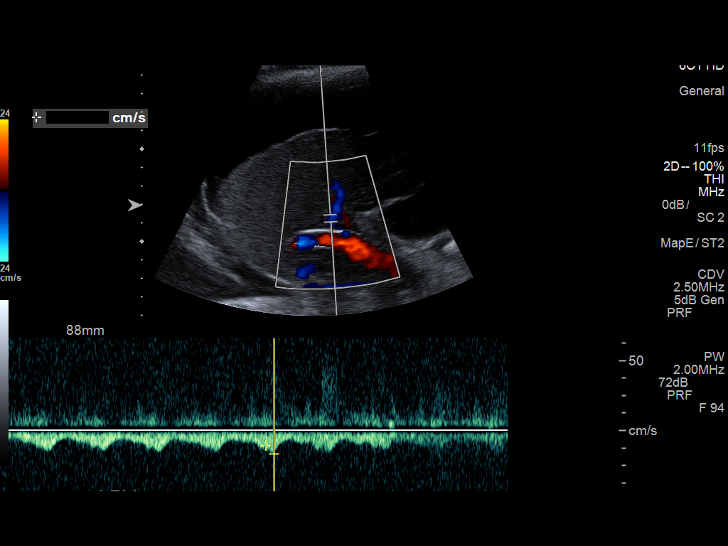
[im 31/42]
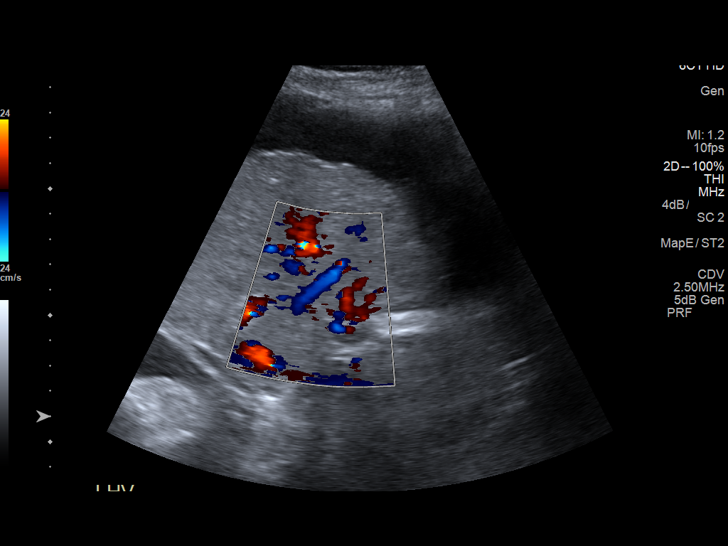
[im 35/42]
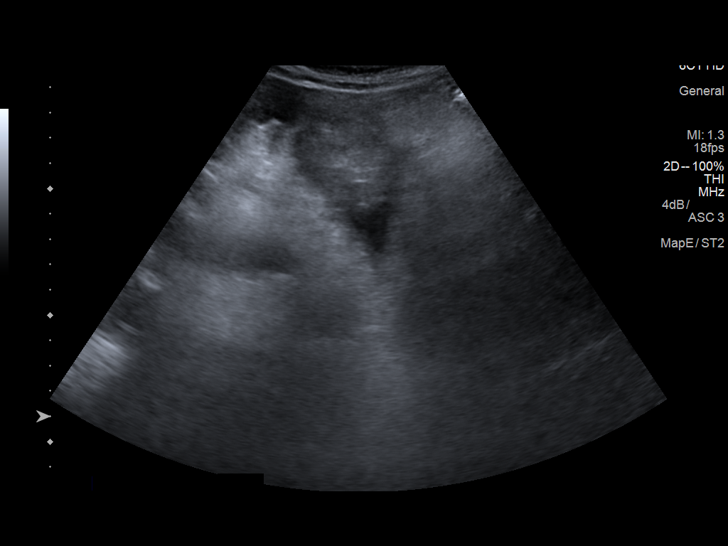
[im 38/42]
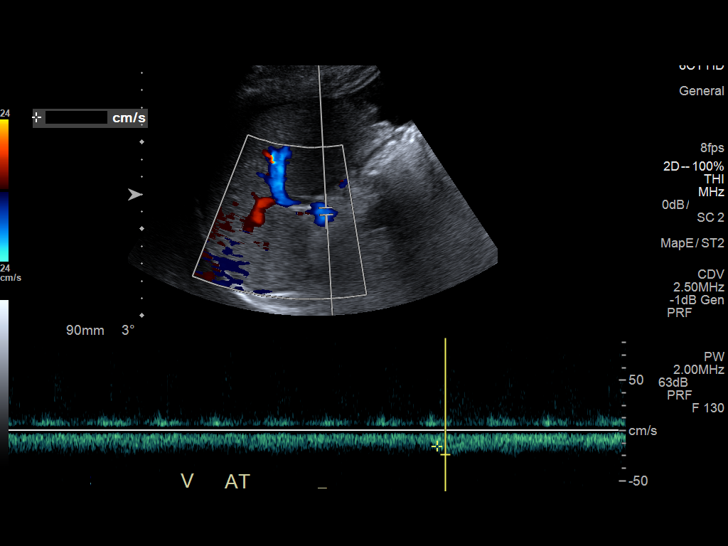
[im 42/42]
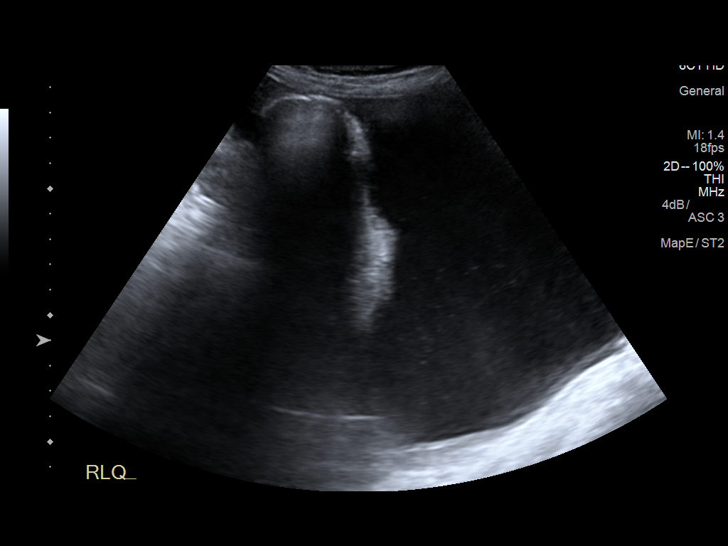

[14 of 25 positions shown; findings below may reference images not displayed]

FINDINGS: Portal Vein Velocities

Main:  49.1 cm/sec, hepatopetal flow

Right:  44.3 cm/sec, hepatopetal flow

Left:  16.8 cm/sec, hepatofugal flow

TIPS Stent Velocities

Proximal:  32.2 cm/sec

Mid:  80.2 cm/sec

Distal:  85.8 cm/sec

Hepatic Vein Velocities

Right:  41.0 cm/sec

Mid:  25.0 cm/sec

Left:  25.8 cm/sec

Splenic Vein:  24.1 cm/sec

Superior Mesenteric Vein: Not visualized.

Hepatic Artery:  101 cm/sec

Ascites: A moderate to large amount of sonographically simple
ascites is noted within the abdomen.

Varices: Absent.

Other findings: A small nodular liver is again noted, compatible
with underlying history of cirrhosis.
IMPRESSION: 1. Widely patent right hepatic to right portal venous TIPS.
2. Moderate to large volume ascites.
